# Patient Record
Sex: Female | Born: 1935 | Race: White | Hispanic: No | Smoking: Never smoker
Health system: Southern US, Community
[De-identification: ages and names within clinical notes are randomized; demographics above are authoritative.]

## PROBLEM LIST (undated history)

## (undated) DIAGNOSIS — N183 Chronic kidney disease, stage 3 unspecified: Secondary | ICD-10-CM

## (undated) DIAGNOSIS — R0602 Shortness of breath: Secondary | ICD-10-CM

## (undated) DIAGNOSIS — E782 Mixed hyperlipidemia: Secondary | ICD-10-CM

## (undated) DIAGNOSIS — M51369 Other intervertebral disc degeneration, lumbar region without mention of lumbar back pain or lower extremity pain: Secondary | ICD-10-CM

## (undated) DIAGNOSIS — M5136 Other intervertebral disc degeneration, lumbar region: Secondary | ICD-10-CM

## (undated) DIAGNOSIS — Z8679 Personal history of other diseases of the circulatory system: Secondary | ICD-10-CM

## (undated) DIAGNOSIS — D6851 Activated protein C resistance: Secondary | ICD-10-CM

## (undated) DIAGNOSIS — K589 Irritable bowel syndrome without diarrhea: Secondary | ICD-10-CM

## (undated) DIAGNOSIS — Z9889 Other specified postprocedural states: Secondary | ICD-10-CM

## (undated) DIAGNOSIS — B009 Herpesviral infection, unspecified: Secondary | ICD-10-CM

## (undated) DIAGNOSIS — D649 Anemia, unspecified: Secondary | ICD-10-CM

## (undated) DIAGNOSIS — M8580 Other specified disorders of bone density and structure, unspecified site: Secondary | ICD-10-CM

## (undated) DIAGNOSIS — K5790 Diverticulosis of intestine, part unspecified, without perforation or abscess without bleeding: Secondary | ICD-10-CM

## (undated) DIAGNOSIS — M48061 Spinal stenosis, lumbar region without neurogenic claudication: Secondary | ICD-10-CM

## (undated) DIAGNOSIS — I10 Essential (primary) hypertension: Secondary | ICD-10-CM

## (undated) DIAGNOSIS — K219 Gastro-esophageal reflux disease without esophagitis: Secondary | ICD-10-CM

## (undated) DIAGNOSIS — M1990 Unspecified osteoarthritis, unspecified site: Secondary | ICD-10-CM

## (undated) DIAGNOSIS — K449 Diaphragmatic hernia without obstruction or gangrene: Secondary | ICD-10-CM

## (undated) DIAGNOSIS — R7303 Prediabetes: Secondary | ICD-10-CM

## (undated) DIAGNOSIS — R112 Nausea with vomiting, unspecified: Secondary | ICD-10-CM

## (undated) DIAGNOSIS — R42 Dizziness and giddiness: Secondary | ICD-10-CM

## (undated) DIAGNOSIS — R0600 Dyspnea, unspecified: Secondary | ICD-10-CM

## (undated) DIAGNOSIS — Z862 Personal history of diseases of the blood and blood-forming organs and certain disorders involving the immune mechanism: Secondary | ICD-10-CM

## (undated) HISTORY — PX: EYE SURGERY: SHX253

## (undated) HISTORY — PX: COLONOSCOPY: SHX174

## (undated) HISTORY — DX: Unspecified osteoarthritis, unspecified site: M19.90

## (undated) HISTORY — DX: Personal history of other diseases of the circulatory system: Z86.79

## (undated) HISTORY — DX: Other intervertebral disc degeneration, lumbar region without mention of lumbar back pain or lower extremity pain: M51.369

## (undated) HISTORY — DX: Shortness of breath: R06.02

## (undated) HISTORY — DX: Personal history of diseases of the blood and blood-forming organs and certain disorders involving the immune mechanism: Z86.2

## (undated) HISTORY — DX: Essential (primary) hypertension: I10

## (undated) HISTORY — DX: Other specified disorders of bone density and structure, unspecified site: M85.80

## (undated) HISTORY — DX: Diverticulosis of intestine, part unspecified, without perforation or abscess without bleeding: K57.90

## (undated) HISTORY — PX: TOTAL KNEE ARTHROPLASTY: SHX125

## (undated) HISTORY — DX: Diaphragmatic hernia without obstruction or gangrene: K44.9

## (undated) HISTORY — DX: Dizziness and giddiness: R42

## (undated) HISTORY — DX: Chronic kidney disease, stage 3 unspecified: N18.30

## (undated) HISTORY — DX: Spinal stenosis, lumbar region without neurogenic claudication: M48.061

## (undated) HISTORY — DX: Herpesviral infection, unspecified: B00.9

## (undated) HISTORY — DX: Gastro-esophageal reflux disease without esophagitis: K21.9

## (undated) HISTORY — DX: Mixed hyperlipidemia: E78.2

## (undated) HISTORY — PX: DENTAL SURGERY: SHX609

## (undated) HISTORY — DX: Prediabetes: R73.03

## (undated) HISTORY — DX: Chronic kidney disease, stage 3 (moderate): N18.3

## (undated) HISTORY — DX: Other intervertebral disc degeneration, lumbar region: M51.36

---

## 1998-07-22 ENCOUNTER — Ambulatory Visit (HOSPITAL_COMMUNITY): Admission: RE | Admit: 1998-07-22 | Discharge: 1998-07-22 | Payer: Self-pay | Admitting: Family Medicine

## 1998-07-23 ENCOUNTER — Encounter: Payer: Self-pay | Admitting: Family Medicine

## 1998-08-22 ENCOUNTER — Other Ambulatory Visit: Admission: RE | Admit: 1998-08-22 | Discharge: 1998-08-22 | Payer: Self-pay | Admitting: Obstetrics and Gynecology

## 1998-09-18 ENCOUNTER — Encounter: Payer: Self-pay | Admitting: Obstetrics and Gynecology

## 1998-09-24 ENCOUNTER — Ambulatory Visit (HOSPITAL_COMMUNITY): Admission: RE | Admit: 1998-09-24 | Discharge: 1998-09-24 | Payer: Self-pay | Admitting: Obstetrics and Gynecology

## 1999-09-04 ENCOUNTER — Ambulatory Visit (HOSPITAL_COMMUNITY): Admission: RE | Admit: 1999-09-04 | Discharge: 1999-09-04 | Payer: Self-pay | Admitting: Family Medicine

## 1999-09-04 ENCOUNTER — Encounter: Payer: Self-pay | Admitting: Family Medicine

## 1999-10-02 ENCOUNTER — Other Ambulatory Visit: Admission: RE | Admit: 1999-10-02 | Discharge: 1999-10-02 | Payer: Self-pay | Admitting: Obstetrics and Gynecology

## 2000-09-06 ENCOUNTER — Encounter: Payer: Self-pay | Admitting: Family Medicine

## 2000-09-06 ENCOUNTER — Ambulatory Visit (HOSPITAL_COMMUNITY): Admission: RE | Admit: 2000-09-06 | Discharge: 2000-09-06 | Payer: Self-pay | Admitting: Family Medicine

## 2000-10-07 ENCOUNTER — Other Ambulatory Visit: Admission: RE | Admit: 2000-10-07 | Discharge: 2000-10-07 | Payer: Self-pay | Admitting: Obstetrics and Gynecology

## 2001-04-27 ENCOUNTER — Ambulatory Visit (HOSPITAL_COMMUNITY): Admission: RE | Admit: 2001-04-27 | Discharge: 2001-04-27 | Payer: Self-pay | Admitting: *Deleted

## 2001-09-27 ENCOUNTER — Encounter: Payer: Self-pay | Admitting: Family Medicine

## 2001-09-27 ENCOUNTER — Ambulatory Visit (HOSPITAL_COMMUNITY): Admission: RE | Admit: 2001-09-27 | Discharge: 2001-09-27 | Payer: Self-pay | Admitting: Family Medicine

## 2001-11-14 ENCOUNTER — Other Ambulatory Visit: Admission: RE | Admit: 2001-11-14 | Discharge: 2001-11-14 | Payer: Self-pay | Admitting: Obstetrics and Gynecology

## 2001-12-07 ENCOUNTER — Encounter: Payer: Self-pay | Admitting: Obstetrics and Gynecology

## 2001-12-07 ENCOUNTER — Encounter: Admission: RE | Admit: 2001-12-07 | Discharge: 2001-12-07 | Payer: Self-pay | Admitting: Obstetrics and Gynecology

## 2002-09-28 ENCOUNTER — Encounter: Payer: Self-pay | Admitting: Family Medicine

## 2002-09-28 ENCOUNTER — Ambulatory Visit (HOSPITAL_COMMUNITY): Admission: RE | Admit: 2002-09-28 | Discharge: 2002-09-28 | Payer: Self-pay | Admitting: Family Medicine

## 2003-10-17 ENCOUNTER — Ambulatory Visit (HOSPITAL_COMMUNITY): Admission: RE | Admit: 2003-10-17 | Discharge: 2003-10-17 | Payer: Self-pay | Admitting: Family Medicine

## 2004-04-03 ENCOUNTER — Other Ambulatory Visit: Admission: RE | Admit: 2004-04-03 | Discharge: 2004-04-03 | Payer: Self-pay | Admitting: Gynecology

## 2004-11-05 ENCOUNTER — Ambulatory Visit (HOSPITAL_COMMUNITY): Admission: RE | Admit: 2004-11-05 | Discharge: 2004-11-05 | Payer: Self-pay | Admitting: Family Medicine

## 2005-12-03 ENCOUNTER — Ambulatory Visit (HOSPITAL_COMMUNITY): Admission: RE | Admit: 2005-12-03 | Discharge: 2005-12-03 | Payer: Self-pay | Admitting: Family Medicine

## 2006-01-20 ENCOUNTER — Ambulatory Visit (HOSPITAL_COMMUNITY): Admission: RE | Admit: 2006-01-20 | Discharge: 2006-01-21 | Payer: Self-pay | Admitting: Orthopedic Surgery

## 2006-01-20 ENCOUNTER — Encounter (INDEPENDENT_AMBULATORY_CARE_PROVIDER_SITE_OTHER): Payer: Self-pay | Admitting: Specialist

## 2006-04-05 ENCOUNTER — Ambulatory Visit (HOSPITAL_COMMUNITY): Admission: RE | Admit: 2006-04-05 | Discharge: 2006-04-05 | Payer: Self-pay | Admitting: Orthopedic Surgery

## 2006-04-09 ENCOUNTER — Ambulatory Visit (HOSPITAL_COMMUNITY): Admission: RE | Admit: 2006-04-09 | Discharge: 2006-04-09 | Payer: Self-pay | Admitting: Family Medicine

## 2006-12-21 ENCOUNTER — Ambulatory Visit (HOSPITAL_COMMUNITY): Admission: RE | Admit: 2006-12-21 | Discharge: 2006-12-21 | Payer: Self-pay | Admitting: Family Medicine

## 2007-01-13 ENCOUNTER — Other Ambulatory Visit: Admission: RE | Admit: 2007-01-13 | Discharge: 2007-01-13 | Payer: Self-pay | Admitting: Gynecology

## 2007-12-23 ENCOUNTER — Ambulatory Visit (HOSPITAL_COMMUNITY): Admission: RE | Admit: 2007-12-23 | Discharge: 2007-12-23 | Payer: Self-pay | Admitting: Family Medicine

## 2008-05-15 ENCOUNTER — Inpatient Hospital Stay (HOSPITAL_COMMUNITY): Admission: RE | Admit: 2008-05-15 | Discharge: 2008-05-18 | Payer: Self-pay | Admitting: Orthopedic Surgery

## 2008-08-28 ENCOUNTER — Encounter: Admission: RE | Admit: 2008-08-28 | Discharge: 2008-08-28 | Payer: Self-pay | Admitting: Urology

## 2008-10-24 ENCOUNTER — Ambulatory Visit (HOSPITAL_COMMUNITY): Admission: RE | Admit: 2008-10-24 | Discharge: 2008-10-24 | Payer: Self-pay | Admitting: Urology

## 2009-01-03 ENCOUNTER — Encounter (INDEPENDENT_AMBULATORY_CARE_PROVIDER_SITE_OTHER): Payer: Self-pay | Admitting: Urology

## 2009-01-03 ENCOUNTER — Inpatient Hospital Stay (HOSPITAL_COMMUNITY): Admission: RE | Admit: 2009-01-03 | Discharge: 2009-01-05 | Payer: Self-pay | Admitting: Urology

## 2009-01-03 HISTORY — PX: KIDNEY SURGERY: SHX687

## 2009-04-12 ENCOUNTER — Ambulatory Visit (HOSPITAL_COMMUNITY): Admission: RE | Admit: 2009-04-12 | Discharge: 2009-04-12 | Payer: Self-pay | Admitting: Family Medicine

## 2010-12-21 ENCOUNTER — Encounter: Payer: Self-pay | Admitting: Gynecology

## 2010-12-21 ENCOUNTER — Encounter: Payer: Self-pay | Admitting: Family Medicine

## 2010-12-22 ENCOUNTER — Encounter: Payer: Self-pay | Admitting: Urology

## 2011-03-17 LAB — BASIC METABOLIC PANEL
CO2: 28 mEq/L (ref 19–32)
CO2: 29 mEq/L (ref 19–32)
Calcium: 8 mg/dL — ABNORMAL LOW (ref 8.4–10.5)
Calcium: 9.3 mg/dL (ref 8.4–10.5)
Chloride: 105 mEq/L (ref 96–112)
Creatinine, Ser: 0.95 mg/dL (ref 0.4–1.2)
Creatinine, Ser: 0.98 mg/dL (ref 0.4–1.2)
GFR calc Af Amer: >60 mL/min (ref >60)
GFR calc Af Amer: >60 mL/min (ref >60)
Glucose, Bld: 111 mg/dL — ABNORMAL HIGH (ref 70–99)
Potassium: 4 mEq/L (ref 3.5–5.1)
Sodium: 134 mEq/L — ABNORMAL LOW (ref 135–145)

## 2011-03-17 LAB — CBC
HCT: 38.4 % (ref 36.0–46.0)
MCV: 88.2 fL (ref 78.0–100.0)
Platelets: 216 10*3/uL (ref 150–400)
RBC: 4.35 MIL/uL (ref 3.87–5.11)

## 2011-03-17 LAB — HEMOGLOBIN AND HEMATOCRIT, BLOOD
HCT: 34.3 % — ABNORMAL LOW (ref 36.0–46.0)
Hemoglobin: 10.9 g/dL — ABNORMAL LOW (ref 12.0–15.0)
Hemoglobin: 11.8 g/dL — ABNORMAL LOW (ref 12.0–15.0)

## 2011-03-17 LAB — TYPE AND SCREEN
ABO/RH(D): O NEG
Antibody Screen: NEGATIVE

## 2011-03-17 LAB — CREATININE, FLUID (PLEURAL, PERITONEAL, JP DRAINAGE): Creat, Fluid: 1 mg/dL

## 2011-04-14 NOTE — Discharge Summary (Signed)
[name], [name]                 ACCOUNT NO.:  [id number]   MEDICAL RECORD NO.:  [id number]          PATIENT TYPE:  INP   LOCATION:  1611                         FACILITY:  [facility]   PHYSICIAN:  Marlowe Kays, M.D.  DATE OF BIRTH:  04/09/1931   DATE OF ADMISSION:  05/15/2008  DATE OF DISCHARGE:  05/18/2008                               DISCHARGE SUMMARY   ADMITTING DIAGNOSES:  1. Severe osteoarthritis of the right knee.  2. Hypertension.  3. Dyslipidemia.  4. Reflux.  5. Irritable bowel syndrome.   DISCHARGE DIAGNOSES:  1. Severe osteoarthritis of the right knee.  2. Hypertension.  3. Dyslipidemia.  4. Reflux.  5. Irritable bowel syndrome.  6. Mild postoperative anemia.  7. Episode of hypokalemia (potassium depletion at 2.6) resolved.   OPERATION:  On May 15, 2008 the patient underwent Osteonics, total  replacement arthroplasty of the right knee.   ASSISTANT:  Dooley L. Idolina Primer, P.A.-C   BRIEF HISTORY:  This 77 year old lady had continued progressive problems  concerning both of knees.  Steroid injections as well as anti-  inflammatories really did not help her.  X-rays showed bone-on-bone  deformity with severe tricompartmental arthritis.  After much  discussion, including the risks and benefits of surgery, it was decided  to go ahead with the above procedure.   COURSE IN THE HOSPITAL:  The patient tolerated the surgical procedure  quite well.  She worked with physical therapy for the total knee  protocol.  Hemovac was pulled without any difficulty, and dressing  change was performed showing the wound to be clean and dry and very  little swelling.  Neurovascularly he had remained intact in the right  lower extremity.  As the patient was unable to retake any of the  codones, for this level of pain, she was given Dilaudid and then  continued on that throughout the hospitalization.  She did quite well  with that.   On the second postop day, it was noted that her on  potassium was  depleted down to 2.6 and sodium was 133.  We stopped her  antihypertensive medication which contained a diuretic and gave  potassium 40 mg p.o. b.i.d.  This brought her potassium back up to 3.5  with the sodium of 135.  She is encouraged to see her family physician  for followup.  This was told both to her, and to her daughter.  Her  family physician is Dr. Aida Puffer in [place].   On the day of discharge the patient was ambulating in the hall. No  followup therapy was indicated from the therapist, as she was doing so  well.  She was able to achieve stairs and ADLs without problem.  Home  health has been arranged, and we will discharge her to her home  environment.   LABORATORY VALUES:  In the hospital hematologically showed a CBC,  preoperatively, which essentially was within normal limits.  Final  hemoglobin 8.4, hematocrit was 24.4.  Final chemistries were within  normal limits; again, with the potassium at 3.5 and sodium at 135.  Urinalysis was negative.  Chest x-ray:  Stable with no active lung  disease and mild peribronchial.  Electrocardiogram:  Sinus rhythm with  premature atrial complexes.  CT of the chest which was done for right  lower lobe question of findings:  No evidence for right lower lobe or  left perihilar nodules or infiltrates.  No acute findings.  The left  parahilar nodule, seen on prior study, consistent with vessels seen on  ends.   CONDITION ON DISCHARGE:  Improved, stable.   PLAN:  Again, the patient is urged to contact Dr. Fredirick Maudlin office to be  sure that he is aware that we have stopped her diuretic,  antihypertensive medication, to prevent any further hypokalemia.  He may  want to resume that with potassium, and will certainly leave it in his  hands.   She is to return to see [place] back about 2 weeks some after surgery.  Continue with her home diet as she had prior to surgery.  Use dry  dressings to the knee on an as-needed basis, and to  weight bear as  tolerated.      Dooley L. Cherlynn June.    ______________________________  Marlowe Kays, M.D.    DLU/MEDQ  D:  05/18/2008  T:  05/18/2008  Job:  [id number]   cc:   Aida Puffer  Fax: [phone]

## 2011-04-14 NOTE — Op Note (Signed)
[name], [name]                 ACCOUNT NO.:  [id number]   MEDICAL RECORD NO.:  [id number]          PATIENT TYPE:  INP   LOCATION:  1429                         FACILITY:  [facility]   PHYSICIAN:  Heloise Purpura, MD      DATE OF BIRTH:  01-08-1931   DATE OF PROCEDURE:  01/03/2009  DATE OF DISCHARGE:                               OPERATIVE REPORT   PREOPERATIVE DIAGNOSIS:  Left renal mass.   POSTOPERATIVE DIAGNOSIS:  Left renal mass.   PROCEDURE:  Left robotic assisted laparoscopic partial nephrectomy.   SURGEON:  Dr. Heloise Purpura.   ASSISTANT:  Delia Chimes, nurse practitioner.   ANESTHESIA:  General.   COMPLICATIONS:  Injury of left renal vein which was repaired  intraoperatively.   ESTIMATED BLOOD LOSS:  200 mL.   INTRAVENOUS FLUIDS:  1800 mL of lactated Ringer's.   SPECIMENS:  1. Left renal mass.  2. Left renal tumor margin.   INTRAOPERATIVE FINDINGS:  1. Warm ischemia time 24 minutes.  2. Intraoperative tumor margin was negative for malignancy.   DISPOSITION OF SPECIMENS:  To pathology.   DRAINS:  1. #15 Blake perinephric drain.  2. A 16-French Foley catheter.   INDICATION:  Ms. Ahlgren is a 77 year old female who was found to have an  incidentally detected enhancing left renal mass concerning for a  possible renal malignancy.  She initially underwent surveillance of this  mass and on follow-up imaging it did not appear significantly changed.  However, the patient did wish to eventually proceed with definitive  treatment.  She underwent metastatic evaluation which was negative.  The  potential risks, complications, and alternative treatment options  associated with the above procedure were discussed in detail with the  patient and informed consent was obtained.   DESCRIPTION OF PROCEDURE:  The patient was taken to the operating room  and a general anesthetic was administered.  She was given preoperative  antibiotics, placed in the left modified flank position,  and prepped and  draped in the usual sterile fashion.  Next, preoperative time-out was  performed.  A site was selected in the left upper quadrant just lateral  to the umbilicus for placement of the camera port.  This was placed  using a standard open Hasson technique, which allowed entry in the  peritoneal cavity under direct vision without difficulty.  A 12 mm port  was then placed and a pneumoperitoneum established.  A 0 degree lens was  used to inspect the abdomen there were noted be some adhesions between  the omentum and the abdominal wall in the left upper quadrant.  There  was no evidence for any intra-abdominal injuries or other abnormalities.  The remaining ports were then placed.  8 mm robotic ports were placed in  the left upper quadrant, left lower quadrant, and far left lower  quadrant.  A 12 mm port was placed in the upper midline for laparoscopic  assistance.  All ports were placed under direct vision without  difficulty.  The surgical cart was then docked.  With the aid of the  cautery scissors, the aforementioned adhesions  between the omentum and  abdominal wall were sharply taken down.  The white line of Toldt along  the length of the descending colon was then incised allowing the colon  to be mobilized medially and the space between the mesocolon and the  anterior layer of Gerota's fascia to be developed.  The ureter and  gonadal vein were identified and were lifted anteriorly off psoas  muscle.  There was noted to be a very large amount of perinephric fat in  the retroperitoneal space.  The gonadal vein was divided between  multiple 5-mm Hem-o-lok clips.  The gonadal vein was followed up to the  main renal hilum where the main renal vein was identified.  It was  carefully isolated with a combination of sharp and blunt dissection.  On  preoperative imaging, the patient's renal artery appeared to be just  posterior and superior to the renal vein, although it did extend  in a  somewhat diagonal direction toward the upper pole of the kidney.  An  initial attempt was made to identify the takeoff of the artery from the  aorta just on the inferior side of the renal vein.  During this  dissection, there was a small inadvertent opening made in the renal  vein.  This was quickly controlled and minimal blood loss occurred.  This was then repaired with a figure-of-eight 4-0 Prolene suture which  resulted in excellent hemostasis.  Attention then turned to the superior  aspect of the renal vein and the renal artery was identified more  distally from its takeoff at the aorta.  It was able to be isolated in  preparation for renal hilar clamping.  12.5 grams of intravenous  mannitol was then administered.  Attention then turned to the kidney.  The patient's tumor was noted on preoperative imaging to be on the  anterior aspect of the interpolar region of the left kidney.  The  perinephric fat from this region of the kidney was removed and the  patient's renal tumor was easily identified and was then isolated from  the surrounding perinephric fat with a clear plane of renal capsule  around the renal tumor.  Once the renal tumor was adequately isolated  and the mannitol administered for an appropriate amount of time,  attention returned to the renal hilum.  The renal artery was then  clamped with a laparoscopic bulldog clamp.  The tumor was then excised  with sharp cold scissor dissection along with underlying normal renal  parenchyma.  An additional margin was taken from the base of the  resection and sent for intraoperative frozen section analysis and was  negative for malignancy.  Attention then turned to reconstruction of the  renal defect.  A 2-0 Vicryl running suture was used to provide  hemostasis.  The defect was not particularly deep due to the exophytic  nature of the kidney and therefore there was minimal concern of entry  into the collecting system.  At this  point a Surgicel bolster was placed  in the defect and secured with 2-0 Vicryl capsular sutures.  Before  these were secured down and renal compression obtained, FloSeal was  placed into the defect.  The Monocryl sutures were then secured with a  combination of Hem-o-lok clips and Lapra-Ty's to secure them in place  after renal compression of the defect over the Surgicel bolster.  Attention then returned to the renal hilum and the laparoscopic bulldog  clamp was removed.  Total warm ischemia time was  24 minutes.  The renal  hilum was reexamined and there appeared to be excellent hemostasis.  The  repair of the renal vein was also examined and appeared to be secure.  Additional FloSeal was placed over the repair of the renal vein.  Attention then returned to the tumor resection site and additional  FloSeal was placed over the tumor site.  Hemostasis remained excellent.  The perinephric fat was then reapproximated over the defect in the  kidney.  A #15 Blake drain was brought through the far right lower  quadrant port and placed in the perinephric space and secured to the  skin with a nylon suture.  The tumor specimen had been placed into an  Endopouch retrieval bag and was removed via the Hasson camera port  incision.  All remaining ports were removed under direct vision and the  left upper quadrant was then reexamined with the pneumoperitoneum let  down.  Hemostasis remained excellent and preparations were made for  closure.  All ports were removed under direct vision after a 0 Vicryl  suture had been preplaced through the 12 mm upper midline port site for  subsequent closure of this fascial opening.  The camera port site was  closed with a running 0 Vicryl suture.  All port sites once the 12 mm  ports were closed, were then injected with 0.25% Marcaine and  reapproximated at the skin level with 4-0  Monocryl subcuticular sutures.  Dermabond was placed over the incision  sites.  The  patient appeared to tolerate the procedure well without  complications.  She was able to be awakened and transferred to the  recovery unit in satisfactory condition.      Heloise Purpura, MD  Electronically Signed     LB/MEDQ  D:  01/03/2009  T:  01/04/2009  Job:  [id number]   cc:   [facility], [place]

## 2011-04-14 NOTE — Op Note (Signed)
[name], [name]                 ACCOUNT NO.:  [id number]   MEDICAL RECORD NO.:  [id number]          PATIENT TYPE:  INP   LOCATION:  0004                         FACILITY:  [facility]   PHYSICIAN:  Marlowe Kays, M.D.  DATE OF BIRTH:  [birth year]   DATE OF PROCEDURE:  05/15/2008  DATE OF DISCHARGE:                               OPERATIVE REPORT   PREOPERATIVE DIAGNOSIS:  Osteoarthritis, right knee.   POSTOPERATIVE DIAGNOSIS:  Osteoarthritis, right knee.   OPERATION:  Osteonics total knee replacement, right.   SURGEON:  Marlowe Kays, M.D.   ASSISTANTDruscilla Brownie. Cherlynn June.   ANESTHESIA:  Spinal.   PATHOLOGY:  She has bone-on-bone abutment medially with some slight  subluxation of the femur medial on the tibia.  She also has a similar  deformity in her left knee but her right knee is the one that is more  symptomatic.  Because of the significant varus deformity, I elected to  place a tibial extension to minimize the risk of recurrent varus.   PROCEDURE:  Prophylactic antibiotics.  Satisfactory spinal anesthesia.  Foley catheter inserted.  Pneumatic tourniquet, Sure Foot and lateral  hip stabilizer.  Right leg was prepped with DuraPrep from tourniquet to  ankle and draped in sterile field.  Ioban employed.  Time-out performed.  Vertical midline incision down to the patellar mechanism with median  parapatellar incision to open the joint.  The pes anserinus and the  medial collateral ligament were undermined off the proximal medial  tibia.  The patellar mechanism was freed up and patella everted and the  knee flexed.  Osteophytes were removed from around the patella and the  proximal femur.  She had a good bit of inflammatory tissue, which I  removed from the suprapatellar area, sized the patella at a 26.  Placed  a 5/16-inch drill hole in the proximal femur.  Followed this by the  canal finder and the axis aligner set for a 5-degree valgus cut the  right knee.  She had no  flexion contracture and I made a 10-mm distal  femoral cut.  I then placed the jig for sizing her femur and a 9 was the  best fit.  Scribe holes were placed and the distal femoral cutting jig  was applied and appropriate cuts made.  I then made a leveling cut on  her tibia and we sized the tibia to size 7.  Using the baseplate, I made  my initial intramedullary drill hole followed by the step-cut drill and  the canal finder.  I then placed the intermedullary rod, setting it for  a 4-mm cut off the slightly-depressed medial tibial plateau.  My initial  proximal tibial cut was made along with it, removing the remnants of  both menisci.  I then placed a lamina spreader and I removed the soft  tissue and some residual bone off the posterior femoral condyles.  The  jig for creating both the patellar groove and the notchplasty was then  attached to the femur and the patellar groove was made.  I then followed  this by the  notchplasty.  I then went through a trial reduction and  found that the 10-mm spacer fit nicely with excellent extension and good  stability.  I used the external aligning rod to split the bimalleolar  distance and the patellar tray was stable at this location.  With the  cautery and then a marking pen, I used the scribe lines on the anterior  tibial tray, placing them on the tibia and then for use later.  While  the knee was in extension, I then used the patellar cutting guide with a  10-mm recess cut made, followed by the guide for drilling the three  fixation patellar holes.  A trial 26 patella was then placed and excess  bone around the patella button was removed with a small rongeur.  I then  returned to the tibia.  The baseplate was placed based on the previous  scribe lines, stabilizing the three pins.  I then used the tripod  apparatus to ream for the keel up to a 7 cemented.  I then used a boss  reamer to slightly enlarge the canal and I reamed up to a 12-mm, 80-mm   stem for the tibial extension piece.  I then went through a trial  reduction with tibial baseplate and stem and it fit nicely.  Accordingly, we then went ahead and water-picked the knee while the  tibial component was assembled and methyl methacrylate was then mixed.  I then began gluing in the components, starting first the tibia and  gluing in the baseplate but not the extension.  We impacted this and  trimmed up excess methyl methacrylate.  We then went to the femur and  glued in the femoral component, removing excess methyl methacrylate, and  held the knee in extension with a 10-mm spacer while we glued in the  patella holding it with the patellar clamp.  When the methacrylate had  hardened, we trimmed up excess methyl methacrylate from around the  components and went through once again a trial reduction and found that  the 10-mm spacer was ideal.  Consequently, after the checking tray once  again and irrigating the wound well, I placed the final 10-mm posterior-  stabilized size 7 spacer and I reduced the knee, which was nice and  stable with excellent motion.  No lateral patellar release was required.  A Hemovac was placed.  The wound was closed in layers with interrupted  #1 Vicryl in two layers in the quadriceps tendon and distally in the  synovium and capsule.  Subcutaneous tissue was closed with a combination  of #1 and 2-0 Vicryl, staples in the skin.  Betadine, Adaptic dry  sterile dressing were applied.  Tourniquet was released with 1 hour 41  minutes of tourniquet time at 325 mmHg.  She tolerated the procedure  well and was taken to the room in satisfactory condition with no known  complications and no blood loss.           ______________________________  Marlowe Kays, M.D.     JA/MEDQ  D:  05/15/2008  T:  05/15/2008  Job:  [id number]

## 2011-04-14 NOTE — H&P (Signed)
[name], [name]                 ACCOUNT NO.:  [id number]   MEDICAL RECORD NO.:  [id number]          PATIENT TYPE:  INP   LOCATION:  NA                           FACILITY[facility]  [facility]   PHYSICIAN:  Marlowe Kays, M.D.  DATE OF BIRTH:  05/17/1931   DATE OF ADMISSION:  05/15/2008  DATE OF DISCHARGE:                              HISTORY & PHYSICAL   CHIEF COMPLAINT:  Pain in my knees, more so on my right than the left.   PRESENT ILLNESS:  This 77 year old white female was seen by [place] for  continued progressive problems concerning her knees.  She has also seen  [place] for her back problem, but her recent visits have been primarily for  her knee.  We have tried nonsteroidal antiinflammatories in the past for  her knees, but it does not seem to help it.  We were considering a  steroid injection, but since she in all probability would need knee  surgery, it was contraindicated.  X-rays have shown rather severe  deterioration of the joint with near bone-on-bone arthritis.  Compared  with older films, it shows that she has no articular space remaining in  the right knee compared to films about 2 years ago.  After much  consideration, including the risks and benefits of surgery, decided to  ahead with total knee replacement arthroplasty.   This will be done on the right as it is the most symptomatic knee.  She  has had knee arthroscopies on the left, which has provided her with some  relief.   This is a very active lady.  She goes on cruises and is independent in  her own home with a large and caring family.   PAST MEDICAL HISTORY:  Dr. Aida Puffer at [facility] in  [place], [place] is her family physician.  He has treated her for  hypertension, irritable bowel syndrome and intermittent urinary tract  infections.  In the past, she has had a history of pneumonia, rheumatic  fever in 1949, resulting in a heart murmur in 1949 as well.  She has a  mild hiatal hernia.  She also has  hemorrhoids.  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  Her last MRI was November 2008.   ALLERGIES:  SHE IS ALLERGIC TO CODEINE, BUT VICODIN IS ALL RIGHT FOR  DISCOMFORT, BUT SHE CANNOT TAKE PERCOCET.  SHE IS ALSO ALLERGIC TO  SULFA, AND SHE HAS A HIGH SENSITIVITY TO ADHESIVE TAPE, BUT PAPER TAPE  CAN BE USED.  SHE HAS NO FOOD NOR LATEX NOR METAL ALLERGIES.   CURRENT MEDICATIONS:  1. Triam/HCTZ caps 37.5/25 mg daily.  2. Celebrex caps 200 mg daily (stopped this on May 05, 2008).  3. She also stopped the Vitamin E, Vitamin D3, folic acid and her      aspirin.  4. She takes Prilosec 20.6 mg daily.   PAST SURGERIES:  Bilateral cataract surgeries in August and September,  and she had a laser done for cloudiness in  the left in June 2009.  Left  knee arthroscopy in 2007, as well as in 1993.  She had teeth  extractions, as well as gum surgery in 1988.   FAMILY HISTORY:  Positive for a heart attack in the father at 44 years  of age.  The mother had Parkinson's, died at 63 years of age, as well as  pneumonia.  She has a sister with DVTs and PEs, which she describes it  as being a familial genetic situation even though the patient has never  been bothered with it.   SOCIAL HISTORY:  The patient is widowed.  She has never had any intake  of alcohol or tobacco products.  She has four children.  She lives alone  in her own home and does quite well.   REVIEW OF SYSTEMS:  CNS:  No seizure, shoulder paralysis, numbness or  double vision.  RESPIRATORY:  No productive cough, no hemoptysis, no shortness of  breath.  CARDIOVASCULAR:  No chest pain, no angina, no orthopnea.  GASTROINTESTINAL:  No nausea, vomiting, melena or bloody stools.  As  long as she takes her medications, she does quite well with her reflux,  as well as her irritable bowel syndrome, which is mainly controlled with  diet.  GENITOURINARY:  Currently no  discharge, dysuria or hematuria, but she  states she does have intermittent urinary tract infections.   PHYSICAL EXAMINATION:  GENERAL:  Alert and cooperative friendly 76-year-  old white female who is accompanied by her daughter.  VITAL SIGNS:  Blood pressure 160/82 seated, left arm, pulse is 80 and  regular, respirations are 12.  HEENT:  Normocephalic.  PERRLA.  Oropharynx is clear.  There is upper and lower dental plates.  NECK:  Supple.  No lymphadenopathy.  CHEST:  Clear to auscultation.  No rhonchi or rales.  No wheezes.  HEART:  Regular rate and rhythm.  There is a very faint grade 2/6 murmur  at the left sternal border.  ABDOMEN:  Obese, soft and nontender.  Liver  and spleen not felt.  GENITALIA/RECTAL:  Not done, not pertinent to present illness.  EXTREMITIES:  She has crepitus with range of motion of both knees, more  so on the right than the left.   ADMITTING DIAGNOSES:  1. End-stage osteoarthritis of the right knee.  2. Hypertension.  3. Dyslipidemia.  4. Reflux.  5. Irritable bowel syndrome.   PLAN:  The patient to undergo a right knee arthroplasty.  I have told  her and her daughter she will be on Coumadin for 4 weeks after the date  of surgery.  In all probability, she will go home with home physical  therapy.  Her daughter is planning to stay with her for 6 weeks in her  home.  After that, she may need outpatient physical therapy.  Will  certainly see how she gets along with her home health.  Today, all  questions were encouraged and answered.      Dooley L. Cherlynn June.    ______________________________  Marlowe Kays, M.D.    DLU/MEDQ  D:  05/09/2008  T:  05/09/2008  Job:  [id number]   cc:   Aida Puffer, MD  [facility]  [place], [place]

## 2011-04-17 NOTE — Discharge Summary (Signed)
[name], [name]                 ACCOUNT NO.:  [id number]   MEDICAL RECORD NO.:  [id number]          PATIENT TYPE:  INP   LOCATION:  1429                         FACILITY:  [facility]   PHYSICIAN:  Heloise Purpura, MD      DATE OF BIRTH:  07-05-1931   DATE OF ADMISSION:  01/03/2009  DATE OF DISCHARGE:  01/05/2009                               DISCHARGE SUMMARY   ADMISSION DIAGNOSIS:  Left renal mass.   DISCHARGE DIAGNOSIS:  Left renal mass.   PROCEDURES:  Left robotic assisted laparoscopic partial nephrectomy.   HISTORY AND PHYSICAL:  For full details please see admission history and  physical.  Briefly, [name] is a 77 year old female who was  incidentally found to have an enhancing left renal mass.  She underwent  metastatic evaluation which was negative and, after a thorough  discussion regarding management options for treatment, she elected to  proceed with a minimally invasive nephron-sparing surgical approach.   HOSPITAL COURSE:  On January 03, 2009, the patient was taken to the  operating room and underwent the above procedure.  She tolerated this  procedure well without complications.  Postoperatively, she was able to  be transferred to a regular hospital room following recovery from  anesthesia.  She was maintained on strict bedrest for the for 24 hours  after her operation.  She remained hemodynamically stable, and on the  morning of postoperative day #1, her hematocrit was found to be stable  at 32.1.  In addition, her renal function remained unchanged with a  creatinine of 0.98.  She began ambulating, which she did without much  difficulty.  Her diet was gradually advanced.  She did begin passing  flatus, although had some nausea on postoperative day #1.  Once her  bowel function did return and she began passing flatus, her diet was  advanced and she did tolerate this without difficulty.  She was  transitioned to oral pain medication and on the morning of postoperative  day #2, her perinephric drain fluid was sent for creatinine level and  found to be 1.0, consistent with serum.  Therefore, her drain was able  to be removed.  By the afternoon of postoperative day #2, she was  ambulating without difficulty, tolerating a regular diet, and her pain  was well-controlled with oral pain medication.  She was therefore felt  to be stable to be discharged home.   DISPOSITION:  Home.   DISCHARGE MEDICATIONS:  She was instructed to resume her regular home  medications.  She was given a prescription to take Darvocet as needed  for pain and a prescription for Colace to take as a stool softener.   DISCHARGE INSTRUCTIONS:  She was instructed to be ambulatory, but  specifically told to refrain from any heavy lifting, strenuous activity,  or driving.   FOLLOW UP:  She will plan to follow up as scheduled in 10 to 14 days for  further postoperative evaluation and to discuss her surgical pathology  report, which is pending at time of discharge.      Heloise Purpura, MD  Electronically  Signed     LB/MEDQ  D:  01/06/2009  T:  01/06/2009  Job:  [id number]

## 2011-04-17 NOTE — Op Note (Signed)
[name], [name]                 ACCOUNT NO.:  [id number]   MEDICAL RECORD NO.:  [id number]          PATIENT TYPE:  OIB   LOCATION:  1007                         FACILITY:  [facility]   PHYSICIAN:  Marlowe Kays, M.D.  DATE OF BIRTH:  May 07, 1931   DATE OF PROCEDURE:  01/20/2006  DATE OF DISCHARGE:  01/21/2006                                 OPERATIVE REPORT   PREOPERATIVE DIAGNOSES:  1.  Torn medial meniscus.  2.  Multiple loose bodies.  3.  Osteoarthritis, left knee.   POSTOPERATIVE DIAGNOSES:  1.  Torn medial meniscus.  2.  Multiple loose bodies.  3.  Osteoarthritis, left knee.   OPERATION:  Left knee arthroscopy with:  1.  Partial medial meniscectomy.  2.  Removal of 4 large loose bodies.  3.  Debridement of patella.  4.  Shaving of medial and lateral femoral condyles.   SURGEON:  Dr. Simonne Come   ASSISTANT:  Nurse.   ANESTHESIA:  General.   PATHOLOGY AND JUSTIFICATION FOR PROCEDURE:  I originally arthroscoped her  knee in 1995, and she has done well until this past fall with considerable  pain and swelling in the knee.  Plain x-rays demonstrated osteoarthritic  changes, but an MRI has demonstrated torn medial meniscus as well as  multiple synovial chondromatosis.  She is here today for the above-mentioned  surgery.   PROCEDURE:  Satisfactory general anesthesia with family history of DVT and  pulmonary emboli, I planned to place her on Lovenox 12 hours after surgery  but also initially did not inflate the tourniquet, thinking this might help  minimize DVT problems.  The left leg was prepped from thigh stabilizer to  ankle with DuraPrep, draped in a sterile field.  Ace wrap and knee support  for the right knee.  Superior and medial saline inflow, first through an  anterolateral portal, medial compartment of knee joint was evaluated.  Immediately apparent was fairly advanced chondromalacia of the medial  femoral condyle which I rated about 3/4 and some areas of  full-thickness  wear of the posterior medial tibial plateau and 2 large loose bodies in the  anterior and medial joint.  I had to enlarge the medial portal but was able  to get 1 loose body out intact.  The other came out in fragmented pieces.  She did have some intra-articular bleeding I corrected with the underwater  Bovie.  Posteriorly the medial meniscus tear was handled with a combination  of baskets and a 3.5 shaver until the remaining rim was smooth and stable.  I then looked up in the medial gutter and suprapatellar area.  She had  another fragment in the suprapatellar which I removed, and the patella was  quite worn, and I shaved it down until smooth partly from this portal but  partly under reverse portal technique.  On the reverse portal technique, I  found another loose body in the anterolateral joint, shaved also the lateral  femoral condyle with grade 2/4 chondromalacia.  The lateral meniscus I had  difficulty visualizing.  The MRI demonstrated no tear and near as I  can see,  with some difficulty, the posterior third, it appeared to be some roughened  but basically intact.  Then looked once again up in the lateral gutter and  suprapatellar area, found no additional loose fragments but then checked  medially through this portal as well, and everything looked clean.  The knee  joint was then irrigated until clear and all fluid possible removed.  The 2  anterior portals were closed with 4-0 nylon.  I then injected through the  inflow apparatus 20 mg of 0.5% Marcaine with adrenalin, 4 mg of morphine.  The inflow apparatus was removed and this portal closed with 4-0 nylon as  well.  Betadine, Adaptic dry sterile dressing were applied.  Tourniquet was  released.  She tolerated the procedure well and was taken to the recovery  room in satisfactory condition with no known complications.   ADDENDUM:  Early on in the case because of intraoperative bleeding so I  could not visualize the  knee, I did have to use an Esmarch and inflate the  tourniquet to 350 mmHg with 1 hour and 24 minutes of tourniquet time being  required for the case.           ______________________________  Marlowe Kays, M.D.     JA/MEDQ  D:  01/20/2006  T:  01/21/2006  Job:  [id number]

## 2011-05-28 ENCOUNTER — Other Ambulatory Visit: Payer: Self-pay | Admitting: Gynecology

## 2011-08-27 LAB — ABO/RH: ABO/RH(D): O NEG

## 2011-08-27 LAB — CBC
HCT: 36.7
HCT: 24.4 — ABNORMAL LOW
Hemoglobin: 12.6
Hemoglobin: 9.2 — ABNORMAL LOW
MCHC: 34.1
MCV: 86.3
Platelets: 313
Platelets: 196
Platelets: 200
RBC: 2.83 — ABNORMAL LOW
RBC: 3.11 — ABNORMAL LOW
RDW: 14.9
RDW: 14.3
WBC: 11.5 — ABNORMAL HIGH
WBC: 12.5 — ABNORMAL HIGH
WBC: 9.6

## 2011-08-27 LAB — BASIC METABOLIC PANEL
BUN: 10
BUN: 9
CO2: 28
CO2: 30
Calcium: 8.2 — ABNORMAL LOW
Calcium: 8.2 — ABNORMAL LOW
Chloride: 103
Creatinine, Ser: 1.12
Creatinine, Ser: 1.03
Creatinine, Ser: 1.09
GFR calc Af Amer: >60
GFR calc Af Amer: >60
GFR calc non Af Amer: 48 — ABNORMAL LOW
GFR calc non Af Amer: 49 — ABNORMAL LOW
GFR calc non Af Amer: 52 — ABNORMAL LOW
GFR calc non Af Amer: >60
Glucose, Bld: 102 — ABNORMAL HIGH
Glucose, Bld: 172 — ABNORMAL HIGH
Potassium: 3.9
Potassium: 3.3 — ABNORMAL LOW
Potassium: 3.5
Sodium: 133 — ABNORMAL LOW

## 2011-08-27 LAB — PROTIME-INR
INR: 1.1
INR: 1.5
Prothrombin Time: 14.7
Prothrombin Time: 18.2 — ABNORMAL HIGH
Prothrombin Time: 19.3 — ABNORMAL HIGH

## 2011-08-27 LAB — URINALYSIS, ROUTINE W REFLEX MICROSCOPIC
Bilirubin Urine: NEGATIVE
Glucose, UA: NEGATIVE
Ketones, ur: NEGATIVE
Leukocytes, UA: NEGATIVE
Protein, ur: NEGATIVE
pH: 6.5

## 2011-08-27 LAB — HEPATIC FUNCTION PANEL
Alkaline Phosphatase: 109
Bilirubin, Direct: 0.1
Indirect Bilirubin: 0.6
Total Bilirubin: 0.7

## 2011-08-27 LAB — URINE MICROSCOPIC-ADD ON

## 2011-08-27 LAB — DIFFERENTIAL
Basophils Absolute: 0
Eosinophils Absolute: 0
Eosinophils Relative: 1
Lymphocytes Relative: 28
Monocytes Absolute: 0.5

## 2011-08-27 LAB — TYPE AND SCREEN: Antibody Screen: NEGATIVE

## 2011-08-27 LAB — HEMOGLOBIN AND HEMATOCRIT, BLOOD: HCT: 36.7

## 2014-08-02 ENCOUNTER — Other Ambulatory Visit: Payer: Self-pay | Admitting: Family Medicine

## 2014-08-02 DIAGNOSIS — G4489 Other headache syndrome: Secondary | ICD-10-CM

## 2014-08-02 DIAGNOSIS — R42 Dizziness and giddiness: Secondary | ICD-10-CM

## 2014-08-03 ENCOUNTER — Ambulatory Visit
Admission: RE | Admit: 2014-08-03 | Discharge: 2014-08-03 | Disposition: A | Discharge status: Home / Self Care | Payer: Commercial Managed Care - HMO | Source: Ambulatory Visit | Attending: Family Medicine | Admitting: Family Medicine

## 2014-08-03 DIAGNOSIS — R42 Dizziness and giddiness: Secondary | ICD-10-CM

## 2014-08-03 DIAGNOSIS — G4489 Other headache syndrome: Secondary | ICD-10-CM

## 2014-12-31 DIAGNOSIS — J019 Acute sinusitis, unspecified: Secondary | ICD-10-CM | POA: Diagnosis not present

## 2014-12-31 DIAGNOSIS — Z6829 Body mass index (BMI) 29.0-29.9, adult: Secondary | ICD-10-CM | POA: Diagnosis not present

## 2014-12-31 DIAGNOSIS — B009 Herpesviral infection, unspecified: Secondary | ICD-10-CM | POA: Diagnosis not present

## 2014-12-31 DIAGNOSIS — R7309 Other abnormal glucose: Secondary | ICD-10-CM | POA: Diagnosis not present

## 2015-01-03 DIAGNOSIS — H01004 Unspecified blepharitis left upper eyelid: Secondary | ICD-10-CM | POA: Diagnosis not present

## 2015-01-03 DIAGNOSIS — H01002 Unspecified blepharitis right lower eyelid: Secondary | ICD-10-CM | POA: Diagnosis not present

## 2015-01-03 DIAGNOSIS — H01005 Unspecified blepharitis left lower eyelid: Secondary | ICD-10-CM | POA: Diagnosis not present

## 2015-01-03 DIAGNOSIS — H01001 Unspecified blepharitis right upper eyelid: Secondary | ICD-10-CM | POA: Diagnosis not present

## 2015-01-03 DIAGNOSIS — H04123 Dry eye syndrome of bilateral lacrimal glands: Secondary | ICD-10-CM | POA: Diagnosis not present

## 2015-01-17 DIAGNOSIS — H04123 Dry eye syndrome of bilateral lacrimal glands: Secondary | ICD-10-CM | POA: Diagnosis not present

## 2015-02-26 DIAGNOSIS — M899 Disorder of bone, unspecified: Secondary | ICD-10-CM | POA: Diagnosis not present

## 2015-04-30 ENCOUNTER — Other Ambulatory Visit: Payer: Self-pay | Admitting: Family Medicine

## 2015-04-30 ENCOUNTER — Ambulatory Visit
Admission: RE | Admit: 2015-04-30 | Discharge: 2015-04-30 | Disposition: A | Discharge status: Home / Self Care | Payer: Commercial Managed Care - HMO | Source: Ambulatory Visit | Attending: Family Medicine | Admitting: Family Medicine

## 2015-04-30 DIAGNOSIS — M545 Low back pain, unspecified: Secondary | ICD-10-CM

## 2015-04-30 DIAGNOSIS — M25559 Pain in unspecified hip: Secondary | ICD-10-CM

## 2015-04-30 DIAGNOSIS — Z9181 History of falling: Secondary | ICD-10-CM | POA: Diagnosis not present

## 2015-04-30 DIAGNOSIS — Z6830 Body mass index (BMI) 30.0-30.9, adult: Secondary | ICD-10-CM | POA: Diagnosis not present

## 2015-04-30 DIAGNOSIS — S79911A Unspecified injury of right hip, initial encounter: Secondary | ICD-10-CM | POA: Diagnosis not present

## 2015-04-30 DIAGNOSIS — S3992XA Unspecified injury of lower back, initial encounter: Secondary | ICD-10-CM | POA: Diagnosis not present

## 2015-04-30 DIAGNOSIS — M25551 Pain in right hip: Secondary | ICD-10-CM | POA: Diagnosis not present

## 2015-04-30 DIAGNOSIS — Z1389 Encounter for screening for other disorder: Secondary | ICD-10-CM | POA: Diagnosis not present

## 2015-05-16 ENCOUNTER — Other Ambulatory Visit: Payer: Self-pay | Admitting: Family Medicine

## 2015-05-16 DIAGNOSIS — M545 Low back pain, unspecified: Secondary | ICD-10-CM

## 2015-05-29 ENCOUNTER — Ambulatory Visit
Admission: RE | Admit: 2015-05-29 | Discharge: 2015-05-29 | Disposition: A | Discharge status: Home / Self Care | Payer: Commercial Managed Care - HMO | Source: Ambulatory Visit | Attending: Family Medicine | Admitting: Family Medicine

## 2015-05-29 DIAGNOSIS — M47817 Spondylosis without myelopathy or radiculopathy, lumbosacral region: Secondary | ICD-10-CM | POA: Diagnosis not present

## 2015-05-29 DIAGNOSIS — M4316 Spondylolisthesis, lumbar region: Secondary | ICD-10-CM | POA: Diagnosis not present

## 2015-05-29 DIAGNOSIS — M5136 Other intervertebral disc degeneration, lumbar region: Secondary | ICD-10-CM | POA: Diagnosis not present

## 2015-05-29 DIAGNOSIS — M545 Low back pain, unspecified: Secondary | ICD-10-CM

## 2015-05-29 DIAGNOSIS — M5126 Other intervertebral disc displacement, lumbar region: Secondary | ICD-10-CM | POA: Diagnosis not present

## 2015-06-04 DIAGNOSIS — Z6830 Body mass index (BMI) 30.0-30.9, adult: Secondary | ICD-10-CM | POA: Diagnosis not present

## 2015-06-04 DIAGNOSIS — M4806 Spinal stenosis, lumbar region: Secondary | ICD-10-CM | POA: Diagnosis not present

## 2015-06-04 DIAGNOSIS — M5136 Other intervertebral disc degeneration, lumbar region: Secondary | ICD-10-CM | POA: Diagnosis not present

## 2015-07-01 DIAGNOSIS — R7309 Other abnormal glucose: Secondary | ICD-10-CM | POA: Diagnosis not present

## 2015-07-01 DIAGNOSIS — E782 Mixed hyperlipidemia: Secondary | ICD-10-CM | POA: Diagnosis not present

## 2015-07-01 DIAGNOSIS — N183 Chronic kidney disease, stage 3 (moderate): Secondary | ICD-10-CM | POA: Diagnosis not present

## 2015-07-08 DIAGNOSIS — R7309 Other abnormal glucose: Secondary | ICD-10-CM | POA: Diagnosis not present

## 2015-07-08 DIAGNOSIS — Z23 Encounter for immunization: Secondary | ICD-10-CM | POA: Diagnosis not present

## 2015-07-08 DIAGNOSIS — Z1389 Encounter for screening for other disorder: Secondary | ICD-10-CM | POA: Diagnosis not present

## 2015-07-08 DIAGNOSIS — I10 Essential (primary) hypertension: Secondary | ICD-10-CM | POA: Diagnosis not present

## 2015-07-08 DIAGNOSIS — Z0000 Encounter for general adult medical examination without abnormal findings: Secondary | ICD-10-CM | POA: Diagnosis not present

## 2015-07-08 DIAGNOSIS — E782 Mixed hyperlipidemia: Secondary | ICD-10-CM | POA: Diagnosis not present

## 2015-07-08 DIAGNOSIS — N182 Chronic kidney disease, stage 2 (mild): Secondary | ICD-10-CM | POA: Diagnosis not present

## 2015-07-08 DIAGNOSIS — Z139 Encounter for screening, unspecified: Secondary | ICD-10-CM | POA: Diagnosis not present

## 2015-09-30 DIAGNOSIS — H00022 Hordeolum internum right lower eyelid: Secondary | ICD-10-CM | POA: Diagnosis not present

## 2015-10-01 DIAGNOSIS — H00022 Hordeolum internum right lower eyelid: Secondary | ICD-10-CM | POA: Diagnosis not present

## 2015-10-04 DIAGNOSIS — Z803 Family history of malignant neoplasm of breast: Secondary | ICD-10-CM | POA: Diagnosis not present

## 2015-10-04 DIAGNOSIS — Z1231 Encounter for screening mammogram for malignant neoplasm of breast: Secondary | ICD-10-CM | POA: Diagnosis not present

## 2015-12-25 DIAGNOSIS — M4806 Spinal stenosis, lumbar region: Secondary | ICD-10-CM | POA: Diagnosis not present

## 2015-12-25 DIAGNOSIS — Z23 Encounter for immunization: Secondary | ICD-10-CM | POA: Diagnosis not present

## 2015-12-25 DIAGNOSIS — Z6831 Body mass index (BMI) 31.0-31.9, adult: Secondary | ICD-10-CM | POA: Diagnosis not present

## 2015-12-25 DIAGNOSIS — M5136 Other intervertebral disc degeneration, lumbar region: Secondary | ICD-10-CM | POA: Diagnosis not present

## 2016-01-02 DIAGNOSIS — M549 Dorsalgia, unspecified: Secondary | ICD-10-CM | POA: Diagnosis not present

## 2016-01-02 DIAGNOSIS — M4316 Spondylolisthesis, lumbar region: Secondary | ICD-10-CM | POA: Diagnosis not present

## 2016-01-07 DIAGNOSIS — M4316 Spondylolisthesis, lumbar region: Secondary | ICD-10-CM | POA: Diagnosis not present

## 2016-01-07 DIAGNOSIS — M545 Low back pain: Secondary | ICD-10-CM | POA: Diagnosis not present

## 2016-01-07 DIAGNOSIS — M6281 Muscle weakness (generalized): Secondary | ICD-10-CM | POA: Diagnosis not present

## 2016-01-07 DIAGNOSIS — R293 Abnormal posture: Secondary | ICD-10-CM | POA: Diagnosis not present

## 2016-01-09 DIAGNOSIS — R293 Abnormal posture: Secondary | ICD-10-CM | POA: Diagnosis not present

## 2016-01-09 DIAGNOSIS — M6281 Muscle weakness (generalized): Secondary | ICD-10-CM | POA: Diagnosis not present

## 2016-01-09 DIAGNOSIS — M545 Low back pain: Secondary | ICD-10-CM | POA: Diagnosis not present

## 2016-01-09 DIAGNOSIS — M4316 Spondylolisthesis, lumbar region: Secondary | ICD-10-CM | POA: Diagnosis not present

## 2016-01-10 DIAGNOSIS — R293 Abnormal posture: Secondary | ICD-10-CM | POA: Diagnosis not present

## 2016-01-10 DIAGNOSIS — M545 Low back pain: Secondary | ICD-10-CM | POA: Diagnosis not present

## 2016-01-10 DIAGNOSIS — M6281 Muscle weakness (generalized): Secondary | ICD-10-CM | POA: Diagnosis not present

## 2016-01-10 DIAGNOSIS — M4316 Spondylolisthesis, lumbar region: Secondary | ICD-10-CM | POA: Diagnosis not present

## 2016-01-13 DIAGNOSIS — M6281 Muscle weakness (generalized): Secondary | ICD-10-CM | POA: Diagnosis not present

## 2016-01-13 DIAGNOSIS — M545 Low back pain: Secondary | ICD-10-CM | POA: Diagnosis not present

## 2016-01-13 DIAGNOSIS — M4316 Spondylolisthesis, lumbar region: Secondary | ICD-10-CM | POA: Diagnosis not present

## 2016-01-13 DIAGNOSIS — R293 Abnormal posture: Secondary | ICD-10-CM | POA: Diagnosis not present

## 2016-01-15 DIAGNOSIS — M4316 Spondylolisthesis, lumbar region: Secondary | ICD-10-CM | POA: Diagnosis not present

## 2016-01-15 DIAGNOSIS — M6281 Muscle weakness (generalized): Secondary | ICD-10-CM | POA: Diagnosis not present

## 2016-01-15 DIAGNOSIS — M545 Low back pain: Secondary | ICD-10-CM | POA: Diagnosis not present

## 2016-01-15 DIAGNOSIS — R293 Abnormal posture: Secondary | ICD-10-CM | POA: Diagnosis not present

## 2016-01-17 DIAGNOSIS — M4316 Spondylolisthesis, lumbar region: Secondary | ICD-10-CM | POA: Diagnosis not present

## 2016-01-17 DIAGNOSIS — R293 Abnormal posture: Secondary | ICD-10-CM | POA: Diagnosis not present

## 2016-01-17 DIAGNOSIS — M6281 Muscle weakness (generalized): Secondary | ICD-10-CM | POA: Diagnosis not present

## 2016-01-17 DIAGNOSIS — M545 Low back pain: Secondary | ICD-10-CM | POA: Diagnosis not present

## 2016-01-20 DIAGNOSIS — R293 Abnormal posture: Secondary | ICD-10-CM | POA: Diagnosis not present

## 2016-01-20 DIAGNOSIS — M4316 Spondylolisthesis, lumbar region: Secondary | ICD-10-CM | POA: Diagnosis not present

## 2016-01-20 DIAGNOSIS — M545 Low back pain: Secondary | ICD-10-CM | POA: Diagnosis not present

## 2016-01-20 DIAGNOSIS — M6281 Muscle weakness (generalized): Secondary | ICD-10-CM | POA: Diagnosis not present

## 2016-01-22 DIAGNOSIS — R293 Abnormal posture: Secondary | ICD-10-CM | POA: Diagnosis not present

## 2016-01-22 DIAGNOSIS — M6281 Muscle weakness (generalized): Secondary | ICD-10-CM | POA: Diagnosis not present

## 2016-01-22 DIAGNOSIS — M545 Low back pain: Secondary | ICD-10-CM | POA: Diagnosis not present

## 2016-01-22 DIAGNOSIS — M4316 Spondylolisthesis, lumbar region: Secondary | ICD-10-CM | POA: Diagnosis not present

## 2016-01-23 DIAGNOSIS — H353132 Nonexudative age-related macular degeneration, bilateral, intermediate dry stage: Secondary | ICD-10-CM | POA: Diagnosis not present

## 2016-01-23 DIAGNOSIS — D3131 Benign neoplasm of right choroid: Secondary | ICD-10-CM | POA: Diagnosis not present

## 2016-01-23 DIAGNOSIS — D3132 Benign neoplasm of left choroid: Secondary | ICD-10-CM | POA: Diagnosis not present

## 2016-01-23 DIAGNOSIS — Z961 Presence of intraocular lens: Secondary | ICD-10-CM | POA: Diagnosis not present

## 2016-01-24 DIAGNOSIS — M4316 Spondylolisthesis, lumbar region: Secondary | ICD-10-CM | POA: Diagnosis not present

## 2016-01-24 DIAGNOSIS — M545 Low back pain: Secondary | ICD-10-CM | POA: Diagnosis not present

## 2016-01-24 DIAGNOSIS — R293 Abnormal posture: Secondary | ICD-10-CM | POA: Diagnosis not present

## 2016-01-24 DIAGNOSIS — M6281 Muscle weakness (generalized): Secondary | ICD-10-CM | POA: Diagnosis not present

## 2016-01-27 DIAGNOSIS — M545 Low back pain: Secondary | ICD-10-CM | POA: Diagnosis not present

## 2016-01-27 DIAGNOSIS — M6281 Muscle weakness (generalized): Secondary | ICD-10-CM | POA: Diagnosis not present

## 2016-01-27 DIAGNOSIS — R293 Abnormal posture: Secondary | ICD-10-CM | POA: Diagnosis not present

## 2016-01-27 DIAGNOSIS — M4316 Spondylolisthesis, lumbar region: Secondary | ICD-10-CM | POA: Diagnosis not present

## 2016-01-29 DIAGNOSIS — M6281 Muscle weakness (generalized): Secondary | ICD-10-CM | POA: Diagnosis not present

## 2016-01-29 DIAGNOSIS — M4316 Spondylolisthesis, lumbar region: Secondary | ICD-10-CM | POA: Diagnosis not present

## 2016-01-29 DIAGNOSIS — R293 Abnormal posture: Secondary | ICD-10-CM | POA: Diagnosis not present

## 2016-01-29 DIAGNOSIS — M545 Low back pain: Secondary | ICD-10-CM | POA: Diagnosis not present

## 2016-01-30 DIAGNOSIS — I10 Essential (primary) hypertension: Secondary | ICD-10-CM | POA: Diagnosis not present

## 2016-01-30 DIAGNOSIS — Z6830 Body mass index (BMI) 30.0-30.9, adult: Secondary | ICD-10-CM | POA: Diagnosis not present

## 2016-01-30 DIAGNOSIS — E782 Mixed hyperlipidemia: Secondary | ICD-10-CM | POA: Diagnosis not present

## 2016-01-30 DIAGNOSIS — R7303 Prediabetes: Secondary | ICD-10-CM | POA: Diagnosis not present

## 2016-01-30 DIAGNOSIS — N182 Chronic kidney disease, stage 2 (mild): Secondary | ICD-10-CM | POA: Diagnosis not present

## 2016-01-30 DIAGNOSIS — E669 Obesity, unspecified: Secondary | ICD-10-CM | POA: Diagnosis not present

## 2016-01-31 DIAGNOSIS — M4316 Spondylolisthesis, lumbar region: Secondary | ICD-10-CM | POA: Diagnosis not present

## 2016-01-31 DIAGNOSIS — R293 Abnormal posture: Secondary | ICD-10-CM | POA: Diagnosis not present

## 2016-01-31 DIAGNOSIS — M6281 Muscle weakness (generalized): Secondary | ICD-10-CM | POA: Diagnosis not present

## 2016-01-31 DIAGNOSIS — M545 Low back pain: Secondary | ICD-10-CM | POA: Diagnosis not present

## 2016-02-03 DIAGNOSIS — M4316 Spondylolisthesis, lumbar region: Secondary | ICD-10-CM | POA: Diagnosis not present

## 2016-02-03 DIAGNOSIS — R293 Abnormal posture: Secondary | ICD-10-CM | POA: Diagnosis not present

## 2016-02-03 DIAGNOSIS — M545 Low back pain: Secondary | ICD-10-CM | POA: Diagnosis not present

## 2016-02-03 DIAGNOSIS — M6281 Muscle weakness (generalized): Secondary | ICD-10-CM | POA: Diagnosis not present

## 2016-02-05 DIAGNOSIS — M6281 Muscle weakness (generalized): Secondary | ICD-10-CM | POA: Diagnosis not present

## 2016-02-05 DIAGNOSIS — M4316 Spondylolisthesis, lumbar region: Secondary | ICD-10-CM | POA: Diagnosis not present

## 2016-02-05 DIAGNOSIS — M545 Low back pain: Secondary | ICD-10-CM | POA: Diagnosis not present

## 2016-02-05 DIAGNOSIS — R293 Abnormal posture: Secondary | ICD-10-CM | POA: Diagnosis not present

## 2016-02-07 DIAGNOSIS — M545 Low back pain: Secondary | ICD-10-CM | POA: Diagnosis not present

## 2016-02-07 DIAGNOSIS — M4316 Spondylolisthesis, lumbar region: Secondary | ICD-10-CM | POA: Diagnosis not present

## 2016-02-07 DIAGNOSIS — R293 Abnormal posture: Secondary | ICD-10-CM | POA: Diagnosis not present

## 2016-02-07 DIAGNOSIS — M6281 Muscle weakness (generalized): Secondary | ICD-10-CM | POA: Diagnosis not present

## 2016-02-10 DIAGNOSIS — M545 Low back pain: Secondary | ICD-10-CM | POA: Diagnosis not present

## 2016-02-10 DIAGNOSIS — M6281 Muscle weakness (generalized): Secondary | ICD-10-CM | POA: Diagnosis not present

## 2016-02-10 DIAGNOSIS — M4316 Spondylolisthesis, lumbar region: Secondary | ICD-10-CM | POA: Diagnosis not present

## 2016-02-10 DIAGNOSIS — R293 Abnormal posture: Secondary | ICD-10-CM | POA: Diagnosis not present

## 2016-02-12 DIAGNOSIS — M4316 Spondylolisthesis, lumbar region: Secondary | ICD-10-CM | POA: Diagnosis not present

## 2016-02-12 DIAGNOSIS — M545 Low back pain: Secondary | ICD-10-CM | POA: Diagnosis not present

## 2016-02-12 DIAGNOSIS — R293 Abnormal posture: Secondary | ICD-10-CM | POA: Diagnosis not present

## 2016-02-12 DIAGNOSIS — M6281 Muscle weakness (generalized): Secondary | ICD-10-CM | POA: Diagnosis not present

## 2016-02-14 DIAGNOSIS — R293 Abnormal posture: Secondary | ICD-10-CM | POA: Diagnosis not present

## 2016-02-14 DIAGNOSIS — M4316 Spondylolisthesis, lumbar region: Secondary | ICD-10-CM | POA: Diagnosis not present

## 2016-02-14 DIAGNOSIS — M6281 Muscle weakness (generalized): Secondary | ICD-10-CM | POA: Diagnosis not present

## 2016-02-14 DIAGNOSIS — M545 Low back pain: Secondary | ICD-10-CM | POA: Diagnosis not present

## 2016-02-17 DIAGNOSIS — M6281 Muscle weakness (generalized): Secondary | ICD-10-CM | POA: Diagnosis not present

## 2016-02-17 DIAGNOSIS — R293 Abnormal posture: Secondary | ICD-10-CM | POA: Diagnosis not present

## 2016-02-17 DIAGNOSIS — M545 Low back pain: Secondary | ICD-10-CM | POA: Diagnosis not present

## 2016-02-17 DIAGNOSIS — M4316 Spondylolisthesis, lumbar region: Secondary | ICD-10-CM | POA: Diagnosis not present

## 2016-02-19 DIAGNOSIS — M4316 Spondylolisthesis, lumbar region: Secondary | ICD-10-CM | POA: Diagnosis not present

## 2016-02-19 DIAGNOSIS — R293 Abnormal posture: Secondary | ICD-10-CM | POA: Diagnosis not present

## 2016-02-19 DIAGNOSIS — M545 Low back pain: Secondary | ICD-10-CM | POA: Diagnosis not present

## 2016-02-19 DIAGNOSIS — M6281 Muscle weakness (generalized): Secondary | ICD-10-CM | POA: Diagnosis not present

## 2016-02-21 DIAGNOSIS — M545 Low back pain: Secondary | ICD-10-CM | POA: Diagnosis not present

## 2016-02-21 DIAGNOSIS — M4316 Spondylolisthesis, lumbar region: Secondary | ICD-10-CM | POA: Diagnosis not present

## 2016-02-21 DIAGNOSIS — M6281 Muscle weakness (generalized): Secondary | ICD-10-CM | POA: Diagnosis not present

## 2016-02-21 DIAGNOSIS — R293 Abnormal posture: Secondary | ICD-10-CM | POA: Diagnosis not present

## 2016-02-24 DIAGNOSIS — R293 Abnormal posture: Secondary | ICD-10-CM | POA: Diagnosis not present

## 2016-02-24 DIAGNOSIS — M6281 Muscle weakness (generalized): Secondary | ICD-10-CM | POA: Diagnosis not present

## 2016-02-24 DIAGNOSIS — M545 Low back pain: Secondary | ICD-10-CM | POA: Diagnosis not present

## 2016-02-24 DIAGNOSIS — M4316 Spondylolisthesis, lumbar region: Secondary | ICD-10-CM | POA: Diagnosis not present

## 2016-02-26 DIAGNOSIS — M6281 Muscle weakness (generalized): Secondary | ICD-10-CM | POA: Diagnosis not present

## 2016-02-26 DIAGNOSIS — R293 Abnormal posture: Secondary | ICD-10-CM | POA: Diagnosis not present

## 2016-02-26 DIAGNOSIS — M4316 Spondylolisthesis, lumbar region: Secondary | ICD-10-CM | POA: Diagnosis not present

## 2016-02-26 DIAGNOSIS — M545 Low back pain: Secondary | ICD-10-CM | POA: Diagnosis not present

## 2016-02-28 DIAGNOSIS — M545 Low back pain: Secondary | ICD-10-CM | POA: Diagnosis not present

## 2016-02-28 DIAGNOSIS — M6281 Muscle weakness (generalized): Secondary | ICD-10-CM | POA: Diagnosis not present

## 2016-02-28 DIAGNOSIS — R293 Abnormal posture: Secondary | ICD-10-CM | POA: Diagnosis not present

## 2016-02-28 DIAGNOSIS — M4316 Spondylolisthesis, lumbar region: Secondary | ICD-10-CM | POA: Diagnosis not present

## 2016-03-02 DIAGNOSIS — R293 Abnormal posture: Secondary | ICD-10-CM | POA: Diagnosis not present

## 2016-03-02 DIAGNOSIS — M545 Low back pain: Secondary | ICD-10-CM | POA: Diagnosis not present

## 2016-03-02 DIAGNOSIS — M6281 Muscle weakness (generalized): Secondary | ICD-10-CM | POA: Diagnosis not present

## 2016-03-02 DIAGNOSIS — M4316 Spondylolisthesis, lumbar region: Secondary | ICD-10-CM | POA: Diagnosis not present

## 2016-03-04 ENCOUNTER — Other Ambulatory Visit: Payer: Self-pay | Admitting: Neurosurgery

## 2016-03-04 DIAGNOSIS — M4316 Spondylolisthesis, lumbar region: Secondary | ICD-10-CM | POA: Diagnosis not present

## 2016-03-04 DIAGNOSIS — R293 Abnormal posture: Secondary | ICD-10-CM | POA: Diagnosis not present

## 2016-03-04 DIAGNOSIS — M6281 Muscle weakness (generalized): Secondary | ICD-10-CM | POA: Diagnosis not present

## 2016-03-04 DIAGNOSIS — M545 Low back pain: Secondary | ICD-10-CM | POA: Diagnosis not present

## 2016-03-05 DIAGNOSIS — M4316 Spondylolisthesis, lumbar region: Secondary | ICD-10-CM | POA: Diagnosis not present

## 2016-03-05 DIAGNOSIS — I10 Essential (primary) hypertension: Secondary | ICD-10-CM | POA: Diagnosis not present

## 2016-03-05 DIAGNOSIS — Z6831 Body mass index (BMI) 31.0-31.9, adult: Secondary | ICD-10-CM | POA: Diagnosis not present

## 2016-03-06 DIAGNOSIS — M4316 Spondylolisthesis, lumbar region: Secondary | ICD-10-CM | POA: Diagnosis not present

## 2016-03-06 DIAGNOSIS — R293 Abnormal posture: Secondary | ICD-10-CM | POA: Diagnosis not present

## 2016-03-06 DIAGNOSIS — M6281 Muscle weakness (generalized): Secondary | ICD-10-CM | POA: Diagnosis not present

## 2016-03-06 DIAGNOSIS — M545 Low back pain: Secondary | ICD-10-CM | POA: Diagnosis not present

## 2016-03-09 DIAGNOSIS — M6281 Muscle weakness (generalized): Secondary | ICD-10-CM | POA: Diagnosis not present

## 2016-03-09 DIAGNOSIS — R293 Abnormal posture: Secondary | ICD-10-CM | POA: Diagnosis not present

## 2016-03-09 DIAGNOSIS — M545 Low back pain: Secondary | ICD-10-CM | POA: Diagnosis not present

## 2016-03-09 DIAGNOSIS — M4316 Spondylolisthesis, lumbar region: Secondary | ICD-10-CM | POA: Diagnosis not present

## 2016-03-11 ENCOUNTER — Other Ambulatory Visit: Payer: Commercial Managed Care - HMO

## 2016-03-11 DIAGNOSIS — M545 Low back pain: Secondary | ICD-10-CM | POA: Diagnosis not present

## 2016-03-11 DIAGNOSIS — R293 Abnormal posture: Secondary | ICD-10-CM | POA: Diagnosis not present

## 2016-03-11 DIAGNOSIS — M4316 Spondylolisthesis, lumbar region: Secondary | ICD-10-CM | POA: Diagnosis not present

## 2016-03-11 DIAGNOSIS — M6281 Muscle weakness (generalized): Secondary | ICD-10-CM | POA: Diagnosis not present

## 2016-03-13 DIAGNOSIS — M545 Low back pain: Secondary | ICD-10-CM | POA: Diagnosis not present

## 2016-03-13 DIAGNOSIS — R293 Abnormal posture: Secondary | ICD-10-CM | POA: Diagnosis not present

## 2016-03-13 DIAGNOSIS — M6281 Muscle weakness (generalized): Secondary | ICD-10-CM | POA: Diagnosis not present

## 2016-03-13 DIAGNOSIS — M4316 Spondylolisthesis, lumbar region: Secondary | ICD-10-CM | POA: Diagnosis not present

## 2016-03-16 DIAGNOSIS — M4316 Spondylolisthesis, lumbar region: Secondary | ICD-10-CM | POA: Diagnosis not present

## 2016-03-16 DIAGNOSIS — R293 Abnormal posture: Secondary | ICD-10-CM | POA: Diagnosis not present

## 2016-03-16 DIAGNOSIS — M6281 Muscle weakness (generalized): Secondary | ICD-10-CM | POA: Diagnosis not present

## 2016-03-16 DIAGNOSIS — M545 Low back pain: Secondary | ICD-10-CM | POA: Diagnosis not present

## 2016-03-18 DIAGNOSIS — R293 Abnormal posture: Secondary | ICD-10-CM | POA: Diagnosis not present

## 2016-03-18 DIAGNOSIS — M4316 Spondylolisthesis, lumbar region: Secondary | ICD-10-CM | POA: Diagnosis not present

## 2016-03-18 DIAGNOSIS — M545 Low back pain: Secondary | ICD-10-CM | POA: Diagnosis not present

## 2016-03-18 DIAGNOSIS — M6281 Muscle weakness (generalized): Secondary | ICD-10-CM | POA: Diagnosis not present

## 2016-03-20 DIAGNOSIS — M4316 Spondylolisthesis, lumbar region: Secondary | ICD-10-CM | POA: Diagnosis not present

## 2016-03-20 DIAGNOSIS — M545 Low back pain: Secondary | ICD-10-CM | POA: Diagnosis not present

## 2016-03-20 DIAGNOSIS — M6281 Muscle weakness (generalized): Secondary | ICD-10-CM | POA: Diagnosis not present

## 2016-03-20 DIAGNOSIS — R293 Abnormal posture: Secondary | ICD-10-CM | POA: Diagnosis not present

## 2016-03-23 DIAGNOSIS — R293 Abnormal posture: Secondary | ICD-10-CM | POA: Diagnosis not present

## 2016-03-23 DIAGNOSIS — M6281 Muscle weakness (generalized): Secondary | ICD-10-CM | POA: Diagnosis not present

## 2016-03-23 DIAGNOSIS — M545 Low back pain: Secondary | ICD-10-CM | POA: Diagnosis not present

## 2016-03-23 DIAGNOSIS — M4316 Spondylolisthesis, lumbar region: Secondary | ICD-10-CM | POA: Diagnosis not present

## 2016-03-25 DIAGNOSIS — M4316 Spondylolisthesis, lumbar region: Secondary | ICD-10-CM | POA: Diagnosis not present

## 2016-03-25 DIAGNOSIS — R293 Abnormal posture: Secondary | ICD-10-CM | POA: Diagnosis not present

## 2016-03-25 DIAGNOSIS — M6281 Muscle weakness (generalized): Secondary | ICD-10-CM | POA: Diagnosis not present

## 2016-03-25 DIAGNOSIS — M545 Low back pain: Secondary | ICD-10-CM | POA: Diagnosis not present

## 2016-03-27 DIAGNOSIS — M545 Low back pain: Secondary | ICD-10-CM | POA: Diagnosis not present

## 2016-03-27 DIAGNOSIS — M4316 Spondylolisthesis, lumbar region: Secondary | ICD-10-CM | POA: Diagnosis not present

## 2016-03-27 DIAGNOSIS — R293 Abnormal posture: Secondary | ICD-10-CM | POA: Diagnosis not present

## 2016-03-27 DIAGNOSIS — M6281 Muscle weakness (generalized): Secondary | ICD-10-CM | POA: Diagnosis not present

## 2016-03-30 DIAGNOSIS — M545 Low back pain: Secondary | ICD-10-CM | POA: Diagnosis not present

## 2016-03-30 DIAGNOSIS — R293 Abnormal posture: Secondary | ICD-10-CM | POA: Diagnosis not present

## 2016-03-30 DIAGNOSIS — M4316 Spondylolisthesis, lumbar region: Secondary | ICD-10-CM | POA: Diagnosis not present

## 2016-03-30 DIAGNOSIS — M6281 Muscle weakness (generalized): Secondary | ICD-10-CM | POA: Diagnosis not present

## 2016-04-03 DIAGNOSIS — M4316 Spondylolisthesis, lumbar region: Secondary | ICD-10-CM | POA: Diagnosis not present

## 2016-04-03 DIAGNOSIS — M545 Low back pain: Secondary | ICD-10-CM | POA: Diagnosis not present

## 2016-04-03 DIAGNOSIS — M6281 Muscle weakness (generalized): Secondary | ICD-10-CM | POA: Diagnosis not present

## 2016-04-03 DIAGNOSIS — R293 Abnormal posture: Secondary | ICD-10-CM | POA: Diagnosis not present

## 2016-04-06 DIAGNOSIS — R293 Abnormal posture: Secondary | ICD-10-CM | POA: Diagnosis not present

## 2016-04-06 DIAGNOSIS — M4316 Spondylolisthesis, lumbar region: Secondary | ICD-10-CM | POA: Diagnosis not present

## 2016-04-06 DIAGNOSIS — M6281 Muscle weakness (generalized): Secondary | ICD-10-CM | POA: Diagnosis not present

## 2016-04-06 DIAGNOSIS — M545 Low back pain: Secondary | ICD-10-CM | POA: Diagnosis not present

## 2016-04-08 DIAGNOSIS — M545 Low back pain: Secondary | ICD-10-CM | POA: Diagnosis not present

## 2016-04-08 DIAGNOSIS — M6281 Muscle weakness (generalized): Secondary | ICD-10-CM | POA: Diagnosis not present

## 2016-04-08 DIAGNOSIS — M4316 Spondylolisthesis, lumbar region: Secondary | ICD-10-CM | POA: Diagnosis not present

## 2016-04-08 DIAGNOSIS — R293 Abnormal posture: Secondary | ICD-10-CM | POA: Diagnosis not present

## 2016-04-15 DIAGNOSIS — R293 Abnormal posture: Secondary | ICD-10-CM | POA: Diagnosis not present

## 2016-04-15 DIAGNOSIS — M545 Low back pain: Secondary | ICD-10-CM | POA: Diagnosis not present

## 2016-04-15 DIAGNOSIS — M4316 Spondylolisthesis, lumbar region: Secondary | ICD-10-CM | POA: Diagnosis not present

## 2016-04-15 DIAGNOSIS — M6281 Muscle weakness (generalized): Secondary | ICD-10-CM | POA: Diagnosis not present

## 2016-04-17 DIAGNOSIS — R293 Abnormal posture: Secondary | ICD-10-CM | POA: Diagnosis not present

## 2016-04-17 DIAGNOSIS — M4316 Spondylolisthesis, lumbar region: Secondary | ICD-10-CM | POA: Diagnosis not present

## 2016-04-17 DIAGNOSIS — M6281 Muscle weakness (generalized): Secondary | ICD-10-CM | POA: Diagnosis not present

## 2016-04-17 DIAGNOSIS — M545 Low back pain: Secondary | ICD-10-CM | POA: Diagnosis not present

## 2016-04-20 DIAGNOSIS — R293 Abnormal posture: Secondary | ICD-10-CM | POA: Diagnosis not present

## 2016-04-20 DIAGNOSIS — M545 Low back pain: Secondary | ICD-10-CM | POA: Diagnosis not present

## 2016-04-20 DIAGNOSIS — M6281 Muscle weakness (generalized): Secondary | ICD-10-CM | POA: Diagnosis not present

## 2016-04-20 DIAGNOSIS — M4316 Spondylolisthesis, lumbar region: Secondary | ICD-10-CM | POA: Diagnosis not present

## 2016-04-22 DIAGNOSIS — M6281 Muscle weakness (generalized): Secondary | ICD-10-CM | POA: Diagnosis not present

## 2016-04-22 DIAGNOSIS — M545 Low back pain: Secondary | ICD-10-CM | POA: Diagnosis not present

## 2016-04-22 DIAGNOSIS — R293 Abnormal posture: Secondary | ICD-10-CM | POA: Diagnosis not present

## 2016-04-22 DIAGNOSIS — M4316 Spondylolisthesis, lumbar region: Secondary | ICD-10-CM | POA: Diagnosis not present

## 2016-04-28 DIAGNOSIS — M6281 Muscle weakness (generalized): Secondary | ICD-10-CM | POA: Diagnosis not present

## 2016-04-28 DIAGNOSIS — M4316 Spondylolisthesis, lumbar region: Secondary | ICD-10-CM | POA: Diagnosis not present

## 2016-04-28 DIAGNOSIS — M545 Low back pain: Secondary | ICD-10-CM | POA: Diagnosis not present

## 2016-04-28 DIAGNOSIS — R293 Abnormal posture: Secondary | ICD-10-CM | POA: Diagnosis not present

## 2016-05-01 DIAGNOSIS — M6281 Muscle weakness (generalized): Secondary | ICD-10-CM | POA: Diagnosis not present

## 2016-05-01 DIAGNOSIS — M545 Low back pain: Secondary | ICD-10-CM | POA: Diagnosis not present

## 2016-05-01 DIAGNOSIS — M4316 Spondylolisthesis, lumbar region: Secondary | ICD-10-CM | POA: Diagnosis not present

## 2016-05-01 DIAGNOSIS — R293 Abnormal posture: Secondary | ICD-10-CM | POA: Diagnosis not present

## 2016-08-05 DIAGNOSIS — Z1389 Encounter for screening for other disorder: Secondary | ICD-10-CM | POA: Diagnosis not present

## 2016-08-05 DIAGNOSIS — E782 Mixed hyperlipidemia: Secondary | ICD-10-CM | POA: Diagnosis not present

## 2016-08-05 DIAGNOSIS — N183 Chronic kidney disease, stage 3 (moderate): Secondary | ICD-10-CM | POA: Diagnosis not present

## 2016-08-05 DIAGNOSIS — Z6831 Body mass index (BMI) 31.0-31.9, adult: Secondary | ICD-10-CM | POA: Diagnosis not present

## 2016-08-05 DIAGNOSIS — Z9181 History of falling: Secondary | ICD-10-CM | POA: Diagnosis not present

## 2016-08-05 DIAGNOSIS — R7303 Prediabetes: Secondary | ICD-10-CM | POA: Diagnosis not present

## 2016-08-05 DIAGNOSIS — I10 Essential (primary) hypertension: Secondary | ICD-10-CM | POA: Diagnosis not present

## 2016-09-29 DIAGNOSIS — Z23 Encounter for immunization: Secondary | ICD-10-CM | POA: Diagnosis not present

## 2016-10-05 DIAGNOSIS — Z1231 Encounter for screening mammogram for malignant neoplasm of breast: Secondary | ICD-10-CM | POA: Diagnosis not present

## 2016-10-05 DIAGNOSIS — Z803 Family history of malignant neoplasm of breast: Secondary | ICD-10-CM | POA: Diagnosis not present

## 2017-01-20 DIAGNOSIS — I10 Essential (primary) hypertension: Secondary | ICD-10-CM | POA: Diagnosis not present

## 2017-01-20 DIAGNOSIS — Z6832 Body mass index (BMI) 32.0-32.9, adult: Secondary | ICD-10-CM | POA: Diagnosis not present

## 2017-01-20 DIAGNOSIS — R0602 Shortness of breath: Secondary | ICD-10-CM | POA: Diagnosis not present

## 2017-01-20 DIAGNOSIS — R42 Dizziness and giddiness: Secondary | ICD-10-CM | POA: Diagnosis not present

## 2017-01-22 ENCOUNTER — Telehealth: Payer: Self-pay | Admitting: *Deleted

## 2017-01-22 NOTE — Telephone Encounter (Signed)
NOTES SENT TO SCHEDULING.  °

## 2017-02-01 DIAGNOSIS — D3131 Benign neoplasm of right choroid: Secondary | ICD-10-CM | POA: Diagnosis not present

## 2017-02-01 DIAGNOSIS — R51 Headache: Secondary | ICD-10-CM | POA: Diagnosis not present

## 2017-02-01 DIAGNOSIS — D3132 Benign neoplasm of left choroid: Secondary | ICD-10-CM | POA: Diagnosis not present

## 2017-02-01 DIAGNOSIS — H52223 Regular astigmatism, bilateral: Secondary | ICD-10-CM | POA: Diagnosis not present

## 2017-02-01 DIAGNOSIS — H524 Presbyopia: Secondary | ICD-10-CM | POA: Diagnosis not present

## 2017-02-01 DIAGNOSIS — H353132 Nonexudative age-related macular degeneration, bilateral, intermediate dry stage: Secondary | ICD-10-CM | POA: Diagnosis not present

## 2017-02-01 DIAGNOSIS — H5213 Myopia, bilateral: Secondary | ICD-10-CM | POA: Diagnosis not present

## 2017-02-12 DIAGNOSIS — R519 Headache, unspecified: Secondary | ICD-10-CM | POA: Insufficient documentation

## 2017-02-12 DIAGNOSIS — R0789 Other chest pain: Secondary | ICD-10-CM | POA: Insufficient documentation

## 2017-02-12 DIAGNOSIS — R0600 Dyspnea, unspecified: Secondary | ICD-10-CM | POA: Insufficient documentation

## 2017-02-12 DIAGNOSIS — R51 Headache: Secondary | ICD-10-CM

## 2017-02-12 DIAGNOSIS — R0602 Shortness of breath: Secondary | ICD-10-CM | POA: Insufficient documentation

## 2017-02-12 DIAGNOSIS — R42 Dizziness and giddiness: Secondary | ICD-10-CM | POA: Insufficient documentation

## 2017-02-12 DIAGNOSIS — M25519 Pain in unspecified shoulder: Secondary | ICD-10-CM | POA: Insufficient documentation

## 2017-02-17 DIAGNOSIS — Z139 Encounter for screening, unspecified: Secondary | ICD-10-CM | POA: Diagnosis not present

## 2017-02-17 DIAGNOSIS — I10 Essential (primary) hypertension: Secondary | ICD-10-CM | POA: Diagnosis not present

## 2017-02-17 DIAGNOSIS — R0981 Nasal congestion: Secondary | ICD-10-CM | POA: Diagnosis not present

## 2017-02-17 DIAGNOSIS — Z79899 Other long term (current) drug therapy: Secondary | ICD-10-CM | POA: Diagnosis not present

## 2017-02-17 DIAGNOSIS — K219 Gastro-esophageal reflux disease without esophagitis: Secondary | ICD-10-CM | POA: Diagnosis not present

## 2017-02-17 DIAGNOSIS — Z6832 Body mass index (BMI) 32.0-32.9, adult: Secondary | ICD-10-CM | POA: Diagnosis not present

## 2017-02-17 DIAGNOSIS — N183 Chronic kidney disease, stage 3 (moderate): Secondary | ICD-10-CM | POA: Diagnosis not present

## 2017-02-17 DIAGNOSIS — R7303 Prediabetes: Secondary | ICD-10-CM | POA: Diagnosis not present

## 2017-02-17 DIAGNOSIS — E782 Mixed hyperlipidemia: Secondary | ICD-10-CM | POA: Diagnosis not present

## 2017-02-23 ENCOUNTER — Ambulatory Visit (INDEPENDENT_AMBULATORY_CARE_PROVIDER_SITE_OTHER): Payer: Medicare HMO

## 2017-02-23 ENCOUNTER — Ambulatory Visit (INDEPENDENT_AMBULATORY_CARE_PROVIDER_SITE_OTHER): Payer: Medicare HMO | Admitting: Cardiology

## 2017-02-23 ENCOUNTER — Encounter: Payer: Self-pay | Admitting: Podiatry

## 2017-02-23 ENCOUNTER — Encounter: Payer: Self-pay | Admitting: Cardiology

## 2017-02-23 ENCOUNTER — Ambulatory Visit (INDEPENDENT_AMBULATORY_CARE_PROVIDER_SITE_OTHER): Payer: Medicare HMO | Admitting: Podiatry

## 2017-02-23 VITALS — BP 136/80 | HR 72 | Ht 67.0 in | Wt 205.1 lb

## 2017-02-23 VITALS — BP 112/61 | HR 81 | Resp 16

## 2017-02-23 DIAGNOSIS — R0789 Other chest pain: Secondary | ICD-10-CM | POA: Insufficient documentation

## 2017-02-23 DIAGNOSIS — R42 Dizziness and giddiness: Secondary | ICD-10-CM

## 2017-02-23 DIAGNOSIS — R0600 Dyspnea, unspecified: Secondary | ICD-10-CM | POA: Diagnosis not present

## 2017-02-23 DIAGNOSIS — I10 Essential (primary) hypertension: Secondary | ICD-10-CM | POA: Diagnosis not present

## 2017-02-23 DIAGNOSIS — M898X9 Other specified disorders of bone, unspecified site: Secondary | ICD-10-CM

## 2017-02-23 NOTE — Progress Notes (Signed)
Cardiology Office Note    Date:  02/23/2017   ID:  KHARLIE BRING, DOB 22-Mar-1931, MRN [id number]  PCP:  [facility]  Cardiologist:   Candee Furbish, MD   Chief Complaint  Patient presents with  . New Patient (Initial Visit)    dizziness    History of Present Illness:  [name] is a 83 y.o. female here for the evaluation of shortness of breath. She is noticing this over the past few months but this seems to be getting worse with minimal exertion and may be associated with chest tightness like, left shoulder pain. She feels that the pain is accentuated especially she tries to carry anything. It has been hard for her. Her dizziness as well/disequilibrium has been a factor. When she walks in the backyard to her daughter's house on uneven ground this is challenging for her. She also notes that she has had some numbness in her feet bilaterally might, she is going to see a podiatrist. She sometimes has right thumb numbness as well when she is sitting.  In the distant past she had had an episode of atrial fibrillation in 03-10-09 after knee surgery but this has never returned. She had a nuclear stress test prior to the knee surgery which was normal per patient. Sometimes her blood pressure is elevated. She also has some unclear dizziness as well as.  An EKG shows sinus rhythm, leftward axis with nonspecific T-wave flattening. On 01/20/17, personally viewed. EKG today on 02/23/17 shows sinus rhythm with poor R-wave progression and downsloping ST segments in 3, F, V5, V6, possible inferolateral ischemia.  She is nondiabetic, nonsmoker.  I took care of her husband years ago who had passed away.  03-10-09    Past Medical History:  Diagnosis Date  . Chronic renal insufficiency, stage III (moderate)   . DDD (degenerative disc disease), lumbar   . Diverticulosis   . Dizziness   . GERD (gastroesophageal reflux disease)   . Herpes simplex infection   . History of anemia   . History of  atrial fibrillation   . HTN (hypertension)   . Mixed hyperlipidemia   . Osteoarthritis   . Osteopenia   . Prediabetes   . SOB (shortness of breath) on exertion   . Spinal stenosis of lumbar region at multiple levels     History reviewed. No pertinent surgical history.  Current Medications: Outpatient Medications Prior to Visit  Medication Sig Dispense Refill  . aspirin EC 81 MG tablet Take 81 mg by mouth daily.    . Cholecalciferol (VITAMIN D3) 1000 units CAPS Take 1,000 Units by mouth daily.     Marland Kitchen losartan-hydrochlorothiazide (HYZAAR) 100-25 MG tablet Take 1 tablet by mouth daily.    . Multiple Vitamins-Minerals (PRESERVISION AREDS PO) Take 1 tablet by mouth daily.     Marland Kitchen omeprazole (PRILOSEC) 20 MG capsule Take 20 mg by mouth daily.    . valACYclovir (VALTREX) 1000 MG tablet Take 1,000 mg by mouth 2 (two) times daily as needed (fever blisters). 2 MORE TABLETS IN 12 HR TOTAL OF 4 TABLETS PER EPISODE     . vitamin E 400 UNIT capsule Take 400 Units by mouth daily.     No facility-administered medications prior to visit.      Allergies:   Adhesive [tape]; Codeine; Fenofibrate; Morphine and related; and Sulfur   Social History   Social History  . Marital status: Widowed    Spouse name: N/A  . Number of children: N/A  .  Years of education: N/A   Social History Main Topics  . Smoking status: Never Smoker  . Smokeless tobacco: Never Used  . Alcohol use No  . Drug use: No  . Sexual activity: Not Asked   Other Topics Concern  . None   Social History Narrative  . None     Family History:  The patient's family history includes Diabetes in her mother; Heart attack in her father and mother; Other in her father.   ROS:   Please see the history of present illness.    ROS All other systems reviewed and are negative.   PHYSICAL EXAM:   VS:  BP 136/80   Pulse 72   Ht 5\' 7"  (1.702 m)   Wt 205 lb 1.9 oz (93 kg)   BMI 32.13 kg/m    GEN: Well nourished, well developed, in no  acute distress  HEENT: normal  Neck: no JVD, carotid bruits, or masses Cardiac: RRR; no murmurs, rubs, or gallops,no edema  Respiratory:  clear to auscultation bilaterally, normal work of breathing GI: soft, nontender, nondistended, + BS MS: no deformity or atrophy  Skin: warm and dry, no rash Neuro:  Alert and Oriented x 3, Strength and sensation are intact Psych: euthymic mood, full affect  Wt Readings from Last 3 Encounters:  02/23/17 205 lb 1.9 oz (93 kg)      Studies/Labs Reviewed:   EKG:  EKG is ordered today.  02/23/17-sinus rhythm, poor progression, T-wave inversion in 3, aVF, V6, V5, subtle in V4. Possible anterolateral ischemia.  Recent Labs: No results found for requested labs within last 8760 hours.   Lipid Panel No results found for: CHOL, TRIG, HDL, CHOLHDL, VLDL, LDLCALC, LDLDIRECT  Additional studies/ records that were reviewed today include:   Prior office notes, blood work, EKGs reviewed    ASSESSMENT:    1. Chest pain, atypical   2. Dyspnea, unspecified type   3. Essential hypertension   4. Disequilibrium      PLAN:  In order of problems listed above:  Atypical chest pain/dyspnea with abnormal EKG  - We will proceed with echocardiogram to document her ejection fraction, and look for any valvular abnormalities. I do not appreciate a murmur on exam.  - We will also check a pharmacologic stress test. With her disequilibrium I would not like her to be on a treadmill. Her symptoms could be ischemia. She does have T-wave changes/ST downsloping on EKG. Given her advanced age, I want to start with a noninvasive approach. Obviously if her symptoms progress or become more worrisome because it always consider cardiac catheterization.  - If cardiac workup is unremarkable, could consider further pulmonary evaluation. I do not appreciate any active wheezing on exam.  Disequilibrium/dizziness  - Likely inner ear related/equilibrium system related especially with  her complaint of what sounds like peripheral neuropathy in her feet bilaterally with numbness at night. I do not appreciate any carotid bruits. Her distal pulses, dorsalis pedis are palpable.  Essential hypertension  - Blood pressure is under excellent control. Medications reviewed.  Family history of factor V Leiden deficiency  - Many of her relatives have had blood clots in the past. She is concerned about this.  - She continues to take aspirin.  - Echocardiogram will be helpful in the sense that if her right ventricle is massively dilated, we may wish to proceed with coronary CT to exclude thromboembolic disease.  - On the other hand, she is 83 years old and has not had  a thrombotic event which has good prognostic signs.    Medication Adjustments/Labs and Tests Ordered: Current medicines are reviewed at length with the patient today.  Concerns regarding medicines are outlined above.  Medication changes, Labs and Tests ordered today are listed in the Patient Instructions below. Patient Instructions  Medication Instructions:  The current medical regimen is effective;  continue present plan and medications.  Testing/Procedures: Your physician has requested that you have an echocardiogram. Echocardiography is a painless test that uses sound waves to create images of your heart. It provides your doctor with information about the size and shape of your heart and how well your heart's chambers and valves are working. This procedure takes approximately one hour. There are no restrictions for this procedure.  Your physician has requested that you have a lexiscan myoview. For further information please visit [email or web address]. Please follow instruction sheet, as given.  Follow-Up: Follow up as needed after your testing.  Thank you for choosing [facility]!!         Signed, Candee Furbish, MD  02/23/2017 12:28 PM    [address],  [place], [place]  [address] Phone: [phone]; Fax: [phone]

## 2017-02-23 NOTE — Patient Instructions (Signed)

## 2017-02-23 NOTE — Progress Notes (Signed)
   Subjective:    Patient ID: [name], female    DOB: May 23, 1931, 83 y.o.   MRN: [id number]  HPI: She presents today as a new patient with a chief complaint of a painful area beneath the tip of the toenail left hallux. She states is been there for about a year and his hardware regular closed shoes as well as having any type of pressure on the toe including the bed linen. She also relates some numbness and tingling bilaterally. She states that this is not constant are consistent.    Review of Systems  HENT: Positive for sinus pressure and sneezing.   Eyes: Positive for itching and visual disturbance.  Respiratory: Positive for apnea, chest tightness and shortness of breath.   Endocrine: Positive for polydipsia.  Genitourinary: Positive for frequency.  Musculoskeletal: Positive for back pain, gait problem and myalgias.  Neurological: Positive for dizziness, light-headedness, numbness and headaches.  All other systems reviewed and are negative.      Objective:   Physical Exam: Vital signs are stable she is alert and oriented 3. Pulses are palpable. Neurologic sensorium is intact. Deep tendon reflexes are intact muscle strength was 5 over 5 dorsiflexion plantar flexors inversion everters all into the musculature is intact. Orthopedic evaluation was resolved joints distal to the ankle for range of motion or crepitation. Cutaneous evaluation of a straight supple well-hydrated cutis no erythema edema cellulitis drainage or odor. She has pain on direct palpation of the left hallux from dorsal to plantar. Radiographs lateral view does demonstrate a subungual exostosis quite small but correlates with a divot in the nail and with the pain.        Assessment & Plan:  Assessment: Subungual exostosis hallux left.  Plan: Consented her for subungual exostectomy hallux left today. She understands this is amenable to it we did discuss possible postoperative complications. She signed a revision of  the consent form and I will follow-up with her in the next few weeks for surgical intervention. We did dispense a Darco shoe today. She was provided with both oral and written home going instructions regarding anesthesia and surgery Center. I will follow-up with her sitting

## 2017-02-23 NOTE — Patient Instructions (Signed)
Medication Instructions:  The current medical regimen is effective;  continue present plan and medications.  Testing/Procedures: Your physician has requested that you have an echocardiogram. Echocardiography is a painless test that uses sound waves to create images of your heart. It provides your doctor with information about the size and shape of your heart and how well your heart's chambers and valves are working. This procedure takes approximately one hour. There are no restrictions for this procedure.  Your physician has requested that you have a lexiscan myoview. For further information please visit [email or web address]. Please follow instruction sheet, as given.  Follow-Up: Follow up as needed after your testing.  Thank you for choosing [facility]!!

## 2017-02-24 ENCOUNTER — Telehealth (HOSPITAL_COMMUNITY): Payer: Self-pay | Admitting: *Deleted

## 2017-02-24 NOTE — Telephone Encounter (Signed)
Patient given detailed instructions per Myocardial Perfusion Study Information Sheet for the test on 03/02/17. Patient notified to arrive 15 minutes early and that it is imperative to arrive on time for appointment to keep from having the test rescheduled.  If you need to cancel or reschedule your appointment, please call the office within 24 hours of your appointment. Failure to do so may result in a cancellation of your appointment, and a $50 no show fee. Patient verbalized understanding. Maryela Tapper Jacqueline     

## 2017-02-26 DIAGNOSIS — M8589 Other specified disorders of bone density and structure, multiple sites: Secondary | ICD-10-CM | POA: Diagnosis not present

## 2017-03-02 ENCOUNTER — Ambulatory Visit (HOSPITAL_COMMUNITY): Payer: Medicare HMO | Attending: Cardiology

## 2017-03-02 DIAGNOSIS — R42 Dizziness and giddiness: Secondary | ICD-10-CM | POA: Diagnosis not present

## 2017-03-02 DIAGNOSIS — R0600 Dyspnea, unspecified: Secondary | ICD-10-CM | POA: Diagnosis not present

## 2017-03-02 DIAGNOSIS — R9431 Abnormal electrocardiogram [ECG] [EKG]: Secondary | ICD-10-CM | POA: Diagnosis not present

## 2017-03-02 DIAGNOSIS — I10 Essential (primary) hypertension: Secondary | ICD-10-CM | POA: Diagnosis not present

## 2017-03-02 DIAGNOSIS — R0789 Other chest pain: Secondary | ICD-10-CM | POA: Diagnosis not present

## 2017-03-02 DIAGNOSIS — Z8249 Family history of ischemic heart disease and other diseases of the circulatory system: Secondary | ICD-10-CM | POA: Insufficient documentation

## 2017-03-02 LAB — MYOCARDIAL PERFUSION IMAGING
CHL CUP RESTING HR STRESS: 69 {beats}/min
LHR: 0.31
LVDIAVOL: 83 mL (ref 46–106)
LVSYSVOL: 31 mL
NUC STRESS TID: 0.98
Peak HR: 95 {beats}/min
SDS: 1
SRS: 3
SSS: 4

## 2017-03-02 MED ORDER — TECHNETIUM TC 99M TETROFOSMIN IV KIT
10.8000 | PACK | Freq: Once | INTRAVENOUS | Status: AC | PRN
  Administered 2017-03-02: 10.8 via INTRAVENOUS
  Filled 2017-03-02: qty 11

## 2017-03-02 MED ORDER — TECHNETIUM TC 99M TETROFOSMIN IV KIT
32.9000 | PACK | Freq: Once | INTRAVENOUS | Status: AC | PRN
  Administered 2017-03-02: 32.9 via INTRAVENOUS
  Filled 2017-03-02: qty 33

## 2017-03-02 MED ORDER — REGADENOSON 0.4 MG/5ML IV SOLN
0.4000 mg | Freq: Once | INTRAVENOUS | Status: AC
  Administered 2017-03-02: 0.4 mg via INTRAVENOUS

## 2017-03-09 ENCOUNTER — Ambulatory Visit (HOSPITAL_COMMUNITY): Payer: Medicare HMO | Attending: Internal Medicine

## 2017-03-09 ENCOUNTER — Other Ambulatory Visit: Payer: Self-pay

## 2017-03-09 DIAGNOSIS — E785 Hyperlipidemia, unspecified: Secondary | ICD-10-CM | POA: Insufficient documentation

## 2017-03-09 DIAGNOSIS — I10 Essential (primary) hypertension: Secondary | ICD-10-CM | POA: Insufficient documentation

## 2017-03-09 DIAGNOSIS — R0789 Other chest pain: Secondary | ICD-10-CM | POA: Diagnosis not present

## 2017-03-09 DIAGNOSIS — I071 Rheumatic tricuspid insufficiency: Secondary | ICD-10-CM | POA: Diagnosis not present

## 2017-03-09 DIAGNOSIS — R0600 Dyspnea, unspecified: Secondary | ICD-10-CM | POA: Insufficient documentation

## 2017-04-14 DIAGNOSIS — Z6832 Body mass index (BMI) 32.0-32.9, adult: Secondary | ICD-10-CM | POA: Diagnosis not present

## 2017-04-14 DIAGNOSIS — J019 Acute sinusitis, unspecified: Secondary | ICD-10-CM | POA: Diagnosis not present

## 2017-04-21 ENCOUNTER — Telehealth: Payer: Self-pay | Admitting: Podiatry

## 2017-04-21 ENCOUNTER — Telehealth: Payer: Self-pay | Admitting: *Deleted

## 2017-04-21 NOTE — Telephone Encounter (Signed)
Patient left message wanting to schedule surgery for her left big toe.

## 2017-04-21 NOTE — Telephone Encounter (Signed)
Pt would like to schedule surgery for big toe.

## 2017-04-28 NOTE — Telephone Encounter (Signed)
Patient was scheduled for 05/14/2017.

## 2017-04-29 ENCOUNTER — Ambulatory Visit
Admission: RE | Admit: 2017-04-29 | Discharge: 2017-04-29 | Disposition: A | Discharge status: Home / Self Care | Payer: Medicare HMO | Source: Ambulatory Visit | Attending: Physician Assistant | Admitting: Physician Assistant

## 2017-04-29 ENCOUNTER — Other Ambulatory Visit: Payer: Self-pay | Admitting: Physician Assistant

## 2017-04-29 ENCOUNTER — Other Ambulatory Visit (HOSPITAL_COMMUNITY): Payer: Self-pay | Admitting: Respiratory Therapy

## 2017-04-29 DIAGNOSIS — R0602 Shortness of breath: Secondary | ICD-10-CM | POA: Diagnosis not present

## 2017-04-29 DIAGNOSIS — I10 Essential (primary) hypertension: Secondary | ICD-10-CM | POA: Diagnosis not present

## 2017-04-29 DIAGNOSIS — E669 Obesity, unspecified: Secondary | ICD-10-CM | POA: Diagnosis not present

## 2017-04-29 DIAGNOSIS — Z6832 Body mass index (BMI) 32.0-32.9, adult: Secondary | ICD-10-CM | POA: Diagnosis not present

## 2017-04-29 DIAGNOSIS — D6851 Activated protein C resistance: Secondary | ICD-10-CM | POA: Diagnosis not present

## 2017-04-29 DIAGNOSIS — R05 Cough: Secondary | ICD-10-CM | POA: Diagnosis not present

## 2017-05-06 ENCOUNTER — Ambulatory Visit (HOSPITAL_COMMUNITY)
Admission: RE | Admit: 2017-05-06 | Discharge: 2017-05-06 | Disposition: A | Discharge status: Home / Self Care | Payer: Medicare HMO | Source: Ambulatory Visit | Attending: Physician Assistant | Admitting: Physician Assistant

## 2017-05-06 DIAGNOSIS — R0602 Shortness of breath: Secondary | ICD-10-CM

## 2017-05-06 LAB — SPIROMETRY WITH GRAPH
FEF 25-75 POST: 2.43 L/s
FEF 25-75 PRE: 2.64 L/s
FEF2575-%Change-Post: -7 %
FEF2575-%PRED-PRE: 190 %
FEF2575-%Pred-Post: 175 %
FEV1-%Change-Post: 0 %
FEV1-%PRED-POST: 104 %
FEV1-%PRED-PRE: 104 %
FEV1-POST: 2.19 L
FEV1-Pre: 2.18 L
FEV1FVC-%Change-Post: 0 %
FEV1FVC-%Pred-Pre: 113 %
FEV6-%CHANGE-POST: 0 %
FEV6-%PRED-POST: 97 %
FEV6-%Pred-Pre: 98 %
FEV6-Post: 2.6 L
FEV6-Pre: 2.62 L
FEV6FVC-%CHANGE-POST: 0 %
FEV6FVC-%Pred-Post: 105 %
FEV6FVC-%Pred-Pre: 105 %
FVC-%Change-Post: 0 %
FVC-%PRED-POST: 92 %
FVC-%Pred-Pre: 93 %
FVC-Post: 2.62 L
FVC-Pre: 2.63 L
PRE FEV1/FVC RATIO: 83 %
Post FEV1/FVC ratio: 84 %
Post FEV6/FVC ratio: 99 %
Pre FEV6/FVC Ratio: 100 %

## 2017-05-06 MED ORDER — ALBUTEROL SULFATE (2.5 MG/3ML) 0.083% IN NEBU
2.5000 mg | INHALATION_SOLUTION | Freq: Once | RESPIRATORY_TRACT | Status: AC
  Administered 2017-05-06: 2.5 mg via RESPIRATORY_TRACT

## 2017-05-11 ENCOUNTER — Telehealth: Payer: Self-pay | Admitting: *Deleted

## 2017-05-11 NOTE — Telephone Encounter (Signed)
"  I need to know the address of the place my mom is supposed to go for her surgery on Friday."  She is supposed to go to [address].  "Okay, thank you."

## 2017-05-12 ENCOUNTER — Other Ambulatory Visit: Payer: Self-pay | Admitting: Podiatry

## 2017-05-12 MED ORDER — HYDROMORPHONE HCL 4 MG PO TABS: 4.0000 mg | ORAL_TABLET | ORAL | 0 refills | Status: DC | PRN

## 2017-05-12 MED ORDER — CLINDAMYCIN HCL 150 MG PO CAPS: 150.0000 mg | ORAL_CAPSULE | Freq: Three times a day (TID) | ORAL | 0 refills | Status: DC

## 2017-05-12 MED ORDER — ONDANSETRON HCL 4 MG PO TABS: 4.0000 mg | ORAL_TABLET | Freq: Three times a day (TID) | ORAL | 0 refills | Status: DC | PRN

## 2017-05-14 ENCOUNTER — Encounter: Payer: Self-pay | Admitting: Podiatry

## 2017-05-14 DIAGNOSIS — I10 Essential (primary) hypertension: Secondary | ICD-10-CM | POA: Diagnosis not present

## 2017-05-14 DIAGNOSIS — D1632 Benign neoplasm of short bones of left lower limb: Secondary | ICD-10-CM | POA: Diagnosis not present

## 2017-05-14 DIAGNOSIS — M2040 Other hammer toe(s) (acquired), unspecified foot: Secondary | ICD-10-CM | POA: Diagnosis not present

## 2017-05-14 DIAGNOSIS — M25775 Osteophyte, left foot: Secondary | ICD-10-CM | POA: Diagnosis not present

## 2017-05-17 ENCOUNTER — Telehealth: Payer: Self-pay | Admitting: *Deleted

## 2017-05-17 ENCOUNTER — Telehealth: Payer: Self-pay | Admitting: Podiatry

## 2017-05-17 MED ORDER — DOXYCYCLINE HYCLATE 100 MG PO CAPS: 100.0000 mg | ORAL_CAPSULE | Freq: Two times a day (BID) | ORAL | 0 refills | Status: DC

## 2017-05-17 NOTE — Telephone Encounter (Signed)
Pt called and is having an allergic reaction to the antibiotic that she was given last Friday after surgery. Its making her sick. Can we call in another antibiotic for her to [facility].

## 2017-05-17 NOTE — Telephone Encounter (Addendum)
Pt states the clindamycin is causing her to be sick to her stomach. Pt states has taken pain medication before without problem, the the clindamycin is bother her even with the nausea medication. Left message informing pt of Dr. Stephenie Acres doxycycline orders and not to take with any other medications. Pt's dtr asked that the medication be called to the [facility].

## 2017-05-17 NOTE — Telephone Encounter (Signed)
doxy 

## 2017-05-19 MED ORDER — DOXYCYCLINE HYCLATE 100 MG PO CAPS: 100.0000 mg | ORAL_CAPSULE | Freq: Two times a day (BID) | ORAL | 0 refills | Status: DC

## 2017-05-19 NOTE — Telephone Encounter (Signed)
pts daughter called and said the [facility] did not have it. Can you please resend it.

## 2017-05-20 ENCOUNTER — Encounter: Payer: Self-pay | Admitting: Podiatry

## 2017-05-20 ENCOUNTER — Ambulatory Visit (INDEPENDENT_AMBULATORY_CARE_PROVIDER_SITE_OTHER): Payer: Medicare HMO

## 2017-05-20 ENCOUNTER — Ambulatory Visit (INDEPENDENT_AMBULATORY_CARE_PROVIDER_SITE_OTHER): Payer: Self-pay | Admitting: Podiatry

## 2017-05-20 VITALS — BP 141/79 | HR 78 | Temp 96.3°F

## 2017-05-20 DIAGNOSIS — M898X9 Other specified disorders of bone, unspecified site: Secondary | ICD-10-CM

## 2017-05-20 NOTE — Progress Notes (Signed)
She presents today for follow-up of her subungual exostectomy left hallux. She states that she is doing quite well is really not very tender but she does have some discoloration now.  Objective: Vascular dressing intact was removed does demonstrate subungual hematoma and sutures are intact. Radiograph demonstrates complete resection of dorsal exostosis.  Assessment: Exostectomy hallux left.  Plan: Sutures removed next week. May need to consider removing the nail at a later date. I don't want to remove the nail at this point because of the possible infection associated with an open nail bed.

## 2017-05-27 ENCOUNTER — Ambulatory Visit (INDEPENDENT_AMBULATORY_CARE_PROVIDER_SITE_OTHER): Payer: Medicare HMO | Admitting: Podiatry

## 2017-05-27 ENCOUNTER — Encounter: Payer: Self-pay | Admitting: Podiatry

## 2017-05-27 DIAGNOSIS — M898X9 Other specified disorders of bone, unspecified site: Secondary | ICD-10-CM

## 2017-05-27 DIAGNOSIS — L601 Onycholysis: Secondary | ICD-10-CM | POA: Diagnosis not present

## 2017-05-27 MED ORDER — DOXYCYCLINE HYCLATE 100 MG PO CAPS: 100.0000 mg | ORAL_CAPSULE | Freq: Two times a day (BID) | ORAL | 0 refills | Status: DC

## 2017-05-27 NOTE — Patient Instructions (Signed)

## 2017-05-28 NOTE — Progress Notes (Signed)
DOS 06.15.2018 Subungual exostectomy left hallux.

## 2017-05-28 NOTE — Progress Notes (Signed)
Subjective: [name] is a 83 y.o. is seen today in office s/p left hallux exostectomy with Dr. Milinda Pointer. She presents today for suture removal. She does have new concerns today however. She states that the toenail was lifting up causing a blister and she did have to drain [place] area herself and she states she had a drain the toenail yesterday. Since then she has noticed drainage from around the toenail has been somewhat red. Denies any pus and denies any red streaks. Denies any systemic complaints such as fevers, chills, nausea, vomiting. No calf pain, chest pain, shortness of breath.   Objective: General: No acute distress, AAOx3  DP/PT pulses palpable 2/4, CRT < 3 sec to all digits.  Protective sensation intact. Motor function intact.  Right foot: Incision is well coapted without any evidence of dehiscence and sutures are intact. There is no surrounding erythema, ascending cellulitis, fluctuance, crepitus, malodor, drainage/purulence from the incision. However around the toenail does appear to be localized edema and erythema tracking on the toenail on the nail is loose and the underlying nail bed proximally and there is a wound on the proximal nail bed where she did drain the toenail/also it appears to be a blister. No other areas of tenderness to bilateral lower extremities.  No other open lesions or pre-ulcerative lesions.  No pain with calf compression, swelling, warmth, erythema.   Assessment and Plan:  Status post left hallux exosectomy, with onycholysis, localized infection on the toenail.  -Treatment options discussed including all alternatives, risks, and complications -At this point I discussed the total nail avulsion. No did not want to do this with her today given the recent surgery and the incision however the toenail is loose and there is drainage underneath the toenail is redness on the toenail therefore think he has wanted to go ahead and remove the nail today. Understanding risks of  potentially doing this today she wishes to have them proceed. I think that we don't of the toenail off this infection will worsen. She wishes to proceed. Under sterile conditions mature lidocaine and Marcaine plain was infiltrated. Once anesthetized the skin was prepped in sterile fashion and a tourniquet was applied. Next the left hallux toenails and easily removed an eschar to be significant hematoma underneath the toenail. This is debrided there is no laceration of the nail there is no opening within the skin. There is no purulence identified in the procedure. The area was cleaned and hemostasis was achieved. Betadine ointment was applied followed by dressing. Tourniquet was released there was an immediate capillary refill time to the toe. Also after this I did bandage the incision separately and the incision remained well coapted. Post procedure tractions were discussed. Monitor for infection. -Continue antibiotics. Refilled doxycycline. -Ice/elevation -Pain medication as needed. -Monitor for any clinical signs or symptoms of infection and DVT/PE and directed to call the office immediately should any occur or go to the ER. -Follow-up in 1 week or sooner if any problems arise. In the meantime, encouraged to call the office with any questions, concerns, change in symptoms.   Celesta Gentile, DPM

## 2017-06-01 ENCOUNTER — Ambulatory Visit (INDEPENDENT_AMBULATORY_CARE_PROVIDER_SITE_OTHER): Payer: Medicare HMO | Admitting: Podiatry

## 2017-06-01 DIAGNOSIS — M898X9 Other specified disorders of bone, unspecified site: Secondary | ICD-10-CM

## 2017-06-01 NOTE — Progress Notes (Signed)
She presents today for follow-up of her avulsion of the nail hallux left. She states is a little bit tender but all in all it seems to be doing pretty well.  Objective: Vital signs are stable she's alert and oriented 3. Mildly edematous slightly erythematous toe no signs of infection this point. Granulation tissue has started to develop with epithelialization.  Assessment: Well-healing surgical toe status post nail avulsion and digital exostectomy..  Plan: Soaking once a day rather than twice a day. Cover during the day with a light bandage as well as nighttime. Follow-up with Dr. Jacqualyn Posey in 1 week just to assure this is getting better.

## 2017-06-07 ENCOUNTER — Ambulatory Visit (INDEPENDENT_AMBULATORY_CARE_PROVIDER_SITE_OTHER): Payer: Self-pay | Admitting: Podiatry

## 2017-06-07 DIAGNOSIS — L601 Onycholysis: Secondary | ICD-10-CM

## 2017-06-07 DIAGNOSIS — M898X9 Other specified disorders of bone, unspecified site: Secondary | ICD-10-CM

## 2017-06-07 NOTE — Progress Notes (Signed)
Subjective: 83 year old female presents the office they for follow-up evaluation status post left total hallux nail avulsion due to localized infection and she also had an exostectomy preformed. She states that she is doing better but the area still throbs at times. She has continue soaking in Epsom salts cover with antibiotic ointment and a bandage. She denies any drainage or pus. She denies any red streaking. Denies any systemic complaints such as fevers, chills, nausea, vomiting. No acute changes since last appointment, and no other complaints at this time.   Objective: AAO x3, NAD DP/PT pulses palpable bilaterally, CRT less than 3 seconds Incision site from the exostectomy appears to be well-healed. Along the nail procedure site the nail bed is granular and there is a small scab as well as epithelialization along the area. There is no drainage or pus there is no swelling erythema or ascending cellulitis. Is no conical signs of infection noted. Mild to palpation along the procedure site. No open lesions or pre-ulcerative lesions.  No pain with calf compression, swelling, warmth, erythema  Assessment: Status post total nail avulsion left hallux as well as exostectomy  Plan: -All treatment options discussed with the patient including all alternatives, risks, complications.  -Continue soaking in Epson salts once a day, antibiotic ointment and a bandage in the day believe the area uncovered at night. Monitor for signs or symptoms of infection. Remaining surgical shoe. -Follow-up 2 weeks. -Patient encouraged to call the office with any questions, concerns, change in symptoms.   Celesta Gentile, DPM

## 2017-06-07 NOTE — Patient Instructions (Signed)

## 2017-06-10 DIAGNOSIS — R0602 Shortness of breath: Secondary | ICD-10-CM | POA: Diagnosis not present

## 2017-06-21 ENCOUNTER — Ambulatory Visit: Payer: Medicare HMO

## 2017-06-24 ENCOUNTER — Ambulatory Visit (INDEPENDENT_AMBULATORY_CARE_PROVIDER_SITE_OTHER): Payer: Medicare HMO

## 2017-06-24 ENCOUNTER — Ambulatory Visit (INDEPENDENT_AMBULATORY_CARE_PROVIDER_SITE_OTHER): Payer: Self-pay | Admitting: Podiatry

## 2017-06-24 DIAGNOSIS — M898X9 Other specified disorders of bone, unspecified site: Secondary | ICD-10-CM

## 2017-06-25 NOTE — Progress Notes (Signed)
She presents today for follow-up of a nonhealing wound nail bed hallux left status post dorsal exostectomy. She states this seems to be doing better is less tender and does not appear to be as swollen.  Objective: Vital signs are stable she is alert and oriented 3. Pulses are palpable. Infection appears to be resolving and there is just a small area measuring less than 8 mm in diameter to the dorsal aspect of the nailbed. There is no purulence no malodor and erythema has diminished considerably.  Assessment: Slowly healing ulceration dorsal aspect hallux left.  Plan: Recommend that she only soak every other day Epsom salts water cover daily with a Band-Aid. Leave it open at bedtime. Follow up with her in 2-3 weeks.

## 2017-07-08 ENCOUNTER — Ambulatory Visit (INDEPENDENT_AMBULATORY_CARE_PROVIDER_SITE_OTHER): Payer: Medicare HMO | Admitting: Podiatry

## 2017-07-08 ENCOUNTER — Encounter: Payer: Self-pay | Admitting: Podiatry

## 2017-07-08 DIAGNOSIS — M898X9 Other specified disorders of bone, unspecified site: Secondary | ICD-10-CM

## 2017-07-10 NOTE — Progress Notes (Signed)
She presents today for follow-up of a superficial ulceration that occurred to the hallux left after exostectomy performed on 05/14/2017. She states it is healing but is slowly healing.  Objective: Vital signs are stable much improved from last visit. Pulses remain palpable there's no signs of infection swelling the toe has diminished considerably.  Assessment: Well-healing surgical toe hallux left.  Plan: Continue current therapies follow up with me in 4-6 weeks.

## 2017-08-05 ENCOUNTER — Encounter: Payer: Medicare HMO | Admitting: Podiatry

## 2017-08-18 DIAGNOSIS — I10 Essential (primary) hypertension: Secondary | ICD-10-CM | POA: Diagnosis not present

## 2017-08-18 DIAGNOSIS — E782 Mixed hyperlipidemia: Secondary | ICD-10-CM | POA: Diagnosis not present

## 2017-08-19 DIAGNOSIS — I10 Essential (primary) hypertension: Secondary | ICD-10-CM | POA: Diagnosis not present

## 2017-08-19 DIAGNOSIS — R0602 Shortness of breath: Secondary | ICD-10-CM | POA: Diagnosis not present

## 2017-08-19 DIAGNOSIS — E782 Mixed hyperlipidemia: Secondary | ICD-10-CM | POA: Diagnosis not present

## 2017-08-19 DIAGNOSIS — R7303 Prediabetes: Secondary | ICD-10-CM | POA: Diagnosis not present

## 2017-08-19 DIAGNOSIS — N183 Chronic kidney disease, stage 3 (moderate): Secondary | ICD-10-CM | POA: Diagnosis not present

## 2017-08-19 DIAGNOSIS — Z6832 Body mass index (BMI) 32.0-32.9, adult: Secondary | ICD-10-CM | POA: Diagnosis not present

## 2017-10-06 DIAGNOSIS — Z803 Family history of malignant neoplasm of breast: Secondary | ICD-10-CM | POA: Diagnosis not present

## 2017-10-06 DIAGNOSIS — Z1231 Encounter for screening mammogram for malignant neoplasm of breast: Secondary | ICD-10-CM | POA: Diagnosis not present

## 2017-10-08 DIAGNOSIS — Z23 Encounter for immunization: Secondary | ICD-10-CM | POA: Diagnosis not present

## 2017-12-28 DIAGNOSIS — M48061 Spinal stenosis, lumbar region without neurogenic claudication: Secondary | ICD-10-CM | POA: Diagnosis not present

## 2017-12-28 DIAGNOSIS — L820 Inflamed seborrheic keratosis: Secondary | ICD-10-CM | POA: Diagnosis not present

## 2017-12-28 DIAGNOSIS — Z139 Encounter for screening, unspecified: Secondary | ICD-10-CM | POA: Diagnosis not present

## 2017-12-28 DIAGNOSIS — R0602 Shortness of breath: Secondary | ICD-10-CM | POA: Diagnosis not present

## 2017-12-28 DIAGNOSIS — Z9181 History of falling: Secondary | ICD-10-CM | POA: Diagnosis not present

## 2017-12-28 DIAGNOSIS — Z6833 Body mass index (BMI) 33.0-33.9, adult: Secondary | ICD-10-CM | POA: Diagnosis not present

## 2017-12-28 DIAGNOSIS — Z1331 Encounter for screening for depression: Secondary | ICD-10-CM | POA: Diagnosis not present

## 2018-01-10 DIAGNOSIS — M545 Low back pain: Secondary | ICD-10-CM | POA: Diagnosis not present

## 2018-01-10 DIAGNOSIS — R262 Difficulty in walking, not elsewhere classified: Secondary | ICD-10-CM | POA: Diagnosis not present

## 2018-01-12 DIAGNOSIS — M545 Low back pain: Secondary | ICD-10-CM | POA: Diagnosis not present

## 2018-01-12 DIAGNOSIS — R262 Difficulty in walking, not elsewhere classified: Secondary | ICD-10-CM | POA: Diagnosis not present

## 2018-01-18 DIAGNOSIS — M545 Low back pain: Secondary | ICD-10-CM | POA: Diagnosis not present

## 2018-01-18 DIAGNOSIS — R262 Difficulty in walking, not elsewhere classified: Secondary | ICD-10-CM | POA: Diagnosis not present

## 2018-01-25 DIAGNOSIS — R262 Difficulty in walking, not elsewhere classified: Secondary | ICD-10-CM | POA: Diagnosis not present

## 2018-01-25 DIAGNOSIS — M545 Low back pain: Secondary | ICD-10-CM | POA: Diagnosis not present

## 2018-01-28 DIAGNOSIS — R262 Difficulty in walking, not elsewhere classified: Secondary | ICD-10-CM | POA: Diagnosis not present

## 2018-01-28 DIAGNOSIS — M545 Low back pain: Secondary | ICD-10-CM | POA: Diagnosis not present

## 2018-02-01 DIAGNOSIS — R262 Difficulty in walking, not elsewhere classified: Secondary | ICD-10-CM | POA: Diagnosis not present

## 2018-02-01 DIAGNOSIS — M545 Low back pain: Secondary | ICD-10-CM | POA: Diagnosis not present

## 2018-02-04 DIAGNOSIS — M545 Low back pain: Secondary | ICD-10-CM | POA: Diagnosis not present

## 2018-02-04 DIAGNOSIS — R262 Difficulty in walking, not elsewhere classified: Secondary | ICD-10-CM | POA: Diagnosis not present

## 2018-02-07 DIAGNOSIS — H52223 Regular astigmatism, bilateral: Secondary | ICD-10-CM | POA: Diagnosis not present

## 2018-02-07 DIAGNOSIS — Z961 Presence of intraocular lens: Secondary | ICD-10-CM | POA: Diagnosis not present

## 2018-02-07 DIAGNOSIS — D3132 Benign neoplasm of left choroid: Secondary | ICD-10-CM | POA: Diagnosis not present

## 2018-02-07 DIAGNOSIS — H5213 Myopia, bilateral: Secondary | ICD-10-CM | POA: Diagnosis not present

## 2018-02-07 DIAGNOSIS — H524 Presbyopia: Secondary | ICD-10-CM | POA: Diagnosis not present

## 2018-02-07 DIAGNOSIS — R262 Difficulty in walking, not elsewhere classified: Secondary | ICD-10-CM | POA: Diagnosis not present

## 2018-02-07 DIAGNOSIS — D3131 Benign neoplasm of right choroid: Secondary | ICD-10-CM | POA: Diagnosis not present

## 2018-02-07 DIAGNOSIS — M545 Low back pain: Secondary | ICD-10-CM | POA: Diagnosis not present

## 2018-02-07 DIAGNOSIS — H04123 Dry eye syndrome of bilateral lacrimal glands: Secondary | ICD-10-CM | POA: Diagnosis not present

## 2018-02-11 DIAGNOSIS — M545 Low back pain: Secondary | ICD-10-CM | POA: Diagnosis not present

## 2018-02-11 DIAGNOSIS — R262 Difficulty in walking, not elsewhere classified: Secondary | ICD-10-CM | POA: Diagnosis not present

## 2018-02-15 DIAGNOSIS — R262 Difficulty in walking, not elsewhere classified: Secondary | ICD-10-CM | POA: Diagnosis not present

## 2018-02-15 DIAGNOSIS — M545 Low back pain: Secondary | ICD-10-CM | POA: Diagnosis not present

## 2018-02-18 DIAGNOSIS — E782 Mixed hyperlipidemia: Secondary | ICD-10-CM | POA: Diagnosis not present

## 2018-02-18 DIAGNOSIS — N183 Chronic kidney disease, stage 3 (moderate): Secondary | ICD-10-CM | POA: Diagnosis not present

## 2018-02-18 DIAGNOSIS — M545 Low back pain: Secondary | ICD-10-CM | POA: Diagnosis not present

## 2018-02-18 DIAGNOSIS — R262 Difficulty in walking, not elsewhere classified: Secondary | ICD-10-CM | POA: Diagnosis not present

## 2018-02-18 DIAGNOSIS — Z79899 Other long term (current) drug therapy: Secondary | ICD-10-CM | POA: Diagnosis not present

## 2018-02-22 DIAGNOSIS — R0602 Shortness of breath: Secondary | ICD-10-CM | POA: Diagnosis not present

## 2018-02-22 DIAGNOSIS — M48061 Spinal stenosis, lumbar region without neurogenic claudication: Secondary | ICD-10-CM | POA: Diagnosis not present

## 2018-02-22 DIAGNOSIS — E782 Mixed hyperlipidemia: Secondary | ICD-10-CM | POA: Diagnosis not present

## 2018-02-22 DIAGNOSIS — R7303 Prediabetes: Secondary | ICD-10-CM | POA: Diagnosis not present

## 2018-02-22 DIAGNOSIS — Z6832 Body mass index (BMI) 32.0-32.9, adult: Secondary | ICD-10-CM | POA: Diagnosis not present

## 2018-02-22 DIAGNOSIS — R262 Difficulty in walking, not elsewhere classified: Secondary | ICD-10-CM | POA: Diagnosis not present

## 2018-02-22 DIAGNOSIS — M545 Low back pain: Secondary | ICD-10-CM | POA: Diagnosis not present

## 2018-02-22 DIAGNOSIS — I10 Essential (primary) hypertension: Secondary | ICD-10-CM | POA: Diagnosis not present

## 2018-02-25 DIAGNOSIS — M545 Low back pain: Secondary | ICD-10-CM | POA: Diagnosis not present

## 2018-02-25 DIAGNOSIS — R262 Difficulty in walking, not elsewhere classified: Secondary | ICD-10-CM | POA: Diagnosis not present

## 2018-03-01 DIAGNOSIS — M545 Low back pain: Secondary | ICD-10-CM | POA: Diagnosis not present

## 2018-03-01 DIAGNOSIS — R262 Difficulty in walking, not elsewhere classified: Secondary | ICD-10-CM | POA: Diagnosis not present

## 2018-03-03 DIAGNOSIS — R262 Difficulty in walking, not elsewhere classified: Secondary | ICD-10-CM | POA: Diagnosis not present

## 2018-03-03 DIAGNOSIS — M545 Low back pain: Secondary | ICD-10-CM | POA: Diagnosis not present

## 2018-04-28 DIAGNOSIS — R262 Difficulty in walking, not elsewhere classified: Secondary | ICD-10-CM | POA: Diagnosis not present

## 2018-04-28 DIAGNOSIS — M545 Low back pain: Secondary | ICD-10-CM | POA: Diagnosis not present

## 2018-05-03 DIAGNOSIS — M545 Low back pain: Secondary | ICD-10-CM | POA: Diagnosis not present

## 2018-05-03 DIAGNOSIS — R262 Difficulty in walking, not elsewhere classified: Secondary | ICD-10-CM | POA: Diagnosis not present

## 2018-05-06 DIAGNOSIS — M545 Low back pain: Secondary | ICD-10-CM | POA: Diagnosis not present

## 2018-05-06 DIAGNOSIS — R262 Difficulty in walking, not elsewhere classified: Secondary | ICD-10-CM | POA: Diagnosis not present

## 2018-05-17 DIAGNOSIS — R262 Difficulty in walking, not elsewhere classified: Secondary | ICD-10-CM | POA: Diagnosis not present

## 2018-05-17 DIAGNOSIS — M545 Low back pain: Secondary | ICD-10-CM | POA: Diagnosis not present

## 2018-05-20 DIAGNOSIS — M545 Low back pain: Secondary | ICD-10-CM | POA: Diagnosis not present

## 2018-05-20 DIAGNOSIS — R262 Difficulty in walking, not elsewhere classified: Secondary | ICD-10-CM | POA: Diagnosis not present

## 2018-05-24 DIAGNOSIS — M545 Low back pain: Secondary | ICD-10-CM | POA: Diagnosis not present

## 2018-05-24 DIAGNOSIS — R262 Difficulty in walking, not elsewhere classified: Secondary | ICD-10-CM | POA: Diagnosis not present

## 2018-06-07 DIAGNOSIS — M545 Low back pain: Secondary | ICD-10-CM | POA: Diagnosis not present

## 2018-06-07 DIAGNOSIS — R262 Difficulty in walking, not elsewhere classified: Secondary | ICD-10-CM | POA: Diagnosis not present

## 2018-06-09 DIAGNOSIS — M545 Low back pain: Secondary | ICD-10-CM | POA: Diagnosis not present

## 2018-06-09 DIAGNOSIS — R262 Difficulty in walking, not elsewhere classified: Secondary | ICD-10-CM | POA: Diagnosis not present

## 2018-06-15 DIAGNOSIS — M545 Low back pain: Secondary | ICD-10-CM | POA: Diagnosis not present

## 2018-06-15 DIAGNOSIS — R262 Difficulty in walking, not elsewhere classified: Secondary | ICD-10-CM | POA: Diagnosis not present

## 2018-06-21 DIAGNOSIS — R262 Difficulty in walking, not elsewhere classified: Secondary | ICD-10-CM | POA: Diagnosis not present

## 2018-06-21 DIAGNOSIS — M545 Low back pain: Secondary | ICD-10-CM | POA: Diagnosis not present

## 2018-06-23 DIAGNOSIS — R262 Difficulty in walking, not elsewhere classified: Secondary | ICD-10-CM | POA: Diagnosis not present

## 2018-06-23 DIAGNOSIS — M545 Low back pain: Secondary | ICD-10-CM | POA: Diagnosis not present

## 2018-06-28 DIAGNOSIS — M4807 Spinal stenosis, lumbosacral region: Secondary | ICD-10-CM | POA: Diagnosis not present

## 2018-06-28 DIAGNOSIS — R262 Difficulty in walking, not elsewhere classified: Secondary | ICD-10-CM | POA: Diagnosis not present

## 2018-06-28 DIAGNOSIS — M545 Low back pain: Secondary | ICD-10-CM | POA: Diagnosis not present

## 2018-06-30 DIAGNOSIS — M4807 Spinal stenosis, lumbosacral region: Secondary | ICD-10-CM | POA: Diagnosis not present

## 2018-06-30 DIAGNOSIS — M545 Low back pain: Secondary | ICD-10-CM | POA: Diagnosis not present

## 2018-06-30 DIAGNOSIS — R262 Difficulty in walking, not elsewhere classified: Secondary | ICD-10-CM | POA: Diagnosis not present

## 2018-08-24 DIAGNOSIS — E782 Mixed hyperlipidemia: Secondary | ICD-10-CM | POA: Diagnosis not present

## 2018-08-24 DIAGNOSIS — N183 Chronic kidney disease, stage 3 (moderate): Secondary | ICD-10-CM | POA: Diagnosis not present

## 2018-08-24 DIAGNOSIS — Z79899 Other long term (current) drug therapy: Secondary | ICD-10-CM | POA: Diagnosis not present

## 2018-08-30 DIAGNOSIS — R7303 Prediabetes: Secondary | ICD-10-CM | POA: Diagnosis not present

## 2018-08-30 DIAGNOSIS — R0602 Shortness of breath: Secondary | ICD-10-CM | POA: Diagnosis not present

## 2018-08-30 DIAGNOSIS — Z23 Encounter for immunization: Secondary | ICD-10-CM | POA: Diagnosis not present

## 2018-08-30 DIAGNOSIS — M48061 Spinal stenosis, lumbar region without neurogenic claudication: Secondary | ICD-10-CM | POA: Diagnosis not present

## 2018-08-30 DIAGNOSIS — I10 Essential (primary) hypertension: Secondary | ICD-10-CM | POA: Diagnosis not present

## 2018-08-30 DIAGNOSIS — E782 Mixed hyperlipidemia: Secondary | ICD-10-CM | POA: Diagnosis not present

## 2018-09-29 DIAGNOSIS — J209 Acute bronchitis, unspecified: Secondary | ICD-10-CM | POA: Diagnosis not present

## 2018-09-29 DIAGNOSIS — J019 Acute sinusitis, unspecified: Secondary | ICD-10-CM | POA: Diagnosis not present

## 2018-10-18 DIAGNOSIS — Z1231 Encounter for screening mammogram for malignant neoplasm of breast: Secondary | ICD-10-CM | POA: Diagnosis not present

## 2018-10-18 DIAGNOSIS — Z803 Family history of malignant neoplasm of breast: Secondary | ICD-10-CM | POA: Diagnosis not present

## 2018-11-16 DIAGNOSIS — M48061 Spinal stenosis, lumbar region without neurogenic claudication: Secondary | ICD-10-CM | POA: Diagnosis not present

## 2018-11-16 DIAGNOSIS — G629 Polyneuropathy, unspecified: Secondary | ICD-10-CM | POA: Diagnosis not present

## 2018-11-16 DIAGNOSIS — M79671 Pain in right foot: Secondary | ICD-10-CM | POA: Diagnosis not present

## 2019-01-04 DIAGNOSIS — G629 Polyneuropathy, unspecified: Secondary | ICD-10-CM | POA: Diagnosis not present

## 2019-01-04 DIAGNOSIS — M79671 Pain in right foot: Secondary | ICD-10-CM | POA: Diagnosis not present

## 2019-01-04 DIAGNOSIS — M48061 Spinal stenosis, lumbar region without neurogenic claudication: Secondary | ICD-10-CM | POA: Diagnosis not present

## 2019-01-10 DIAGNOSIS — G629 Polyneuropathy, unspecified: Secondary | ICD-10-CM | POA: Diagnosis not present

## 2019-01-10 DIAGNOSIS — M48061 Spinal stenosis, lumbar region without neurogenic claudication: Secondary | ICD-10-CM | POA: Diagnosis not present

## 2019-01-10 DIAGNOSIS — M79671 Pain in right foot: Secondary | ICD-10-CM | POA: Diagnosis not present

## 2019-01-12 DIAGNOSIS — G629 Polyneuropathy, unspecified: Secondary | ICD-10-CM | POA: Diagnosis not present

## 2019-01-12 DIAGNOSIS — M79671 Pain in right foot: Secondary | ICD-10-CM | POA: Diagnosis not present

## 2019-01-12 DIAGNOSIS — M48061 Spinal stenosis, lumbar region without neurogenic claudication: Secondary | ICD-10-CM | POA: Diagnosis not present

## 2019-01-17 DIAGNOSIS — M48061 Spinal stenosis, lumbar region without neurogenic claudication: Secondary | ICD-10-CM | POA: Diagnosis not present

## 2019-01-17 DIAGNOSIS — M79671 Pain in right foot: Secondary | ICD-10-CM | POA: Diagnosis not present

## 2019-01-17 DIAGNOSIS — G629 Polyneuropathy, unspecified: Secondary | ICD-10-CM | POA: Diagnosis not present

## 2019-01-19 DIAGNOSIS — M48061 Spinal stenosis, lumbar region without neurogenic claudication: Secondary | ICD-10-CM | POA: Diagnosis not present

## 2019-01-19 DIAGNOSIS — M79671 Pain in right foot: Secondary | ICD-10-CM | POA: Diagnosis not present

## 2019-01-19 DIAGNOSIS — G629 Polyneuropathy, unspecified: Secondary | ICD-10-CM | POA: Diagnosis not present

## 2019-01-24 DIAGNOSIS — M48061 Spinal stenosis, lumbar region without neurogenic claudication: Secondary | ICD-10-CM | POA: Diagnosis not present

## 2019-01-24 DIAGNOSIS — M79671 Pain in right foot: Secondary | ICD-10-CM | POA: Diagnosis not present

## 2019-01-24 DIAGNOSIS — G629 Polyneuropathy, unspecified: Secondary | ICD-10-CM | POA: Diagnosis not present

## 2019-01-26 DIAGNOSIS — M48061 Spinal stenosis, lumbar region without neurogenic claudication: Secondary | ICD-10-CM | POA: Diagnosis not present

## 2019-01-26 DIAGNOSIS — G629 Polyneuropathy, unspecified: Secondary | ICD-10-CM | POA: Diagnosis not present

## 2019-01-26 DIAGNOSIS — M79671 Pain in right foot: Secondary | ICD-10-CM | POA: Diagnosis not present

## 2019-01-31 DIAGNOSIS — M79671 Pain in right foot: Secondary | ICD-10-CM | POA: Diagnosis not present

## 2019-01-31 DIAGNOSIS — M48061 Spinal stenosis, lumbar region without neurogenic claudication: Secondary | ICD-10-CM | POA: Diagnosis not present

## 2019-01-31 DIAGNOSIS — G629 Polyneuropathy, unspecified: Secondary | ICD-10-CM | POA: Diagnosis not present

## 2019-02-02 DIAGNOSIS — M79671 Pain in right foot: Secondary | ICD-10-CM | POA: Diagnosis not present

## 2019-02-02 DIAGNOSIS — G629 Polyneuropathy, unspecified: Secondary | ICD-10-CM | POA: Diagnosis not present

## 2019-02-02 DIAGNOSIS — M48061 Spinal stenosis, lumbar region without neurogenic claudication: Secondary | ICD-10-CM | POA: Diagnosis not present

## 2019-02-06 DIAGNOSIS — G629 Polyneuropathy, unspecified: Secondary | ICD-10-CM | POA: Diagnosis not present

## 2019-02-06 DIAGNOSIS — M48061 Spinal stenosis, lumbar region without neurogenic claudication: Secondary | ICD-10-CM | POA: Diagnosis not present

## 2019-02-06 DIAGNOSIS — M79671 Pain in right foot: Secondary | ICD-10-CM | POA: Diagnosis not present

## 2019-02-09 DIAGNOSIS — M48061 Spinal stenosis, lumbar region without neurogenic claudication: Secondary | ICD-10-CM | POA: Diagnosis not present

## 2019-02-09 DIAGNOSIS — M79671 Pain in right foot: Secondary | ICD-10-CM | POA: Diagnosis not present

## 2019-02-09 DIAGNOSIS — G629 Polyneuropathy, unspecified: Secondary | ICD-10-CM | POA: Diagnosis not present

## 2019-02-14 DIAGNOSIS — M79671 Pain in right foot: Secondary | ICD-10-CM | POA: Diagnosis not present

## 2019-02-14 DIAGNOSIS — M48061 Spinal stenosis, lumbar region without neurogenic claudication: Secondary | ICD-10-CM | POA: Diagnosis not present

## 2019-02-14 DIAGNOSIS — G629 Polyneuropathy, unspecified: Secondary | ICD-10-CM | POA: Diagnosis not present

## 2019-02-16 DIAGNOSIS — M79671 Pain in right foot: Secondary | ICD-10-CM | POA: Diagnosis not present

## 2019-02-16 DIAGNOSIS — G629 Polyneuropathy, unspecified: Secondary | ICD-10-CM | POA: Diagnosis not present

## 2019-02-16 DIAGNOSIS — M48061 Spinal stenosis, lumbar region without neurogenic claudication: Secondary | ICD-10-CM | POA: Diagnosis not present

## 2019-02-21 DIAGNOSIS — G629 Polyneuropathy, unspecified: Secondary | ICD-10-CM | POA: Diagnosis not present

## 2019-02-21 DIAGNOSIS — M79671 Pain in right foot: Secondary | ICD-10-CM | POA: Diagnosis not present

## 2019-02-21 DIAGNOSIS — M48061 Spinal stenosis, lumbar region without neurogenic claudication: Secondary | ICD-10-CM | POA: Diagnosis not present

## 2019-02-23 DIAGNOSIS — G629 Polyneuropathy, unspecified: Secondary | ICD-10-CM | POA: Diagnosis not present

## 2019-02-23 DIAGNOSIS — M79671 Pain in right foot: Secondary | ICD-10-CM | POA: Diagnosis not present

## 2019-02-23 DIAGNOSIS — M48061 Spinal stenosis, lumbar region without neurogenic claudication: Secondary | ICD-10-CM | POA: Diagnosis not present

## 2019-02-28 DIAGNOSIS — M48061 Spinal stenosis, lumbar region without neurogenic claudication: Secondary | ICD-10-CM | POA: Diagnosis not present

## 2019-02-28 DIAGNOSIS — G629 Polyneuropathy, unspecified: Secondary | ICD-10-CM | POA: Diagnosis not present

## 2019-02-28 DIAGNOSIS — M79671 Pain in right foot: Secondary | ICD-10-CM | POA: Diagnosis not present

## 2019-03-03 DIAGNOSIS — R0602 Shortness of breath: Secondary | ICD-10-CM | POA: Diagnosis not present

## 2019-03-03 DIAGNOSIS — Z6833 Body mass index (BMI) 33.0-33.9, adult: Secondary | ICD-10-CM | POA: Diagnosis not present

## 2019-03-03 DIAGNOSIS — R7303 Prediabetes: Secondary | ICD-10-CM | POA: Diagnosis not present

## 2019-03-03 DIAGNOSIS — M48061 Spinal stenosis, lumbar region without neurogenic claudication: Secondary | ICD-10-CM | POA: Diagnosis not present

## 2019-03-03 DIAGNOSIS — I10 Essential (primary) hypertension: Secondary | ICD-10-CM | POA: Diagnosis not present

## 2019-03-03 DIAGNOSIS — E782 Mixed hyperlipidemia: Secondary | ICD-10-CM | POA: Diagnosis not present

## 2019-03-07 DIAGNOSIS — M48061 Spinal stenosis, lumbar region without neurogenic claudication: Secondary | ICD-10-CM | POA: Diagnosis not present

## 2019-03-07 DIAGNOSIS — G629 Polyneuropathy, unspecified: Secondary | ICD-10-CM | POA: Diagnosis not present

## 2019-03-07 DIAGNOSIS — M79671 Pain in right foot: Secondary | ICD-10-CM | POA: Diagnosis not present

## 2019-03-09 DIAGNOSIS — M48061 Spinal stenosis, lumbar region without neurogenic claudication: Secondary | ICD-10-CM | POA: Diagnosis not present

## 2019-03-09 DIAGNOSIS — M79671 Pain in right foot: Secondary | ICD-10-CM | POA: Diagnosis not present

## 2019-03-09 DIAGNOSIS — G629 Polyneuropathy, unspecified: Secondary | ICD-10-CM | POA: Diagnosis not present

## 2019-03-14 DIAGNOSIS — G629 Polyneuropathy, unspecified: Secondary | ICD-10-CM | POA: Diagnosis not present

## 2019-03-14 DIAGNOSIS — M48061 Spinal stenosis, lumbar region without neurogenic claudication: Secondary | ICD-10-CM | POA: Diagnosis not present

## 2019-03-14 DIAGNOSIS — M79671 Pain in right foot: Secondary | ICD-10-CM | POA: Diagnosis not present

## 2019-03-16 DIAGNOSIS — M48061 Spinal stenosis, lumbar region without neurogenic claudication: Secondary | ICD-10-CM | POA: Diagnosis not present

## 2019-03-16 DIAGNOSIS — G629 Polyneuropathy, unspecified: Secondary | ICD-10-CM | POA: Diagnosis not present

## 2019-03-16 DIAGNOSIS — M79671 Pain in right foot: Secondary | ICD-10-CM | POA: Diagnosis not present

## 2019-03-20 DIAGNOSIS — G629 Polyneuropathy, unspecified: Secondary | ICD-10-CM | POA: Diagnosis not present

## 2019-03-20 DIAGNOSIS — M48061 Spinal stenosis, lumbar region without neurogenic claudication: Secondary | ICD-10-CM | POA: Diagnosis not present

## 2019-03-20 DIAGNOSIS — M79671 Pain in right foot: Secondary | ICD-10-CM | POA: Diagnosis not present

## 2019-03-22 DIAGNOSIS — M79671 Pain in right foot: Secondary | ICD-10-CM | POA: Diagnosis not present

## 2019-03-22 DIAGNOSIS — G629 Polyneuropathy, unspecified: Secondary | ICD-10-CM | POA: Diagnosis not present

## 2019-03-22 DIAGNOSIS — M48061 Spinal stenosis, lumbar region without neurogenic claudication: Secondary | ICD-10-CM | POA: Diagnosis not present

## 2019-03-28 DIAGNOSIS — M48061 Spinal stenosis, lumbar region without neurogenic claudication: Secondary | ICD-10-CM | POA: Diagnosis not present

## 2019-03-28 DIAGNOSIS — M79671 Pain in right foot: Secondary | ICD-10-CM | POA: Diagnosis not present

## 2019-03-28 DIAGNOSIS — G629 Polyneuropathy, unspecified: Secondary | ICD-10-CM | POA: Diagnosis not present

## 2019-03-30 DIAGNOSIS — M79671 Pain in right foot: Secondary | ICD-10-CM | POA: Diagnosis not present

## 2019-03-30 DIAGNOSIS — G629 Polyneuropathy, unspecified: Secondary | ICD-10-CM | POA: Diagnosis not present

## 2019-03-30 DIAGNOSIS — M48061 Spinal stenosis, lumbar region without neurogenic claudication: Secondary | ICD-10-CM | POA: Diagnosis not present

## 2019-04-04 DIAGNOSIS — M48061 Spinal stenosis, lumbar region without neurogenic claudication: Secondary | ICD-10-CM | POA: Diagnosis not present

## 2019-04-04 DIAGNOSIS — G629 Polyneuropathy, unspecified: Secondary | ICD-10-CM | POA: Diagnosis not present

## 2019-04-04 DIAGNOSIS — M79671 Pain in right foot: Secondary | ICD-10-CM | POA: Diagnosis not present

## 2019-04-06 DIAGNOSIS — M79671 Pain in right foot: Secondary | ICD-10-CM | POA: Diagnosis not present

## 2019-04-06 DIAGNOSIS — M48061 Spinal stenosis, lumbar region without neurogenic claudication: Secondary | ICD-10-CM | POA: Diagnosis not present

## 2019-04-06 DIAGNOSIS — G629 Polyneuropathy, unspecified: Secondary | ICD-10-CM | POA: Diagnosis not present

## 2019-04-11 DIAGNOSIS — M79671 Pain in right foot: Secondary | ICD-10-CM | POA: Diagnosis not present

## 2019-04-11 DIAGNOSIS — M48061 Spinal stenosis, lumbar region without neurogenic claudication: Secondary | ICD-10-CM | POA: Diagnosis not present

## 2019-04-11 DIAGNOSIS — G629 Polyneuropathy, unspecified: Secondary | ICD-10-CM | POA: Diagnosis not present

## 2019-04-13 DIAGNOSIS — G629 Polyneuropathy, unspecified: Secondary | ICD-10-CM | POA: Diagnosis not present

## 2019-04-13 DIAGNOSIS — M79671 Pain in right foot: Secondary | ICD-10-CM | POA: Diagnosis not present

## 2019-04-13 DIAGNOSIS — M48061 Spinal stenosis, lumbar region without neurogenic claudication: Secondary | ICD-10-CM | POA: Diagnosis not present

## 2019-04-13 DIAGNOSIS — Z6833 Body mass index (BMI) 33.0-33.9, adult: Secondary | ICD-10-CM | POA: Diagnosis not present

## 2019-04-13 DIAGNOSIS — M25552 Pain in left hip: Secondary | ICD-10-CM | POA: Diagnosis not present

## 2019-04-14 ENCOUNTER — Other Ambulatory Visit: Payer: Self-pay

## 2019-04-14 ENCOUNTER — Other Ambulatory Visit: Payer: Self-pay | Admitting: Physician Assistant

## 2019-04-14 ENCOUNTER — Ambulatory Visit
Admission: RE | Admit: 2019-04-14 | Discharge: 2019-04-14 | Disposition: A | Discharge status: Home / Self Care | Payer: Medicare HMO | Source: Ambulatory Visit | Attending: Physician Assistant | Admitting: Physician Assistant

## 2019-04-14 DIAGNOSIS — M25552 Pain in left hip: Secondary | ICD-10-CM

## 2019-04-18 DIAGNOSIS — G629 Polyneuropathy, unspecified: Secondary | ICD-10-CM | POA: Diagnosis not present

## 2019-04-18 DIAGNOSIS — M48061 Spinal stenosis, lumbar region without neurogenic claudication: Secondary | ICD-10-CM | POA: Diagnosis not present

## 2019-04-18 DIAGNOSIS — M1612 Unilateral primary osteoarthritis, left hip: Secondary | ICD-10-CM | POA: Diagnosis not present

## 2019-04-18 DIAGNOSIS — M79671 Pain in right foot: Secondary | ICD-10-CM | POA: Diagnosis not present

## 2019-04-20 DIAGNOSIS — M48061 Spinal stenosis, lumbar region without neurogenic claudication: Secondary | ICD-10-CM | POA: Diagnosis not present

## 2019-04-20 DIAGNOSIS — M1612 Unilateral primary osteoarthritis, left hip: Secondary | ICD-10-CM | POA: Diagnosis not present

## 2019-04-20 DIAGNOSIS — G629 Polyneuropathy, unspecified: Secondary | ICD-10-CM | POA: Diagnosis not present

## 2019-04-20 DIAGNOSIS — M79671 Pain in right foot: Secondary | ICD-10-CM | POA: Diagnosis not present

## 2019-04-21 DIAGNOSIS — H353131 Nonexudative age-related macular degeneration, bilateral, early dry stage: Secondary | ICD-10-CM | POA: Diagnosis not present

## 2019-04-21 DIAGNOSIS — D3131 Benign neoplasm of right choroid: Secondary | ICD-10-CM | POA: Diagnosis not present

## 2019-04-21 DIAGNOSIS — D3132 Benign neoplasm of left choroid: Secondary | ICD-10-CM | POA: Diagnosis not present

## 2019-04-21 DIAGNOSIS — Z961 Presence of intraocular lens: Secondary | ICD-10-CM | POA: Diagnosis not present

## 2019-04-25 DIAGNOSIS — M1612 Unilateral primary osteoarthritis, left hip: Secondary | ICD-10-CM | POA: Diagnosis not present

## 2019-04-25 DIAGNOSIS — M79671 Pain in right foot: Secondary | ICD-10-CM | POA: Diagnosis not present

## 2019-04-25 DIAGNOSIS — G629 Polyneuropathy, unspecified: Secondary | ICD-10-CM | POA: Diagnosis not present

## 2019-04-25 DIAGNOSIS — M48061 Spinal stenosis, lumbar region without neurogenic claudication: Secondary | ICD-10-CM | POA: Diagnosis not present

## 2019-04-27 DIAGNOSIS — G629 Polyneuropathy, unspecified: Secondary | ICD-10-CM | POA: Diagnosis not present

## 2019-04-27 DIAGNOSIS — M79671 Pain in right foot: Secondary | ICD-10-CM | POA: Diagnosis not present

## 2019-04-27 DIAGNOSIS — M1612 Unilateral primary osteoarthritis, left hip: Secondary | ICD-10-CM | POA: Diagnosis not present

## 2019-04-27 DIAGNOSIS — M48061 Spinal stenosis, lumbar region without neurogenic claudication: Secondary | ICD-10-CM | POA: Diagnosis not present

## 2019-05-02 DIAGNOSIS — M79671 Pain in right foot: Secondary | ICD-10-CM | POA: Diagnosis not present

## 2019-05-02 DIAGNOSIS — M1612 Unilateral primary osteoarthritis, left hip: Secondary | ICD-10-CM | POA: Diagnosis not present

## 2019-05-02 DIAGNOSIS — M48061 Spinal stenosis, lumbar region without neurogenic claudication: Secondary | ICD-10-CM | POA: Diagnosis not present

## 2019-05-02 DIAGNOSIS — G629 Polyneuropathy, unspecified: Secondary | ICD-10-CM | POA: Diagnosis not present

## 2019-05-04 DIAGNOSIS — M79671 Pain in right foot: Secondary | ICD-10-CM | POA: Diagnosis not present

## 2019-05-04 DIAGNOSIS — M1612 Unilateral primary osteoarthritis, left hip: Secondary | ICD-10-CM | POA: Diagnosis not present

## 2019-05-04 DIAGNOSIS — M48061 Spinal stenosis, lumbar region without neurogenic claudication: Secondary | ICD-10-CM | POA: Diagnosis not present

## 2019-05-04 DIAGNOSIS — G629 Polyneuropathy, unspecified: Secondary | ICD-10-CM | POA: Diagnosis not present

## 2019-05-09 DIAGNOSIS — M48061 Spinal stenosis, lumbar region without neurogenic claudication: Secondary | ICD-10-CM | POA: Diagnosis not present

## 2019-05-09 DIAGNOSIS — M1612 Unilateral primary osteoarthritis, left hip: Secondary | ICD-10-CM | POA: Diagnosis not present

## 2019-05-09 DIAGNOSIS — G629 Polyneuropathy, unspecified: Secondary | ICD-10-CM | POA: Diagnosis not present

## 2019-05-09 DIAGNOSIS — M79671 Pain in right foot: Secondary | ICD-10-CM | POA: Diagnosis not present

## 2019-05-11 DIAGNOSIS — Z96653 Presence of artificial knee joint, bilateral: Secondary | ICD-10-CM | POA: Diagnosis not present

## 2019-05-11 DIAGNOSIS — M79671 Pain in right foot: Secondary | ICD-10-CM | POA: Diagnosis not present

## 2019-05-11 DIAGNOSIS — Z8262 Family history of osteoporosis: Secondary | ICD-10-CM | POA: Diagnosis not present

## 2019-05-11 DIAGNOSIS — M48061 Spinal stenosis, lumbar region without neurogenic claudication: Secondary | ICD-10-CM | POA: Diagnosis not present

## 2019-05-11 DIAGNOSIS — G629 Polyneuropathy, unspecified: Secondary | ICD-10-CM | POA: Diagnosis not present

## 2019-05-11 DIAGNOSIS — M8589 Other specified disorders of bone density and structure, multiple sites: Secondary | ICD-10-CM | POA: Diagnosis not present

## 2019-05-11 DIAGNOSIS — M1612 Unilateral primary osteoarthritis, left hip: Secondary | ICD-10-CM | POA: Diagnosis not present

## 2019-05-16 DIAGNOSIS — M48061 Spinal stenosis, lumbar region without neurogenic claudication: Secondary | ICD-10-CM | POA: Diagnosis not present

## 2019-05-16 DIAGNOSIS — M79671 Pain in right foot: Secondary | ICD-10-CM | POA: Diagnosis not present

## 2019-05-16 DIAGNOSIS — M1612 Unilateral primary osteoarthritis, left hip: Secondary | ICD-10-CM | POA: Diagnosis not present

## 2019-05-16 DIAGNOSIS — G629 Polyneuropathy, unspecified: Secondary | ICD-10-CM | POA: Diagnosis not present

## 2019-05-18 DIAGNOSIS — G629 Polyneuropathy, unspecified: Secondary | ICD-10-CM | POA: Diagnosis not present

## 2019-05-18 DIAGNOSIS — M48061 Spinal stenosis, lumbar region without neurogenic claudication: Secondary | ICD-10-CM | POA: Diagnosis not present

## 2019-05-18 DIAGNOSIS — M79671 Pain in right foot: Secondary | ICD-10-CM | POA: Diagnosis not present

## 2019-05-18 DIAGNOSIS — M1612 Unilateral primary osteoarthritis, left hip: Secondary | ICD-10-CM | POA: Diagnosis not present

## 2019-05-30 DIAGNOSIS — M48061 Spinal stenosis, lumbar region without neurogenic claudication: Secondary | ICD-10-CM | POA: Diagnosis not present

## 2019-05-30 DIAGNOSIS — M1612 Unilateral primary osteoarthritis, left hip: Secondary | ICD-10-CM | POA: Diagnosis not present

## 2019-05-30 DIAGNOSIS — G629 Polyneuropathy, unspecified: Secondary | ICD-10-CM | POA: Diagnosis not present

## 2019-05-30 DIAGNOSIS — M79671 Pain in right foot: Secondary | ICD-10-CM | POA: Diagnosis not present

## 2019-06-01 DIAGNOSIS — M1612 Unilateral primary osteoarthritis, left hip: Secondary | ICD-10-CM | POA: Diagnosis not present

## 2019-06-01 DIAGNOSIS — M48061 Spinal stenosis, lumbar region without neurogenic claudication: Secondary | ICD-10-CM | POA: Diagnosis not present

## 2019-06-01 DIAGNOSIS — M79671 Pain in right foot: Secondary | ICD-10-CM | POA: Diagnosis not present

## 2019-06-01 DIAGNOSIS — G629 Polyneuropathy, unspecified: Secondary | ICD-10-CM | POA: Diagnosis not present

## 2019-06-06 DIAGNOSIS — M1612 Unilateral primary osteoarthritis, left hip: Secondary | ICD-10-CM | POA: Diagnosis not present

## 2019-06-06 DIAGNOSIS — M48061 Spinal stenosis, lumbar region without neurogenic claudication: Secondary | ICD-10-CM | POA: Diagnosis not present

## 2019-06-06 DIAGNOSIS — G629 Polyneuropathy, unspecified: Secondary | ICD-10-CM | POA: Diagnosis not present

## 2019-06-06 DIAGNOSIS — M79671 Pain in right foot: Secondary | ICD-10-CM | POA: Diagnosis not present

## 2019-06-07 DIAGNOSIS — Z79899 Other long term (current) drug therapy: Secondary | ICD-10-CM | POA: Diagnosis not present

## 2019-06-07 DIAGNOSIS — E782 Mixed hyperlipidemia: Secondary | ICD-10-CM | POA: Diagnosis not present

## 2019-06-07 DIAGNOSIS — N183 Chronic kidney disease, stage 3 (moderate): Secondary | ICD-10-CM | POA: Diagnosis not present

## 2019-06-08 DIAGNOSIS — M79671 Pain in right foot: Secondary | ICD-10-CM | POA: Diagnosis not present

## 2019-06-08 DIAGNOSIS — M1612 Unilateral primary osteoarthritis, left hip: Secondary | ICD-10-CM | POA: Diagnosis not present

## 2019-06-08 DIAGNOSIS — M48061 Spinal stenosis, lumbar region without neurogenic claudication: Secondary | ICD-10-CM | POA: Diagnosis not present

## 2019-06-08 DIAGNOSIS — G629 Polyneuropathy, unspecified: Secondary | ICD-10-CM | POA: Diagnosis not present

## 2019-06-14 DIAGNOSIS — I10 Essential (primary) hypertension: Secondary | ICD-10-CM | POA: Diagnosis not present

## 2019-06-14 DIAGNOSIS — M48061 Spinal stenosis, lumbar region without neurogenic claudication: Secondary | ICD-10-CM | POA: Diagnosis not present

## 2019-06-14 DIAGNOSIS — Z139 Encounter for screening, unspecified: Secondary | ICD-10-CM | POA: Diagnosis not present

## 2019-06-14 DIAGNOSIS — Z9181 History of falling: Secondary | ICD-10-CM | POA: Diagnosis not present

## 2019-06-14 DIAGNOSIS — R7303 Prediabetes: Secondary | ICD-10-CM | POA: Diagnosis not present

## 2019-06-14 DIAGNOSIS — E782 Mixed hyperlipidemia: Secondary | ICD-10-CM | POA: Diagnosis not present

## 2019-06-14 DIAGNOSIS — R0602 Shortness of breath: Secondary | ICD-10-CM | POA: Diagnosis not present

## 2019-06-14 DIAGNOSIS — Z1331 Encounter for screening for depression: Secondary | ICD-10-CM | POA: Diagnosis not present

## 2019-06-14 DIAGNOSIS — N183 Chronic kidney disease, stage 3 (moderate): Secondary | ICD-10-CM | POA: Diagnosis not present

## 2019-06-15 DIAGNOSIS — M79671 Pain in right foot: Secondary | ICD-10-CM | POA: Diagnosis not present

## 2019-06-15 DIAGNOSIS — G629 Polyneuropathy, unspecified: Secondary | ICD-10-CM | POA: Diagnosis not present

## 2019-06-15 DIAGNOSIS — M1612 Unilateral primary osteoarthritis, left hip: Secondary | ICD-10-CM | POA: Diagnosis not present

## 2019-06-15 DIAGNOSIS — M48061 Spinal stenosis, lumbar region without neurogenic claudication: Secondary | ICD-10-CM | POA: Diagnosis not present

## 2019-06-20 DIAGNOSIS — M1612 Unilateral primary osteoarthritis, left hip: Secondary | ICD-10-CM | POA: Diagnosis not present

## 2019-06-20 DIAGNOSIS — M48061 Spinal stenosis, lumbar region without neurogenic claudication: Secondary | ICD-10-CM | POA: Diagnosis not present

## 2019-06-20 DIAGNOSIS — G629 Polyneuropathy, unspecified: Secondary | ICD-10-CM | POA: Diagnosis not present

## 2019-06-20 DIAGNOSIS — M79671 Pain in right foot: Secondary | ICD-10-CM | POA: Diagnosis not present

## 2019-06-22 DIAGNOSIS — M79671 Pain in right foot: Secondary | ICD-10-CM | POA: Diagnosis not present

## 2019-06-22 DIAGNOSIS — G629 Polyneuropathy, unspecified: Secondary | ICD-10-CM | POA: Diagnosis not present

## 2019-06-22 DIAGNOSIS — M1612 Unilateral primary osteoarthritis, left hip: Secondary | ICD-10-CM | POA: Diagnosis not present

## 2019-06-22 DIAGNOSIS — M48061 Spinal stenosis, lumbar region without neurogenic claudication: Secondary | ICD-10-CM | POA: Diagnosis not present

## 2019-06-26 DIAGNOSIS — M1612 Unilateral primary osteoarthritis, left hip: Secondary | ICD-10-CM | POA: Diagnosis not present

## 2019-06-26 DIAGNOSIS — M48061 Spinal stenosis, lumbar region without neurogenic claudication: Secondary | ICD-10-CM | POA: Diagnosis not present

## 2019-06-26 DIAGNOSIS — M79671 Pain in right foot: Secondary | ICD-10-CM | POA: Diagnosis not present

## 2019-06-26 DIAGNOSIS — G629 Polyneuropathy, unspecified: Secondary | ICD-10-CM | POA: Diagnosis not present

## 2019-06-30 DIAGNOSIS — M79671 Pain in right foot: Secondary | ICD-10-CM | POA: Diagnosis not present

## 2019-06-30 DIAGNOSIS — M1612 Unilateral primary osteoarthritis, left hip: Secondary | ICD-10-CM | POA: Diagnosis not present

## 2019-06-30 DIAGNOSIS — G629 Polyneuropathy, unspecified: Secondary | ICD-10-CM | POA: Diagnosis not present

## 2019-06-30 DIAGNOSIS — M48061 Spinal stenosis, lumbar region without neurogenic claudication: Secondary | ICD-10-CM | POA: Diagnosis not present

## 2019-07-04 DIAGNOSIS — G629 Polyneuropathy, unspecified: Secondary | ICD-10-CM | POA: Diagnosis not present

## 2019-07-04 DIAGNOSIS — M1612 Unilateral primary osteoarthritis, left hip: Secondary | ICD-10-CM | POA: Diagnosis not present

## 2019-07-04 DIAGNOSIS — M48061 Spinal stenosis, lumbar region without neurogenic claudication: Secondary | ICD-10-CM | POA: Diagnosis not present

## 2019-07-04 DIAGNOSIS — M79671 Pain in right foot: Secondary | ICD-10-CM | POA: Diagnosis not present

## 2019-07-06 DIAGNOSIS — M79671 Pain in right foot: Secondary | ICD-10-CM | POA: Diagnosis not present

## 2019-07-06 DIAGNOSIS — M1612 Unilateral primary osteoarthritis, left hip: Secondary | ICD-10-CM | POA: Diagnosis not present

## 2019-07-06 DIAGNOSIS — M48061 Spinal stenosis, lumbar region without neurogenic claudication: Secondary | ICD-10-CM | POA: Diagnosis not present

## 2019-07-06 DIAGNOSIS — G629 Polyneuropathy, unspecified: Secondary | ICD-10-CM | POA: Diagnosis not present

## 2019-07-11 DIAGNOSIS — M48061 Spinal stenosis, lumbar region without neurogenic claudication: Secondary | ICD-10-CM | POA: Diagnosis not present

## 2019-07-11 DIAGNOSIS — G629 Polyneuropathy, unspecified: Secondary | ICD-10-CM | POA: Diagnosis not present

## 2019-07-11 DIAGNOSIS — M79671 Pain in right foot: Secondary | ICD-10-CM | POA: Diagnosis not present

## 2019-07-11 DIAGNOSIS — M1612 Unilateral primary osteoarthritis, left hip: Secondary | ICD-10-CM | POA: Diagnosis not present

## 2019-07-13 DIAGNOSIS — G629 Polyneuropathy, unspecified: Secondary | ICD-10-CM | POA: Diagnosis not present

## 2019-07-13 DIAGNOSIS — M1612 Unilateral primary osteoarthritis, left hip: Secondary | ICD-10-CM | POA: Diagnosis not present

## 2019-07-13 DIAGNOSIS — M79671 Pain in right foot: Secondary | ICD-10-CM | POA: Diagnosis not present

## 2019-07-13 DIAGNOSIS — M48061 Spinal stenosis, lumbar region without neurogenic claudication: Secondary | ICD-10-CM | POA: Diagnosis not present

## 2019-07-18 DIAGNOSIS — M48061 Spinal stenosis, lumbar region without neurogenic claudication: Secondary | ICD-10-CM | POA: Diagnosis not present

## 2019-07-18 DIAGNOSIS — M1612 Unilateral primary osteoarthritis, left hip: Secondary | ICD-10-CM | POA: Diagnosis not present

## 2019-07-18 DIAGNOSIS — M79671 Pain in right foot: Secondary | ICD-10-CM | POA: Diagnosis not present

## 2019-07-18 DIAGNOSIS — G629 Polyneuropathy, unspecified: Secondary | ICD-10-CM | POA: Diagnosis not present

## 2019-07-20 DIAGNOSIS — M1612 Unilateral primary osteoarthritis, left hip: Secondary | ICD-10-CM | POA: Diagnosis not present

## 2019-07-20 DIAGNOSIS — M79671 Pain in right foot: Secondary | ICD-10-CM | POA: Diagnosis not present

## 2019-07-20 DIAGNOSIS — G629 Polyneuropathy, unspecified: Secondary | ICD-10-CM | POA: Diagnosis not present

## 2019-07-20 DIAGNOSIS — M48061 Spinal stenosis, lumbar region without neurogenic claudication: Secondary | ICD-10-CM | POA: Diagnosis not present

## 2019-07-25 DIAGNOSIS — M1612 Unilateral primary osteoarthritis, left hip: Secondary | ICD-10-CM | POA: Diagnosis not present

## 2019-07-25 DIAGNOSIS — G629 Polyneuropathy, unspecified: Secondary | ICD-10-CM | POA: Diagnosis not present

## 2019-07-25 DIAGNOSIS — M48061 Spinal stenosis, lumbar region without neurogenic claudication: Secondary | ICD-10-CM | POA: Diagnosis not present

## 2019-07-25 DIAGNOSIS — M79671 Pain in right foot: Secondary | ICD-10-CM | POA: Diagnosis not present

## 2019-07-27 DIAGNOSIS — M79671 Pain in right foot: Secondary | ICD-10-CM | POA: Diagnosis not present

## 2019-07-27 DIAGNOSIS — M1612 Unilateral primary osteoarthritis, left hip: Secondary | ICD-10-CM | POA: Diagnosis not present

## 2019-07-27 DIAGNOSIS — G629 Polyneuropathy, unspecified: Secondary | ICD-10-CM | POA: Diagnosis not present

## 2019-07-27 DIAGNOSIS — M48061 Spinal stenosis, lumbar region without neurogenic claudication: Secondary | ICD-10-CM | POA: Diagnosis not present

## 2019-08-01 DIAGNOSIS — M79671 Pain in right foot: Secondary | ICD-10-CM | POA: Diagnosis not present

## 2019-08-01 DIAGNOSIS — M1612 Unilateral primary osteoarthritis, left hip: Secondary | ICD-10-CM | POA: Diagnosis not present

## 2019-08-01 DIAGNOSIS — G629 Polyneuropathy, unspecified: Secondary | ICD-10-CM | POA: Diagnosis not present

## 2019-08-01 DIAGNOSIS — M48061 Spinal stenosis, lumbar region without neurogenic claudication: Secondary | ICD-10-CM | POA: Diagnosis not present

## 2019-08-11 DIAGNOSIS — M48061 Spinal stenosis, lumbar region without neurogenic claudication: Secondary | ICD-10-CM | POA: Diagnosis not present

## 2019-08-11 DIAGNOSIS — G629 Polyneuropathy, unspecified: Secondary | ICD-10-CM | POA: Diagnosis not present

## 2019-08-11 DIAGNOSIS — M1612 Unilateral primary osteoarthritis, left hip: Secondary | ICD-10-CM | POA: Diagnosis not present

## 2019-08-11 DIAGNOSIS — M79671 Pain in right foot: Secondary | ICD-10-CM | POA: Diagnosis not present

## 2019-08-15 DIAGNOSIS — M1612 Unilateral primary osteoarthritis, left hip: Secondary | ICD-10-CM | POA: Diagnosis not present

## 2019-08-15 DIAGNOSIS — G629 Polyneuropathy, unspecified: Secondary | ICD-10-CM | POA: Diagnosis not present

## 2019-08-15 DIAGNOSIS — M48061 Spinal stenosis, lumbar region without neurogenic claudication: Secondary | ICD-10-CM | POA: Diagnosis not present

## 2019-08-15 DIAGNOSIS — M79671 Pain in right foot: Secondary | ICD-10-CM | POA: Diagnosis not present

## 2019-08-24 DIAGNOSIS — M48061 Spinal stenosis, lumbar region without neurogenic claudication: Secondary | ICD-10-CM | POA: Diagnosis not present

## 2019-08-24 DIAGNOSIS — M1612 Unilateral primary osteoarthritis, left hip: Secondary | ICD-10-CM | POA: Diagnosis not present

## 2019-08-24 DIAGNOSIS — G629 Polyneuropathy, unspecified: Secondary | ICD-10-CM | POA: Diagnosis not present

## 2019-08-24 DIAGNOSIS — M79671 Pain in right foot: Secondary | ICD-10-CM | POA: Diagnosis not present

## 2019-08-29 DIAGNOSIS — M48061 Spinal stenosis, lumbar region without neurogenic claudication: Secondary | ICD-10-CM | POA: Diagnosis not present

## 2019-08-29 DIAGNOSIS — G629 Polyneuropathy, unspecified: Secondary | ICD-10-CM | POA: Diagnosis not present

## 2019-08-29 DIAGNOSIS — M79671 Pain in right foot: Secondary | ICD-10-CM | POA: Diagnosis not present

## 2019-08-29 DIAGNOSIS — H04123 Dry eye syndrome of bilateral lacrimal glands: Secondary | ICD-10-CM | POA: Diagnosis not present

## 2019-08-29 DIAGNOSIS — D3132 Benign neoplasm of left choroid: Secondary | ICD-10-CM | POA: Diagnosis not present

## 2019-08-29 DIAGNOSIS — Z961 Presence of intraocular lens: Secondary | ICD-10-CM | POA: Diagnosis not present

## 2019-08-29 DIAGNOSIS — D3131 Benign neoplasm of right choroid: Secondary | ICD-10-CM | POA: Diagnosis not present

## 2019-08-29 DIAGNOSIS — M1612 Unilateral primary osteoarthritis, left hip: Secondary | ICD-10-CM | POA: Diagnosis not present

## 2019-08-29 DIAGNOSIS — H353131 Nonexudative age-related macular degeneration, bilateral, early dry stage: Secondary | ICD-10-CM | POA: Diagnosis not present

## 2019-08-31 DIAGNOSIS — M79671 Pain in right foot: Secondary | ICD-10-CM | POA: Diagnosis not present

## 2019-08-31 DIAGNOSIS — M48061 Spinal stenosis, lumbar region without neurogenic claudication: Secondary | ICD-10-CM | POA: Diagnosis not present

## 2019-08-31 DIAGNOSIS — M1612 Unilateral primary osteoarthritis, left hip: Secondary | ICD-10-CM | POA: Diagnosis not present

## 2019-08-31 DIAGNOSIS — G629 Polyneuropathy, unspecified: Secondary | ICD-10-CM | POA: Diagnosis not present

## 2019-09-05 DIAGNOSIS — M48061 Spinal stenosis, lumbar region without neurogenic claudication: Secondary | ICD-10-CM | POA: Diagnosis not present

## 2019-09-05 DIAGNOSIS — Z23 Encounter for immunization: Secondary | ICD-10-CM | POA: Diagnosis not present

## 2019-09-05 DIAGNOSIS — I10 Essential (primary) hypertension: Secondary | ICD-10-CM | POA: Diagnosis not present

## 2019-09-05 DIAGNOSIS — H53129 Transient visual loss, unspecified eye: Secondary | ICD-10-CM | POA: Diagnosis not present

## 2019-09-05 DIAGNOSIS — R0602 Shortness of breath: Secondary | ICD-10-CM | POA: Diagnosis not present

## 2019-09-05 DIAGNOSIS — Z6833 Body mass index (BMI) 33.0-33.9, adult: Secondary | ICD-10-CM | POA: Diagnosis not present

## 2019-09-05 DIAGNOSIS — M79671 Pain in right foot: Secondary | ICD-10-CM | POA: Diagnosis not present

## 2019-09-05 DIAGNOSIS — M1612 Unilateral primary osteoarthritis, left hip: Secondary | ICD-10-CM | POA: Diagnosis not present

## 2019-09-05 DIAGNOSIS — G629 Polyneuropathy, unspecified: Secondary | ICD-10-CM | POA: Diagnosis not present

## 2019-09-07 ENCOUNTER — Other Ambulatory Visit: Payer: Self-pay

## 2019-09-07 DIAGNOSIS — M48061 Spinal stenosis, lumbar region without neurogenic claudication: Secondary | ICD-10-CM | POA: Diagnosis not present

## 2019-09-07 DIAGNOSIS — G629 Polyneuropathy, unspecified: Secondary | ICD-10-CM | POA: Diagnosis not present

## 2019-09-07 DIAGNOSIS — M79671 Pain in right foot: Secondary | ICD-10-CM | POA: Diagnosis not present

## 2019-09-07 DIAGNOSIS — M1612 Unilateral primary osteoarthritis, left hip: Secondary | ICD-10-CM | POA: Diagnosis not present

## 2019-09-11 ENCOUNTER — Other Ambulatory Visit (HOSPITAL_COMMUNITY): Payer: Self-pay | Admitting: Physician Assistant

## 2019-09-11 ENCOUNTER — Telehealth (HOSPITAL_COMMUNITY): Payer: Self-pay

## 2019-09-11 DIAGNOSIS — H53129 Transient visual loss, unspecified eye: Secondary | ICD-10-CM

## 2019-09-11 NOTE — Telephone Encounter (Signed)

## 2019-09-12 ENCOUNTER — Ambulatory Visit (HOSPITAL_COMMUNITY)
Admission: RE | Admit: 2019-09-12 | Discharge: 2019-09-12 | Disposition: A | Discharge status: Home / Self Care | Payer: Medicare HMO | Source: Ambulatory Visit | Attending: Family | Admitting: Family

## 2019-09-12 ENCOUNTER — Other Ambulatory Visit: Payer: Self-pay

## 2019-09-12 DIAGNOSIS — M1612 Unilateral primary osteoarthritis, left hip: Secondary | ICD-10-CM | POA: Diagnosis not present

## 2019-09-12 DIAGNOSIS — H53129 Transient visual loss, unspecified eye: Secondary | ICD-10-CM | POA: Diagnosis not present

## 2019-09-12 DIAGNOSIS — M48061 Spinal stenosis, lumbar region without neurogenic claudication: Secondary | ICD-10-CM | POA: Diagnosis not present

## 2019-09-12 DIAGNOSIS — G629 Polyneuropathy, unspecified: Secondary | ICD-10-CM | POA: Diagnosis not present

## 2019-09-12 DIAGNOSIS — M79671 Pain in right foot: Secondary | ICD-10-CM | POA: Diagnosis not present

## 2019-09-14 DIAGNOSIS — M48061 Spinal stenosis, lumbar region without neurogenic claudication: Secondary | ICD-10-CM | POA: Diagnosis not present

## 2019-09-14 DIAGNOSIS — M1612 Unilateral primary osteoarthritis, left hip: Secondary | ICD-10-CM | POA: Diagnosis not present

## 2019-09-14 DIAGNOSIS — G629 Polyneuropathy, unspecified: Secondary | ICD-10-CM | POA: Diagnosis not present

## 2019-09-14 DIAGNOSIS — M79671 Pain in right foot: Secondary | ICD-10-CM | POA: Diagnosis not present

## 2019-09-19 DIAGNOSIS — D3131 Benign neoplasm of right choroid: Secondary | ICD-10-CM | POA: Diagnosis not present

## 2019-09-19 DIAGNOSIS — H3402 Transient retinal artery occlusion, left eye: Secondary | ICD-10-CM | POA: Diagnosis not present

## 2019-09-19 DIAGNOSIS — D3132 Benign neoplasm of left choroid: Secondary | ICD-10-CM | POA: Diagnosis not present

## 2019-09-19 DIAGNOSIS — Z961 Presence of intraocular lens: Secondary | ICD-10-CM | POA: Diagnosis not present

## 2019-09-21 DIAGNOSIS — M1612 Unilateral primary osteoarthritis, left hip: Secondary | ICD-10-CM | POA: Diagnosis not present

## 2019-09-21 DIAGNOSIS — G629 Polyneuropathy, unspecified: Secondary | ICD-10-CM | POA: Diagnosis not present

## 2019-09-21 DIAGNOSIS — M79671 Pain in right foot: Secondary | ICD-10-CM | POA: Diagnosis not present

## 2019-09-21 DIAGNOSIS — M48061 Spinal stenosis, lumbar region without neurogenic claudication: Secondary | ICD-10-CM | POA: Diagnosis not present

## 2019-09-25 DIAGNOSIS — M48061 Spinal stenosis, lumbar region without neurogenic claudication: Secondary | ICD-10-CM | POA: Diagnosis not present

## 2019-09-25 DIAGNOSIS — I10 Essential (primary) hypertension: Secondary | ICD-10-CM | POA: Diagnosis not present

## 2019-09-25 DIAGNOSIS — R519 Headache, unspecified: Secondary | ICD-10-CM | POA: Diagnosis not present

## 2019-09-25 DIAGNOSIS — R42 Dizziness and giddiness: Secondary | ICD-10-CM | POA: Diagnosis not present

## 2019-09-25 DIAGNOSIS — Z6833 Body mass index (BMI) 33.0-33.9, adult: Secondary | ICD-10-CM | POA: Diagnosis not present

## 2019-09-26 DIAGNOSIS — G629 Polyneuropathy, unspecified: Secondary | ICD-10-CM | POA: Diagnosis not present

## 2019-09-26 DIAGNOSIS — M48061 Spinal stenosis, lumbar region without neurogenic claudication: Secondary | ICD-10-CM | POA: Diagnosis not present

## 2019-09-26 DIAGNOSIS — M1612 Unilateral primary osteoarthritis, left hip: Secondary | ICD-10-CM | POA: Diagnosis not present

## 2019-09-26 DIAGNOSIS — M79671 Pain in right foot: Secondary | ICD-10-CM | POA: Diagnosis not present

## 2019-09-28 DIAGNOSIS — M79671 Pain in right foot: Secondary | ICD-10-CM | POA: Diagnosis not present

## 2019-09-28 DIAGNOSIS — G629 Polyneuropathy, unspecified: Secondary | ICD-10-CM | POA: Diagnosis not present

## 2019-09-28 DIAGNOSIS — M1612 Unilateral primary osteoarthritis, left hip: Secondary | ICD-10-CM | POA: Diagnosis not present

## 2019-09-28 DIAGNOSIS — M48061 Spinal stenosis, lumbar region without neurogenic claudication: Secondary | ICD-10-CM | POA: Diagnosis not present

## 2019-10-03 DIAGNOSIS — M1612 Unilateral primary osteoarthritis, left hip: Secondary | ICD-10-CM | POA: Diagnosis not present

## 2019-10-03 DIAGNOSIS — M48061 Spinal stenosis, lumbar region without neurogenic claudication: Secondary | ICD-10-CM | POA: Diagnosis not present

## 2019-10-03 DIAGNOSIS — G629 Polyneuropathy, unspecified: Secondary | ICD-10-CM | POA: Diagnosis not present

## 2019-10-03 DIAGNOSIS — M79671 Pain in right foot: Secondary | ICD-10-CM | POA: Diagnosis not present

## 2019-10-05 DIAGNOSIS — M48061 Spinal stenosis, lumbar region without neurogenic claudication: Secondary | ICD-10-CM | POA: Diagnosis not present

## 2019-10-05 DIAGNOSIS — M79671 Pain in right foot: Secondary | ICD-10-CM | POA: Diagnosis not present

## 2019-10-05 DIAGNOSIS — G629 Polyneuropathy, unspecified: Secondary | ICD-10-CM | POA: Diagnosis not present

## 2019-10-05 DIAGNOSIS — M1612 Unilateral primary osteoarthritis, left hip: Secondary | ICD-10-CM | POA: Diagnosis not present

## 2019-10-10 DIAGNOSIS — M48061 Spinal stenosis, lumbar region without neurogenic claudication: Secondary | ICD-10-CM | POA: Diagnosis not present

## 2019-10-10 DIAGNOSIS — M79671 Pain in right foot: Secondary | ICD-10-CM | POA: Diagnosis not present

## 2019-10-10 DIAGNOSIS — G629 Polyneuropathy, unspecified: Secondary | ICD-10-CM | POA: Diagnosis not present

## 2019-10-10 DIAGNOSIS — M1612 Unilateral primary osteoarthritis, left hip: Secondary | ICD-10-CM | POA: Diagnosis not present

## 2019-10-13 DIAGNOSIS — M1612 Unilateral primary osteoarthritis, left hip: Secondary | ICD-10-CM | POA: Diagnosis not present

## 2019-10-13 DIAGNOSIS — M79671 Pain in right foot: Secondary | ICD-10-CM | POA: Diagnosis not present

## 2019-10-13 DIAGNOSIS — G629 Polyneuropathy, unspecified: Secondary | ICD-10-CM | POA: Diagnosis not present

## 2019-10-13 DIAGNOSIS — M48061 Spinal stenosis, lumbar region without neurogenic claudication: Secondary | ICD-10-CM | POA: Diagnosis not present

## 2019-10-17 DIAGNOSIS — M79671 Pain in right foot: Secondary | ICD-10-CM | POA: Diagnosis not present

## 2019-10-17 DIAGNOSIS — M48061 Spinal stenosis, lumbar region without neurogenic claudication: Secondary | ICD-10-CM | POA: Diagnosis not present

## 2019-10-17 DIAGNOSIS — G629 Polyneuropathy, unspecified: Secondary | ICD-10-CM | POA: Diagnosis not present

## 2019-10-17 DIAGNOSIS — M1612 Unilateral primary osteoarthritis, left hip: Secondary | ICD-10-CM | POA: Diagnosis not present

## 2019-10-19 DIAGNOSIS — M1612 Unilateral primary osteoarthritis, left hip: Secondary | ICD-10-CM | POA: Diagnosis not present

## 2019-10-19 DIAGNOSIS — M79671 Pain in right foot: Secondary | ICD-10-CM | POA: Diagnosis not present

## 2019-10-19 DIAGNOSIS — M48061 Spinal stenosis, lumbar region without neurogenic claudication: Secondary | ICD-10-CM | POA: Diagnosis not present

## 2019-10-19 DIAGNOSIS — G629 Polyneuropathy, unspecified: Secondary | ICD-10-CM | POA: Diagnosis not present

## 2019-10-20 DIAGNOSIS — Z1231 Encounter for screening mammogram for malignant neoplasm of breast: Secondary | ICD-10-CM | POA: Diagnosis not present

## 2019-10-20 DIAGNOSIS — Z803 Family history of malignant neoplasm of breast: Secondary | ICD-10-CM | POA: Diagnosis not present

## 2019-10-24 DIAGNOSIS — M48061 Spinal stenosis, lumbar region without neurogenic claudication: Secondary | ICD-10-CM | POA: Diagnosis not present

## 2019-10-24 DIAGNOSIS — M1612 Unilateral primary osteoarthritis, left hip: Secondary | ICD-10-CM | POA: Diagnosis not present

## 2019-10-24 DIAGNOSIS — M79671 Pain in right foot: Secondary | ICD-10-CM | POA: Diagnosis not present

## 2019-10-24 DIAGNOSIS — G629 Polyneuropathy, unspecified: Secondary | ICD-10-CM | POA: Diagnosis not present

## 2019-10-31 DIAGNOSIS — G629 Polyneuropathy, unspecified: Secondary | ICD-10-CM | POA: Diagnosis not present

## 2019-10-31 DIAGNOSIS — M48061 Spinal stenosis, lumbar region without neurogenic claudication: Secondary | ICD-10-CM | POA: Diagnosis not present

## 2019-10-31 DIAGNOSIS — M79671 Pain in right foot: Secondary | ICD-10-CM | POA: Diagnosis not present

## 2019-10-31 DIAGNOSIS — M1612 Unilateral primary osteoarthritis, left hip: Secondary | ICD-10-CM | POA: Diagnosis not present

## 2019-11-01 DIAGNOSIS — N6311 Unspecified lump in the right breast, upper outer quadrant: Secondary | ICD-10-CM | POA: Diagnosis not present

## 2019-11-02 DIAGNOSIS — G629 Polyneuropathy, unspecified: Secondary | ICD-10-CM | POA: Diagnosis not present

## 2019-11-02 DIAGNOSIS — M1612 Unilateral primary osteoarthritis, left hip: Secondary | ICD-10-CM | POA: Diagnosis not present

## 2019-11-02 DIAGNOSIS — M79671 Pain in right foot: Secondary | ICD-10-CM | POA: Diagnosis not present

## 2019-11-02 DIAGNOSIS — M48061 Spinal stenosis, lumbar region without neurogenic claudication: Secondary | ICD-10-CM | POA: Diagnosis not present

## 2019-11-06 DIAGNOSIS — M48061 Spinal stenosis, lumbar region without neurogenic claudication: Secondary | ICD-10-CM | POA: Diagnosis not present

## 2019-11-06 DIAGNOSIS — G629 Polyneuropathy, unspecified: Secondary | ICD-10-CM | POA: Diagnosis not present

## 2019-11-06 DIAGNOSIS — M1612 Unilateral primary osteoarthritis, left hip: Secondary | ICD-10-CM | POA: Diagnosis not present

## 2019-11-06 DIAGNOSIS — M79671 Pain in right foot: Secondary | ICD-10-CM | POA: Diagnosis not present

## 2019-11-08 DIAGNOSIS — G629 Polyneuropathy, unspecified: Secondary | ICD-10-CM | POA: Diagnosis not present

## 2019-11-08 DIAGNOSIS — M79671 Pain in right foot: Secondary | ICD-10-CM | POA: Diagnosis not present

## 2019-11-08 DIAGNOSIS — M48061 Spinal stenosis, lumbar region without neurogenic claudication: Secondary | ICD-10-CM | POA: Diagnosis not present

## 2019-11-08 DIAGNOSIS — M1612 Unilateral primary osteoarthritis, left hip: Secondary | ICD-10-CM | POA: Diagnosis not present

## 2019-11-13 DIAGNOSIS — G629 Polyneuropathy, unspecified: Secondary | ICD-10-CM | POA: Diagnosis not present

## 2019-11-13 DIAGNOSIS — M79671 Pain in right foot: Secondary | ICD-10-CM | POA: Diagnosis not present

## 2019-11-13 DIAGNOSIS — M1612 Unilateral primary osteoarthritis, left hip: Secondary | ICD-10-CM | POA: Diagnosis not present

## 2019-11-13 DIAGNOSIS — M48061 Spinal stenosis, lumbar region without neurogenic claudication: Secondary | ICD-10-CM | POA: Diagnosis not present

## 2019-11-15 DIAGNOSIS — G629 Polyneuropathy, unspecified: Secondary | ICD-10-CM | POA: Diagnosis not present

## 2019-11-15 DIAGNOSIS — M79671 Pain in right foot: Secondary | ICD-10-CM | POA: Diagnosis not present

## 2019-11-15 DIAGNOSIS — M1612 Unilateral primary osteoarthritis, left hip: Secondary | ICD-10-CM | POA: Diagnosis not present

## 2019-11-15 DIAGNOSIS — M48061 Spinal stenosis, lumbar region without neurogenic claudication: Secondary | ICD-10-CM | POA: Diagnosis not present

## 2019-11-20 DIAGNOSIS — M48061 Spinal stenosis, lumbar region without neurogenic claudication: Secondary | ICD-10-CM | POA: Diagnosis not present

## 2019-11-20 DIAGNOSIS — M1612 Unilateral primary osteoarthritis, left hip: Secondary | ICD-10-CM | POA: Diagnosis not present

## 2019-11-20 DIAGNOSIS — G629 Polyneuropathy, unspecified: Secondary | ICD-10-CM | POA: Diagnosis not present

## 2019-11-20 DIAGNOSIS — M79671 Pain in right foot: Secondary | ICD-10-CM | POA: Diagnosis not present

## 2019-11-21 DIAGNOSIS — D3132 Benign neoplasm of left choroid: Secondary | ICD-10-CM | POA: Diagnosis not present

## 2019-11-21 DIAGNOSIS — D3131 Benign neoplasm of right choroid: Secondary | ICD-10-CM | POA: Diagnosis not present

## 2019-11-21 DIAGNOSIS — H3402 Transient retinal artery occlusion, left eye: Secondary | ICD-10-CM | POA: Diagnosis not present

## 2019-11-21 DIAGNOSIS — H40013 Open angle with borderline findings, low risk, bilateral: Secondary | ICD-10-CM | POA: Diagnosis not present

## 2019-11-21 DIAGNOSIS — Z961 Presence of intraocular lens: Secondary | ICD-10-CM | POA: Diagnosis not present

## 2019-11-22 DIAGNOSIS — M48061 Spinal stenosis, lumbar region without neurogenic claudication: Secondary | ICD-10-CM | POA: Diagnosis not present

## 2019-11-22 DIAGNOSIS — M1612 Unilateral primary osteoarthritis, left hip: Secondary | ICD-10-CM | POA: Diagnosis not present

## 2019-11-22 DIAGNOSIS — G629 Polyneuropathy, unspecified: Secondary | ICD-10-CM | POA: Diagnosis not present

## 2019-11-22 DIAGNOSIS — M79671 Pain in right foot: Secondary | ICD-10-CM | POA: Diagnosis not present

## 2019-11-27 DIAGNOSIS — Z20828 Contact with and (suspected) exposure to other viral communicable diseases: Secondary | ICD-10-CM | POA: Diagnosis not present

## 2019-11-27 DIAGNOSIS — Z6832 Body mass index (BMI) 32.0-32.9, adult: Secondary | ICD-10-CM | POA: Diagnosis not present

## 2019-12-05 DIAGNOSIS — M48061 Spinal stenosis, lumbar region without neurogenic claudication: Secondary | ICD-10-CM | POA: Diagnosis not present

## 2019-12-05 DIAGNOSIS — M1612 Unilateral primary osteoarthritis, left hip: Secondary | ICD-10-CM | POA: Diagnosis not present

## 2019-12-05 DIAGNOSIS — M79671 Pain in right foot: Secondary | ICD-10-CM | POA: Diagnosis not present

## 2019-12-05 DIAGNOSIS — G629 Polyneuropathy, unspecified: Secondary | ICD-10-CM | POA: Diagnosis not present

## 2019-12-07 DIAGNOSIS — M48061 Spinal stenosis, lumbar region without neurogenic claudication: Secondary | ICD-10-CM | POA: Diagnosis not present

## 2019-12-07 DIAGNOSIS — M79671 Pain in right foot: Secondary | ICD-10-CM | POA: Diagnosis not present

## 2019-12-07 DIAGNOSIS — M1612 Unilateral primary osteoarthritis, left hip: Secondary | ICD-10-CM | POA: Diagnosis not present

## 2019-12-07 DIAGNOSIS — G629 Polyneuropathy, unspecified: Secondary | ICD-10-CM | POA: Diagnosis not present

## 2019-12-11 DIAGNOSIS — M1612 Unilateral primary osteoarthritis, left hip: Secondary | ICD-10-CM | POA: Diagnosis not present

## 2019-12-11 DIAGNOSIS — M48061 Spinal stenosis, lumbar region without neurogenic claudication: Secondary | ICD-10-CM | POA: Diagnosis not present

## 2019-12-11 DIAGNOSIS — M79671 Pain in right foot: Secondary | ICD-10-CM | POA: Diagnosis not present

## 2019-12-11 DIAGNOSIS — G629 Polyneuropathy, unspecified: Secondary | ICD-10-CM | POA: Diagnosis not present

## 2019-12-12 DIAGNOSIS — Z20828 Contact with and (suspected) exposure to other viral communicable diseases: Secondary | ICD-10-CM | POA: Diagnosis not present

## 2019-12-13 DIAGNOSIS — M48061 Spinal stenosis, lumbar region without neurogenic claudication: Secondary | ICD-10-CM | POA: Diagnosis not present

## 2019-12-13 DIAGNOSIS — G629 Polyneuropathy, unspecified: Secondary | ICD-10-CM | POA: Diagnosis not present

## 2019-12-13 DIAGNOSIS — M1612 Unilateral primary osteoarthritis, left hip: Secondary | ICD-10-CM | POA: Diagnosis not present

## 2019-12-13 DIAGNOSIS — M79671 Pain in right foot: Secondary | ICD-10-CM | POA: Diagnosis not present

## 2019-12-18 DIAGNOSIS — Z79899 Other long term (current) drug therapy: Secondary | ICD-10-CM | POA: Diagnosis not present

## 2019-12-18 DIAGNOSIS — N183 Chronic kidney disease, stage 3 unspecified: Secondary | ICD-10-CM | POA: Diagnosis not present

## 2019-12-18 DIAGNOSIS — M79671 Pain in right foot: Secondary | ICD-10-CM | POA: Diagnosis not present

## 2019-12-18 DIAGNOSIS — M48061 Spinal stenosis, lumbar region without neurogenic claudication: Secondary | ICD-10-CM | POA: Diagnosis not present

## 2019-12-18 DIAGNOSIS — M1612 Unilateral primary osteoarthritis, left hip: Secondary | ICD-10-CM | POA: Diagnosis not present

## 2019-12-18 DIAGNOSIS — E782 Mixed hyperlipidemia: Secondary | ICD-10-CM | POA: Diagnosis not present

## 2019-12-18 DIAGNOSIS — G629 Polyneuropathy, unspecified: Secondary | ICD-10-CM | POA: Diagnosis not present

## 2019-12-20 DIAGNOSIS — Z6832 Body mass index (BMI) 32.0-32.9, adult: Secondary | ICD-10-CM | POA: Diagnosis not present

## 2019-12-20 DIAGNOSIS — M79671 Pain in right foot: Secondary | ICD-10-CM | POA: Diagnosis not present

## 2019-12-20 DIAGNOSIS — M48061 Spinal stenosis, lumbar region without neurogenic claudication: Secondary | ICD-10-CM | POA: Diagnosis not present

## 2019-12-20 DIAGNOSIS — I10 Essential (primary) hypertension: Secondary | ICD-10-CM | POA: Diagnosis not present

## 2019-12-20 DIAGNOSIS — R0602 Shortness of breath: Secondary | ICD-10-CM | POA: Diagnosis not present

## 2019-12-20 DIAGNOSIS — R7303 Prediabetes: Secondary | ICD-10-CM | POA: Diagnosis not present

## 2019-12-20 DIAGNOSIS — G629 Polyneuropathy, unspecified: Secondary | ICD-10-CM | POA: Diagnosis not present

## 2019-12-20 DIAGNOSIS — M1612 Unilateral primary osteoarthritis, left hip: Secondary | ICD-10-CM | POA: Diagnosis not present

## 2019-12-20 DIAGNOSIS — E782 Mixed hyperlipidemia: Secondary | ICD-10-CM | POA: Diagnosis not present

## 2019-12-20 DIAGNOSIS — N183 Chronic kidney disease, stage 3 unspecified: Secondary | ICD-10-CM | POA: Diagnosis not present

## 2019-12-25 DIAGNOSIS — M1612 Unilateral primary osteoarthritis, left hip: Secondary | ICD-10-CM | POA: Diagnosis not present

## 2019-12-25 DIAGNOSIS — G629 Polyneuropathy, unspecified: Secondary | ICD-10-CM | POA: Diagnosis not present

## 2019-12-25 DIAGNOSIS — M79671 Pain in right foot: Secondary | ICD-10-CM | POA: Diagnosis not present

## 2019-12-25 DIAGNOSIS — M48061 Spinal stenosis, lumbar region without neurogenic claudication: Secondary | ICD-10-CM | POA: Diagnosis not present

## 2019-12-27 DIAGNOSIS — M79671 Pain in right foot: Secondary | ICD-10-CM | POA: Diagnosis not present

## 2019-12-27 DIAGNOSIS — G629 Polyneuropathy, unspecified: Secondary | ICD-10-CM | POA: Diagnosis not present

## 2019-12-27 DIAGNOSIS — M48061 Spinal stenosis, lumbar region without neurogenic claudication: Secondary | ICD-10-CM | POA: Diagnosis not present

## 2019-12-27 DIAGNOSIS — M1612 Unilateral primary osteoarthritis, left hip: Secondary | ICD-10-CM | POA: Diagnosis not present

## 2020-01-01 DIAGNOSIS — M79671 Pain in right foot: Secondary | ICD-10-CM | POA: Diagnosis not present

## 2020-01-01 DIAGNOSIS — M1612 Unilateral primary osteoarthritis, left hip: Secondary | ICD-10-CM | POA: Diagnosis not present

## 2020-01-01 DIAGNOSIS — G629 Polyneuropathy, unspecified: Secondary | ICD-10-CM | POA: Diagnosis not present

## 2020-01-01 DIAGNOSIS — M48061 Spinal stenosis, lumbar region without neurogenic claudication: Secondary | ICD-10-CM | POA: Diagnosis not present

## 2020-01-03 DIAGNOSIS — M1612 Unilateral primary osteoarthritis, left hip: Secondary | ICD-10-CM | POA: Diagnosis not present

## 2020-01-03 DIAGNOSIS — M79671 Pain in right foot: Secondary | ICD-10-CM | POA: Diagnosis not present

## 2020-01-03 DIAGNOSIS — G629 Polyneuropathy, unspecified: Secondary | ICD-10-CM | POA: Diagnosis not present

## 2020-01-03 DIAGNOSIS — M48061 Spinal stenosis, lumbar region without neurogenic claudication: Secondary | ICD-10-CM | POA: Diagnosis not present

## 2020-01-08 DIAGNOSIS — M48061 Spinal stenosis, lumbar region without neurogenic claudication: Secondary | ICD-10-CM | POA: Diagnosis not present

## 2020-01-08 DIAGNOSIS — G629 Polyneuropathy, unspecified: Secondary | ICD-10-CM | POA: Diagnosis not present

## 2020-01-08 DIAGNOSIS — Z9181 History of falling: Secondary | ICD-10-CM | POA: Diagnosis not present

## 2020-01-08 DIAGNOSIS — E785 Hyperlipidemia, unspecified: Secondary | ICD-10-CM | POA: Diagnosis not present

## 2020-01-08 DIAGNOSIS — M1612 Unilateral primary osteoarthritis, left hip: Secondary | ICD-10-CM | POA: Diagnosis not present

## 2020-01-08 DIAGNOSIS — Z6832 Body mass index (BMI) 32.0-32.9, adult: Secondary | ICD-10-CM | POA: Diagnosis not present

## 2020-01-08 DIAGNOSIS — Z1331 Encounter for screening for depression: Secondary | ICD-10-CM | POA: Diagnosis not present

## 2020-01-08 DIAGNOSIS — Z0000 Encounter for general adult medical examination without abnormal findings: Secondary | ICD-10-CM | POA: Diagnosis not present

## 2020-01-08 DIAGNOSIS — M79671 Pain in right foot: Secondary | ICD-10-CM | POA: Diagnosis not present

## 2020-01-10 DIAGNOSIS — G629 Polyneuropathy, unspecified: Secondary | ICD-10-CM | POA: Diagnosis not present

## 2020-01-10 DIAGNOSIS — M1612 Unilateral primary osteoarthritis, left hip: Secondary | ICD-10-CM | POA: Diagnosis not present

## 2020-01-10 DIAGNOSIS — M79671 Pain in right foot: Secondary | ICD-10-CM | POA: Diagnosis not present

## 2020-01-10 DIAGNOSIS — M48061 Spinal stenosis, lumbar region without neurogenic claudication: Secondary | ICD-10-CM | POA: Diagnosis not present

## 2020-01-15 DIAGNOSIS — M48061 Spinal stenosis, lumbar region without neurogenic claudication: Secondary | ICD-10-CM | POA: Diagnosis not present

## 2020-01-15 DIAGNOSIS — G629 Polyneuropathy, unspecified: Secondary | ICD-10-CM | POA: Diagnosis not present

## 2020-01-15 DIAGNOSIS — M1612 Unilateral primary osteoarthritis, left hip: Secondary | ICD-10-CM | POA: Diagnosis not present

## 2020-01-15 DIAGNOSIS — M79671 Pain in right foot: Secondary | ICD-10-CM | POA: Diagnosis not present

## 2020-01-17 DIAGNOSIS — M48061 Spinal stenosis, lumbar region without neurogenic claudication: Secondary | ICD-10-CM | POA: Diagnosis not present

## 2020-01-17 DIAGNOSIS — M79671 Pain in right foot: Secondary | ICD-10-CM | POA: Diagnosis not present

## 2020-01-17 DIAGNOSIS — M1612 Unilateral primary osteoarthritis, left hip: Secondary | ICD-10-CM | POA: Diagnosis not present

## 2020-01-17 DIAGNOSIS — G629 Polyneuropathy, unspecified: Secondary | ICD-10-CM | POA: Diagnosis not present

## 2020-01-22 DIAGNOSIS — G629 Polyneuropathy, unspecified: Secondary | ICD-10-CM | POA: Diagnosis not present

## 2020-01-22 DIAGNOSIS — M79671 Pain in right foot: Secondary | ICD-10-CM | POA: Diagnosis not present

## 2020-01-22 DIAGNOSIS — M1612 Unilateral primary osteoarthritis, left hip: Secondary | ICD-10-CM | POA: Diagnosis not present

## 2020-01-22 DIAGNOSIS — M48061 Spinal stenosis, lumbar region without neurogenic claudication: Secondary | ICD-10-CM | POA: Diagnosis not present

## 2020-01-24 DIAGNOSIS — M48061 Spinal stenosis, lumbar region without neurogenic claudication: Secondary | ICD-10-CM | POA: Diagnosis not present

## 2020-01-24 DIAGNOSIS — M79671 Pain in right foot: Secondary | ICD-10-CM | POA: Diagnosis not present

## 2020-01-24 DIAGNOSIS — M1612 Unilateral primary osteoarthritis, left hip: Secondary | ICD-10-CM | POA: Diagnosis not present

## 2020-01-24 DIAGNOSIS — G629 Polyneuropathy, unspecified: Secondary | ICD-10-CM | POA: Diagnosis not present

## 2020-01-29 DIAGNOSIS — M1612 Unilateral primary osteoarthritis, left hip: Secondary | ICD-10-CM | POA: Diagnosis not present

## 2020-01-29 DIAGNOSIS — M48061 Spinal stenosis, lumbar region without neurogenic claudication: Secondary | ICD-10-CM | POA: Diagnosis not present

## 2020-01-29 DIAGNOSIS — M79671 Pain in right foot: Secondary | ICD-10-CM | POA: Diagnosis not present

## 2020-01-29 DIAGNOSIS — G629 Polyneuropathy, unspecified: Secondary | ICD-10-CM | POA: Diagnosis not present

## 2020-01-31 DIAGNOSIS — M79671 Pain in right foot: Secondary | ICD-10-CM | POA: Diagnosis not present

## 2020-01-31 DIAGNOSIS — M48061 Spinal stenosis, lumbar region without neurogenic claudication: Secondary | ICD-10-CM | POA: Diagnosis not present

## 2020-01-31 DIAGNOSIS — M1612 Unilateral primary osteoarthritis, left hip: Secondary | ICD-10-CM | POA: Diagnosis not present

## 2020-01-31 DIAGNOSIS — G629 Polyneuropathy, unspecified: Secondary | ICD-10-CM | POA: Diagnosis not present

## 2020-02-06 DIAGNOSIS — M79671 Pain in right foot: Secondary | ICD-10-CM | POA: Diagnosis not present

## 2020-02-06 DIAGNOSIS — M48061 Spinal stenosis, lumbar region without neurogenic claudication: Secondary | ICD-10-CM | POA: Diagnosis not present

## 2020-02-06 DIAGNOSIS — G629 Polyneuropathy, unspecified: Secondary | ICD-10-CM | POA: Diagnosis not present

## 2020-02-06 DIAGNOSIS — M1612 Unilateral primary osteoarthritis, left hip: Secondary | ICD-10-CM | POA: Diagnosis not present

## 2020-02-08 DIAGNOSIS — G629 Polyneuropathy, unspecified: Secondary | ICD-10-CM | POA: Diagnosis not present

## 2020-02-08 DIAGNOSIS — M79671 Pain in right foot: Secondary | ICD-10-CM | POA: Diagnosis not present

## 2020-02-08 DIAGNOSIS — M48061 Spinal stenosis, lumbar region without neurogenic claudication: Secondary | ICD-10-CM | POA: Diagnosis not present

## 2020-02-08 DIAGNOSIS — M1612 Unilateral primary osteoarthritis, left hip: Secondary | ICD-10-CM | POA: Diagnosis not present

## 2020-02-13 DIAGNOSIS — M1612 Unilateral primary osteoarthritis, left hip: Secondary | ICD-10-CM | POA: Diagnosis not present

## 2020-02-13 DIAGNOSIS — G629 Polyneuropathy, unspecified: Secondary | ICD-10-CM | POA: Diagnosis not present

## 2020-02-13 DIAGNOSIS — M79671 Pain in right foot: Secondary | ICD-10-CM | POA: Diagnosis not present

## 2020-02-13 DIAGNOSIS — M48061 Spinal stenosis, lumbar region without neurogenic claudication: Secondary | ICD-10-CM | POA: Diagnosis not present

## 2020-02-15 DIAGNOSIS — M48061 Spinal stenosis, lumbar region without neurogenic claudication: Secondary | ICD-10-CM | POA: Diagnosis not present

## 2020-02-15 DIAGNOSIS — M79671 Pain in right foot: Secondary | ICD-10-CM | POA: Diagnosis not present

## 2020-02-15 DIAGNOSIS — G629 Polyneuropathy, unspecified: Secondary | ICD-10-CM | POA: Diagnosis not present

## 2020-02-15 DIAGNOSIS — M1612 Unilateral primary osteoarthritis, left hip: Secondary | ICD-10-CM | POA: Diagnosis not present

## 2020-02-20 DIAGNOSIS — M1612 Unilateral primary osteoarthritis, left hip: Secondary | ICD-10-CM | POA: Diagnosis not present

## 2020-02-20 DIAGNOSIS — M48061 Spinal stenosis, lumbar region without neurogenic claudication: Secondary | ICD-10-CM | POA: Diagnosis not present

## 2020-02-20 DIAGNOSIS — M79671 Pain in right foot: Secondary | ICD-10-CM | POA: Diagnosis not present

## 2020-02-20 DIAGNOSIS — G629 Polyneuropathy, unspecified: Secondary | ICD-10-CM | POA: Diagnosis not present

## 2020-02-22 DIAGNOSIS — M79671 Pain in right foot: Secondary | ICD-10-CM | POA: Diagnosis not present

## 2020-02-22 DIAGNOSIS — M48061 Spinal stenosis, lumbar region without neurogenic claudication: Secondary | ICD-10-CM | POA: Diagnosis not present

## 2020-02-22 DIAGNOSIS — M1612 Unilateral primary osteoarthritis, left hip: Secondary | ICD-10-CM | POA: Diagnosis not present

## 2020-02-22 DIAGNOSIS — G629 Polyneuropathy, unspecified: Secondary | ICD-10-CM | POA: Diagnosis not present

## 2020-02-27 DIAGNOSIS — G629 Polyneuropathy, unspecified: Secondary | ICD-10-CM | POA: Diagnosis not present

## 2020-02-27 DIAGNOSIS — M1612 Unilateral primary osteoarthritis, left hip: Secondary | ICD-10-CM | POA: Diagnosis not present

## 2020-02-27 DIAGNOSIS — M79671 Pain in right foot: Secondary | ICD-10-CM | POA: Diagnosis not present

## 2020-02-27 DIAGNOSIS — M48061 Spinal stenosis, lumbar region without neurogenic claudication: Secondary | ICD-10-CM | POA: Diagnosis not present

## 2020-02-29 DIAGNOSIS — G629 Polyneuropathy, unspecified: Secondary | ICD-10-CM | POA: Diagnosis not present

## 2020-02-29 DIAGNOSIS — M48061 Spinal stenosis, lumbar region without neurogenic claudication: Secondary | ICD-10-CM | POA: Diagnosis not present

## 2020-02-29 DIAGNOSIS — M79671 Pain in right foot: Secondary | ICD-10-CM | POA: Diagnosis not present

## 2020-02-29 DIAGNOSIS — M1612 Unilateral primary osteoarthritis, left hip: Secondary | ICD-10-CM | POA: Diagnosis not present

## 2020-04-23 DIAGNOSIS — D3131 Benign neoplasm of right choroid: Secondary | ICD-10-CM | POA: Diagnosis not present

## 2020-04-23 DIAGNOSIS — H40013 Open angle with borderline findings, low risk, bilateral: Secondary | ICD-10-CM | POA: Diagnosis not present

## 2020-04-23 DIAGNOSIS — H353131 Nonexudative age-related macular degeneration, bilateral, early dry stage: Secondary | ICD-10-CM | POA: Diagnosis not present

## 2020-04-23 DIAGNOSIS — R519 Headache, unspecified: Secondary | ICD-10-CM | POA: Diagnosis not present

## 2020-05-23 ENCOUNTER — Other Ambulatory Visit: Payer: Self-pay

## 2020-05-23 ENCOUNTER — Emergency Department (HOSPITAL_COMMUNITY): Payer: Medicare HMO

## 2020-05-23 ENCOUNTER — Observation Stay (HOSPITAL_COMMUNITY)
Admission: EM | Admit: 2020-05-23 | Discharge: 2020-05-25 | Disposition: A | Discharge status: Home / Self Care | Payer: Medicare HMO | Attending: Internal Medicine | Admitting: Internal Medicine

## 2020-05-23 DIAGNOSIS — N183 Chronic kidney disease, stage 3 unspecified: Secondary | ICD-10-CM | POA: Diagnosis not present

## 2020-05-23 DIAGNOSIS — E785 Hyperlipidemia, unspecified: Secondary | ICD-10-CM | POA: Diagnosis not present

## 2020-05-23 DIAGNOSIS — I10 Essential (primary) hypertension: Secondary | ICD-10-CM

## 2020-05-23 DIAGNOSIS — R079 Chest pain, unspecified: Secondary | ICD-10-CM

## 2020-05-23 DIAGNOSIS — I4891 Unspecified atrial fibrillation: Secondary | ICD-10-CM | POA: Diagnosis not present

## 2020-05-23 DIAGNOSIS — R0602 Shortness of breath: Secondary | ICD-10-CM | POA: Diagnosis not present

## 2020-05-23 DIAGNOSIS — Z79899 Other long term (current) drug therapy: Secondary | ICD-10-CM | POA: Diagnosis not present

## 2020-05-23 DIAGNOSIS — R7303 Prediabetes: Secondary | ICD-10-CM | POA: Insufficient documentation

## 2020-05-23 DIAGNOSIS — R0902 Hypoxemia: Secondary | ICD-10-CM | POA: Diagnosis not present

## 2020-05-23 DIAGNOSIS — Z7982 Long term (current) use of aspirin: Secondary | ICD-10-CM | POA: Diagnosis not present

## 2020-05-23 DIAGNOSIS — Z20822 Contact with and (suspected) exposure to covid-19: Secondary | ICD-10-CM | POA: Insufficient documentation

## 2020-05-23 DIAGNOSIS — R911 Solitary pulmonary nodule: Secondary | ICD-10-CM | POA: Diagnosis not present

## 2020-05-23 DIAGNOSIS — I129 Hypertensive chronic kidney disease with stage 1 through stage 4 chronic kidney disease, or unspecified chronic kidney disease: Secondary | ICD-10-CM | POA: Diagnosis not present

## 2020-05-23 DIAGNOSIS — R0789 Other chest pain: Principal | ICD-10-CM | POA: Insufficient documentation

## 2020-05-23 LAB — BASIC METABOLIC PANEL
Anion gap: 6 (ref 5–15)
BUN: 19 mg/dL (ref 8–23)
CO2: 30 mmol/L (ref 22–32)
Calcium: 9.2 mg/dL (ref 8.9–10.3)
Chloride: 100 mmol/L (ref 98–111)
Creatinine, Ser: 0.84 mg/dL (ref 0.44–1.00)
GFR calc Af Amer: >60 mL/min (ref >60)
GFR calc non Af Amer: >60 mL/min (ref >60)
Glucose, Bld: 126 mg/dL — ABNORMAL HIGH (ref 70–99)
Potassium: 4.3 mmol/L (ref 3.5–5.1)
Sodium: 136 mmol/L (ref 135–145)

## 2020-05-23 LAB — PROTIME-INR
INR: 1 (ref 0.8–1.2)
Prothrombin Time: 12.9 seconds (ref 11.4–15.2)

## 2020-05-23 LAB — CBC
HCT: 37.2 % (ref 36.0–46.0)
Hemoglobin: 12.4 g/dL (ref 12.0–15.0)
MCH: 32 pg (ref 26.0–34.0)
MCHC: 33.3 g/dL (ref 30.0–36.0)
MCV: 95.9 fL (ref 80.0–100.0)
Platelets: 258 10*3/uL (ref 150–400)
RBC: 3.88 MIL/uL (ref 3.87–5.11)
RDW: 14.1 % (ref 11.5–15.5)
WBC: 6.6 10*3/uL (ref 4.0–10.5)
nRBC: 0 % (ref 0.0–0.2)

## 2020-05-23 LAB — TROPONIN I (HIGH SENSITIVITY)
Troponin I (High Sensitivity): 5 ng/L (ref <18)
Troponin I (High Sensitivity): 6 ng/L (ref <18)

## 2020-05-23 LAB — D-DIMER, QUANTITATIVE: D-Dimer, Quant: 0.98 ug/mL-FEU — ABNORMAL HIGH (ref 0.00–0.50)

## 2020-05-23 MED ORDER — FENTANYL CITRATE (PF) 100 MCG/2ML IJ SOLN: 50.0000 ug | Freq: Once | INTRAMUSCULAR | Status: DC

## 2020-05-23 MED ORDER — IOHEXOL 350 MG/ML SOLN
100.0000 mL | Freq: Once | INTRAVENOUS | Status: AC | PRN
  Administered 2020-05-23: 100 mL via INTRAVENOUS

## 2020-05-23 MED ORDER — LIDOCAINE VISCOUS HCL 2 % MT SOLN
15.0000 mL | Freq: Once | OROMUCOSAL | Status: AC
  Administered 2020-05-23: 15 mL via ORAL
  Filled 2020-05-23: qty 15

## 2020-05-23 MED ORDER — ALUM & MAG HYDROXIDE-SIMETH 200-200-20 MG/5ML PO SUSP
30.0000 mL | Freq: Once | ORAL | Status: AC
  Administered 2020-05-23: 30 mL via ORAL
  Filled 2020-05-23: qty 30

## 2020-05-23 NOTE — ED Triage Notes (Signed)
Pt BIB  EMS from home, c/o cp starting 11 am, no hx STEMI, hx Afib and HTN. Pt received 324 asa and 2 sl nitro per EMS, some relief. VS stable, NAD A&O.

## 2020-05-23 NOTE — H&P (Addendum)
[name] PTW:[id number] DOB: July 12, 1931 DOA: 05/23/2020     PCP: Cyndi Bender, PA-C   Outpatient Specialists:  NONE    Patient arrived to ER on 05/23/20 at 1646 Referred by Attending Hayden Rasmussen, MD   Patient coming from: home Lives alone,  With family close by    Chief Complaint:  Chief Complaint  Patient presents with  . Chest Pain    HPI: [name] is a 86 y.o. female with medical history significant of atrial fibrillation and hypertension, CKD stage III, GERD, HLD, prediabetes,    Presented with   chest pain started today at 11 AM reported pain as sharp 7 out of 10 pain started during regular activity but somewhat worse with activity no alleviation.  Appearing under left breast.  No associated diaphoresis nausea vomiting or lightheadedness.  Patient denies history of CAD EMS was called patient was given 324 mg of aspirin and 2 sublingual nitro and seem to have helped vital signs were stable.  Patient was transferred to [facility], ER  reports pain with inspiration and no BM for the past 2 days  Infectious risk factors:  Reports chest pain,    Has been vaccinated against COVID FEbruary   Initial COVID TEST  NEGATIVE   Lab Results  Component Value Date   [place] NEGATIVE 05/23/2020     Regarding pertinent Chronic problems:     Hyperlipidemia -  Not on statins    HTN on losartan hydrochlorothiazide    DM 2 -  diet controlled      A. Fib -  In 2011-  CHA2DS2/VAS Stroke Risk Points      N/A >= 2       I was isolated she was never put on blood thinners since       Now in sinus   CKD stage III -currently creatinine 0.84   Lab Results  Component Value Date   CREATININE 0.84 05/23/2020   CREATININE 0.98 01/04/2009   CREATININE 1.00 01/03/2009      While in ER: Troponin stable D-dimer mildly elevated 0.98 but CT chest showed no PE but multiple nodules throughout the lungs    Hospitalist was called for admission for chest  pain  The following Work up has been ordered so far:  Orders Placed This Encounter  Procedures  . SARS Coronavirus 2 by RT PCR (hospital order, performed in [facility] hospital lab) Nasopharyngeal Nasopharyngeal Swab  . DG Chest 1 View  . CT Angio Chest PE W/Cm &/Or Wo Cm  . Basic metabolic panel  . CBC  . Protime-INR  . D-dimer, quantitative  . Cardiac monitoring  . Consult for [place] Admission  ALL PATIENTS BEING ADMITTED/HAVING PROCEDURES NEED COVID-19 SCREENING  . Pulse oximetry, continuous  . ED EKG  . EKG 12-Lead  . Insert peripheral IV   Following Medications were ordered in ER: Medications  fentaNYL (SUBLIMAZE) injection 50 mcg (0 mcg Intravenous Hold 05/23/20 1839)  iohexol (OMNIPAQUE) 350 MG/ML injection 100 mL (100 mLs Intravenous Contrast Given 05/23/20 2037)  alum & mag hydroxide-simeth (MAALOX/MYLANTA) 200-200-20 MG/5ML suspension 30 mL (30 mLs Oral Given 05/23/20 2156)    And  lidocaine (XYLOCAINE) 2 % viscous mouth solution 15 mL (15 mLs Oral Given 05/23/20 2155)   Significant initial  Findings: Abnormal Labs Reviewed  BASIC METABOLIC PANEL - Abnormal; Notable for the following components:      Result Value   Glucose, Bld 126 (*)    All  other components within normal limits  D-DIMER, QUANTITATIVE (NOT AT [facility]) - Abnormal; Notable for the following components:   D-Dimer, Quant 0.98 (*)    All other components within normal limits    Otherwise labs showing:    Recent Labs  Lab 05/23/20 1745  NA 136  K 4.3  CO2 30  GLUCOSE 126*  BUN 19  CREATININE 0.84  CALCIUM 9.2    Cr  stable,   Lab Results  Component Value Date   CREATININE 0.84 05/23/2020   CREATININE 0.98 01/04/2009   CREATININE 1.00 01/03/2009    No results for input(s): AST, ALT, ALKPHOS, BILITOT, PROT, ALBUMIN in the last 168 hours. Lab Results  Component Value Date   CALCIUM 9.2 05/23/2020    WBC     Component Value Date/Time   WBC 6.6 05/23/2020 1745   ANC     Component Value Date/Time   NEUTROABS 4.2 05/09/2008 1120   ALC No components found for: LYMPHAB   Plt: Lab Results  Component Value Date   PLT 258 05/23/2020   COVID-19 Labs  Recent Labs    05/23/20 1922  DDIMER 0.98*    No results found for: SARSCOV2NAA  HG/HCT  stable,    Component Value Date/Time   HGB 12.4 05/23/2020 1745   HCT 37.2 05/23/2020 1745   Troponin 5-6 Cardiac Panel (last 3 results) No results for input(s): CKTOTAL, CKMB, TROPONINI, RELINDX in the last 72 hours.    ECG: Ordered Personally reviewed by me showing: HR : 71 Rhythm: NSR nonspecific changes QTC 416    UA  not ordered      Ordered    CXR -  NON acute   CTA chest -  Non-acute pulmonary nodules     ED Triage Vitals  Enc Vitals Group     BP 05/23/20 1651 (!) 165/76     Pulse Rate 05/23/20 1651 77     Resp 05/23/20 1651 18     Temp 05/23/20 1651 98.1 F (36.7 C)     Temp Source 05/23/20 1651 Oral     SpO2 05/23/20 1651 96 %     Weight 05/23/20 1651 205 lb (93 kg)     Height 05/23/20 1651 5\' 7"  (1.702 m)     Head Circumference --      Peak Flow --      Pain Score 05/23/20 1649 7     Pain Loc --      Pain Edu? --      Excl. in [place]? --   TMAX(24)@       Latest  Blood pressure (!) 172/83, pulse 72, temperature 98.1 F (36.7 C), temperature source Oral, resp. rate 19, height 5\' 7"  (1.702 m), weight 93 kg, SpO2 96 %.    Review of Systems:    Pertinent positives include chest pain,   Constitutional:  No weight loss, night sweats, Fevers, chills, fatigue, weight loss  HEENT:  No headaches, Difficulty swallowing,Tooth/dental problems,Sore throat,  No sneezing, itching, ear ache, nasal congestion, post nasal drip,  Cardio-vascular:  No Orthopnea, PND, anasarca, dizziness, palpitations.no Bilateral lower extremity swelling  GI:  No heartburn, indigestion, abdominal pain, nausea, vomiting, diarrhea, change in bowel habits, loss of appetite, melena, blood in stool,  hematemesis Resp:  no shortness of breath at rest. No dyspnea on exertion, No excess mucus, no productive cough, No non-productive cough, No coughing up of blood.No change in color of mucus.No wheezing. Skin:  no rash or lesions. No jaundice GU:  no dysuria, change in color of urine, no urgency or frequency. No straining to urinate.  No flank pain.  Musculoskeletal:  No joint pain or no joint swelling. No decreased range of motion. No back pain.  Psych:  No change in mood or affect. No depression or anxiety. No memory loss.  Neuro: no localizing neurological complaints, no tingling, no weakness, no double vision, no gait abnormality, no slurred speech, no confusion  All systems reviewed and apart from [place] all are negative  Past Medical History:   Past Medical History:  Diagnosis Date  . Chronic renal insufficiency, stage III (moderate)   . DDD (degenerative disc disease), lumbar   . Diverticulosis   . Dizziness   . GERD (gastroesophageal reflux disease)   . Herpes simplex infection   . History of anemia   . History of atrial fibrillation   . HTN (hypertension)   . Mixed hyperlipidemia   . Osteoarthritis   . Osteopenia   . Prediabetes   . SOB (shortness of breath) on exertion   . Spinal stenosis of lumbar region at multiple levels       No past surgical history on file.  Social History:  Ambulatory   independently       reports that she has never smoked. She has never used smokeless tobacco. She reports that she does not drink alcohol and does not use drugs.   Family History:   Family History  Problem Relation Age of Onset  . Heart attack Mother   . Diabetes Mother   . Heart attack Father   . Other Father        aminolysis of kidney    Allergies: Allergies  Allergen Reactions  . Adhesive [Tape] Other (See Comments)    PEELS OFF THE SKIN!!  . Clindamycin/Lincomycin Nausea And Vomiting  . Codeine Nausea Only  . Dilaudid [Hydromorphone Hcl] Nausea And  Vomiting  . Fenofibrate Other (See Comments)    Reaction not recalled  . Morphine And Related Nausea And Vomiting  . Sulfa Antibiotics Hives  . Sulfur Hives and Other (See Comments)    Should this be "Sulfa" (??)     Prior to Admission medications   Medication Sig Start Date End Date Taking? Authorizing Provider  Cholecalciferol (VITAMIN D3) 1000 units CAPS Take 1,000 Units by mouth daily.    Yes [provider]  fluticasone (FLONASE) 50 MCG/ACT nasal spray Place 1-2 sprays into both nostrils daily as needed for allergies or rhinitis.  02/17/17  Yes [provider]  gabapentin (NEURONTIN) 100 MG capsule Take 100 mg by mouth at bedtime.   Yes [provider]  losartan-hydrochlorothiazide (HYZAAR) 100-25 MG tablet Take 1 tablet by mouth daily.   Yes [provider]  Multiple Vitamins-Minerals (PRESERVISION AREDS PO) Take 2 capsules by mouth at bedtime.    Yes [provider]  naproxen sodium (ALEVE) 220 MG tablet Take 220-440 mg by mouth 2 (two) times daily as needed (for headaches or pain).   Yes [provider]  omeprazole (PRILOSEC OTC) 20 MG tablet Take 20 mg by mouth daily before breakfast.   Yes [provider]  traMADol (ULTRAM) 50 MG tablet Take 50-100 mg by mouth See admin instructions. Take 50-100 mg by mouth every six to eight hours as needed for pain 05/14/17  Yes Hyatt, Max T, DPM  valACYclovir (VALTREX) 1000 MG tablet Take 1,000 mg by mouth every 12 (twelve) hours as needed (for fever blisters and no more than 4  tablets total per episode).    Yes [provider]  vitamin E 400 UNIT capsule Take 400 Units by mouth daily.   Yes [provider]  aspirin EC 81 MG tablet Take 81 mg by mouth daily. Patient not taking: Reported on 05/23/2020    [provider]  doxycycline (VIBRAMYCIN) 100 MG capsule Take 1 capsule (100 mg total) by mouth 2 (two) times daily. Patient not taking: Reported on 05/23/2020  05/27/17   Trula Slade, DPM  ondansetron (ZOFRAN) 4 MG tablet Take 1 tablet (4 mg total) by mouth every 8 (eight) hours as needed. Patient not taking: Reported on 05/23/2020 05/12/17   Cedric Fishman  VENTOLIN HFA 108 [phone]) MCG/ACT inhaler  04/29/17   [provider]   Physical Exam: Vitals with BMI 05/23/2020 05/23/2020 05/23/2020  Height - - -  Weight - - -  BMI - - -  Systolic - - -  Diastolic - - -  Pulse 72 71 75    1. General:  in No Acute distress   Chronically ill -appearing 2. Psychological: Alert and   Oriented 3. Head/ENT:   Dry Mucous Membranes                          Head Non traumatic, neck supple                           Poor Dentition CP wall pain reproducible 4. SKIN:  decreased Skin turgor,  Skin clean Dry and intact no rash 5. Heart: Regular rate and rhythm  Murmur, no Rub or gallop 6. Lungs:   no wheezes or crackles   7. Abdomen: Soft,  non-tender,   distended  bowel sounds present 8. Lower extremities: no clubbing, cyanosis, no  edema 9. Neurologically Grossly intact, moving all 4 extremities equally   10. MSK: Normal range of motion   All other LABS:     Recent Labs  Lab 05/23/20 1745  WBC 6.6  HGB 12.4  HCT 37.2  MCV 95.9  PLT 258     Recent Labs  Lab 05/23/20 1745  NA 136  K 4.3  CL 100  CO2 30  GLUCOSE 126*  BUN 19  CREATININE 0.84  CALCIUM 9.2     No results for input(s): AST, ALT, ALKPHOS, BILITOT, PROT, ALBUMIN in the last 168 hours.     Cultures: No results found for: SDES, [place], CULT, REPTSTATUS   Radiological Exams on Admission: DG Chest 1 View  Result Date: 05/23/2020 CLINICAL DATA:  Chest pain. EXAM: CHEST  1 VIEW COMPARISON:  Chest x-ray dated Apr 29, 2017. FINDINGS: The heart size and mediastinal contours are within normal limits. Both lungs are clear. The visualized skeletal structures are unremarkable. IMPRESSION: No active disease. Electronically Signed   By: Titus Dubin M.D.   On:  05/23/2020 17:45   CT Angio Chest PE W/Cm &/Or Wo Cm  Result Date: 05/23/2020 CLINICAL DATA:  Shortness of breath EXAM: CT ANGIOGRAPHY CHEST WITH CONTRAST TECHNIQUE: Multidetector CT imaging of the chest was performed using the standard protocol during bolus administration of intravenous contrast. Multiplanar CT image reconstructions and MIPs were obtained to evaluate the vascular anatomy. CONTRAST:  147mL OMNIPAQUE IOHEXOL 350 MG/ML SOLN COMPARISON:  Chest x-ray 05/23/2020, CT 04/09/2006 FINDINGS: Cardiovascular: Satisfactory opacification of the pulmonary arteries to the segmental level. No evidence of pulmonary embolism. Nonaneurysmal aorta. Moderate aortic  atherosclerosis. Coronary vascular calcification. Normal heart size. No pericardial effusion Mediastinum/Nodes: Midline trachea. No suspicious thyroid mass. No significant mediastinal adenopathy. Esophagus shows small hiatal hernia Lungs/Pleura: No consolidation or pleural effusion. Patchy perihilar lung densities possible atelectasis or small airways disease. Multiple bilateral pulmonary nodules, for example 4 mm left upper lobe lung nodule, series 6 image number 26. 6 mm right middle lobe lung nodule, mildly increased in size, series 6, image number 45. Multiple additional punctate nodules in the right upper lobe and left lower lobe. Upper Abdomen: Colon diverticular disease.  No acute abnormality Musculoskeletal: No chest wall abnormality. No acute or significant osseous findings. Probable hemangioma in the T7 vertebral body. Review of the MIP images confirms the above findings. IMPRESSION: 1. Negative for acute pulmonary embolus. No acute pulmonary airspace disease. 2. Multiple bilateral lung nodules measuring up to 6 mm in size. Non-contrast chest CT at 3-6 months is recommended. If the nodules are stable at time of repeat CT, then future CT at 18-24 months (from today's scan) is considered optional for low-risk patients, but is recommended for  high-risk patients. This recommendation follows the consensus statement: Guidelines for Management of Incidental Pulmonary Nodules Detected on CT Images: From the Fleischner Society 2017; Radiology 2017; 284:228-243. Aortic Atherosclerosis (ICD10-I70.0). Electronically Signed   By: Donavan Foil M.D.   On: 05/23/2020 20:58    Chart has been reviewed   Assessment/Plan  86 y.o. female with medical history significant of atrial fibrillation and hypertension, CKD stage III, GERD, HLD, prediabetes,  Admitted for persistent chest pain worse with exertion negative troponin and no evidence of ischemia on EKG  Present on Admission: . Chest pain - - H=   2   ,E=0  ,A=  2 , R  2 , T 0 ,  for the  Total of 6  therefore will admit for observation and further evaluation ( Risk of MACE: Scores 0-3  of 0.9-1.7%.,  4-6: 12-16.6% , Scores ?7: 50-65% ) - CT A negative for significant PE   - troponin unremarkable  -No acute ischemic changes on ECG   -  Other explanation for chest pain could be   musculoskeletal   - monitor on telemetry, cycle cardiac enzymes, obtain serial ECG and  ECHO in AM.   - Daily aspirin -  Further risk stratify with lipid panel, hgA1C, obtain TSH.  Make sure patient is on Aspirin.  We will notify cardiology regarding patient's admission. Further management depends on pending  workup  . Essential hypertension -stable continue home medication  . Pulmonary nodule -patient will need outpatient follow-up regarding   . HLD (hyperlipidemia) chronic not on statin will check lipid panel  Constipation - bowel regimen  Hx of urinary retention - will order bladder scan Hx of renal mass removal with now hematuria - would benefit from additional follow urology denies dysuria  Other plan as per orders.  DVT prophylaxis:   Lovenox      Code Status:    Code Status: Not on file FULL CODE  as per patient    I had personally discussed CODE STATUS with patient      Family Communication:    Family   at  Bedside  plan of care was discussed on the phone with   Daughter   Disposition Plan:      To home once workup is complete and patient is stable    Following barriers for discharge:  Chest pain                              EXPECT DC tomorrow                                       Consults called:   email cardiology   Admission status:  ED Disposition    ED Disposition Condition [place]: Isabel [[id number]]  Level of Care: Progressive [102]  Admit to Progressive based on following criteria: CARDIOVASCULAR & THORACIC of moderate stability with acute coronary syndrome symptoms/low risk myocardial infarction/hypertensive urgency/arrhythmias/heart failure potentially compromising stability and stable post cardiovascular intervention patients.  Covid Evaluation: Asymptomatic Screening Protocol (No Symptoms)  Diagnosis: Chest pain [[id number]]  Admitting Physician: Toy Baker [[id number]  Attending Physician: Toy Baker [[id number]]       Obs     Level of care   progressive tele indefinitely please discontinue once patient no longer qualifies COVID-19 Labs   Lab Results  Component Value Date   [place] 05/23/2020     Precautions: admitted as Covid Negative     PPE: Used by the provider:   N95  eye Goggles,  Gloves    Younes Degeorge 05/24/2020, 12:49 AM    [facility]     after 2 AM please page floor coverage PA If 7AM-7PM, please contact the day team taking care of the patient using [email or web address]   Patient was evaluated in the context of the global COVID-19 pandemic, which necessitated consideration that the patient might be at risk for infection with the SARS-CoV-2 virus that causes COVID-19. Institutional protocols and algorithms that pertain to the evaluation of patients at risk for COVID-19 are in a state of rapid change based on information released by regulatory  bodies including the CDC and federal and state organizations. These policies and algorithms were followed during the patient's care.

## 2020-05-23 NOTE — ED Notes (Signed)
Patient transported to CT 

## 2020-05-23 NOTE — ED Provider Notes (Signed)
[facility] Provider Note   CSN: [id number] Arrival date & time: 05/23/20  1646     History Chief Complaint  Patient presents with  . Chest Pain    [name] is a 86 y.o. female.  She has a history of A. fib and CKD.  Complaining of sharp chest pain 7 out of 10 intensity that began at 11 AM during regular activity.  Possibly worse with activity.  No radiation.  Occurring under her left breast.  No diaphoresis nausea vomiting lightheadedness.  Given aspirin and 2 nitro by EMS with minimal change.  No prior history of MI.  No recent illness.  The history is provided by the patient.  Chest Pain Pain location:  L chest Pain quality: sharp   Pain radiates to:  Does not radiate Pain severity now: 7/10. Onset quality:  Sudden Duration:  6 hours Timing:  Constant Progression:  Unchanged Chronicity:  New Relieved by:  Nothing Worsened by:  Exertion Ineffective treatments:  Aspirin and nitroglycerin Associated symptoms: no abdominal pain, no altered mental status, no cough, no diaphoresis, no fever, no heartburn, no lower extremity edema, no nausea, no numbness, no shortness of breath and no syncope   Risk factors: hypertension        Past Medical History:  Diagnosis Date  . Chronic renal insufficiency, stage III (moderate)   . DDD (degenerative disc disease), lumbar   . Diverticulosis   . Dizziness   . GERD (gastroesophageal reflux disease)   . Herpes simplex infection   . History of anemia   . History of atrial fibrillation   . HTN (hypertension)   . Mixed hyperlipidemia   . Osteoarthritis   . Osteopenia   . Prediabetes   . SOB (shortness of breath) on exertion   . Spinal stenosis of lumbar region at multiple levels     Patient Active Problem List   Diagnosis Date Noted  . Chest pain, atypical 02/23/2017  . Disequilibrium 02/23/2017  . Essential hypertension 02/23/2017  . Dizziness 02/12/2017  . Chronic headaches  02/12/2017  . Lightheadedness 02/12/2017  . Chest tightness 02/12/2017  . Shoulder pain 02/12/2017  . Dyspnea 02/12/2017    No past surgical history on file.   OB History   No obstetric history on file.     Family History  Problem Relation Age of Onset  . Heart attack Mother   . Diabetes Mother   . Heart attack Father   . Other Father        aminolysis of kidney    Social History   Tobacco Use  . Smoking status: Never Smoker  . Smokeless tobacco: Never Used  Substance Use Topics  . Alcohol use: No  . Drug use: No    Home Medications Prior to Admission medications   Medication Sig Start Date End Date Taking? Authorizing Provider  aspirin EC 81 MG tablet Take 81 mg by mouth daily.    [provider]  Cholecalciferol (VITAMIN D3) 1000 units CAPS Take 1,000 Units by mouth daily.     [provider]  doxycycline (VIBRAMYCIN) 100 MG capsule Take 1 capsule (100 mg total) by mouth 2 (two) times daily. 05/27/17   Trula Slade, DPM  fluticasone (FLONASE) 50 MCG/ACT nasal spray Place 1 spray into both nostrils daily. 02/17/17   [provider]  losartan-hydrochlorothiazide (HYZAAR) 100-25 MG tablet Take 1 tablet by mouth daily.    [provider]  Multiple Vitamins-Minerals (PRESERVISION AREDS  PO) Take 1 tablet by mouth daily.     [provider]  omeprazole (PRILOSEC) 20 MG capsule Take 20 mg by mouth daily.    [provider]  ondansetron (ZOFRAN) 4 MG tablet Take 1 tablet (4 mg total) by mouth every 8 (eight) hours as needed. 05/12/17   Hyatt, Max T, DPM  traMADol (ULTRAM) 50 MG tablet Take 50 mg by mouth every 6 (six) hours as needed (Take 1-2 tablets by mouth every six to eight hours as needed for pain). 05/14/17   Hyatt, Max T, DPM  valACYclovir (VALTREX) 1000 MG tablet Take 1,000 mg by mouth 2 (two) times daily as needed (fever blisters). 2 MORE TABLETS IN 12 HR TOTAL OF 4 TABLETS PER EPISODE     [provider]   VENTOLIN HFA 108 (90 Base) MCG/ACT inhaler  04/29/17   [provider]  vitamin E 400 UNIT capsule Take 400 Units by mouth daily.    [provider]    Allergies    Adhesive [tape], Clindamycin/lincomycin, Codeine, Dilaudid [hydromorphone hcl], Fenofibrate, Morphine and related, and Sulfur  Review of Systems   Review of Systems  Constitutional: Negative for diaphoresis and fever.  HENT: Negative for sore throat.   Eyes: Negative for visual disturbance.  Respiratory: Negative for cough and shortness of breath.   Cardiovascular: Positive for chest pain. Negative for syncope.  Gastrointestinal: Negative for abdominal pain, heartburn and nausea.  Genitourinary: Negative for dysuria.  Musculoskeletal: Negative for neck pain.  Skin: Negative for rash.  Neurological: Negative for numbness.    Physical Exam Updated Vital Signs BP (!) 165/76 (BP Location: Left Arm)   Pulse 77   Temp 98.1 F (36.7 C) (Oral)   Resp 18   Ht 5\' 7"  (1.702 m)   Wt 93 kg   SpO2 96%   BMI 32.11 kg/m   Physical Exam Vitals and nursing note reviewed.  Constitutional:      General: She is not in acute distress.    Appearance: She is well-developed.  HENT:     Head: Normocephalic and atraumatic.  Eyes:     Conjunctiva/sclera: Conjunctivae normal.  Cardiovascular:     Rate and Rhythm: Normal rate and regular rhythm.     Heart sounds: Normal heart sounds. No murmur heard.   Pulmonary:     Effort: Pulmonary effort is normal. No respiratory distress.     Breath sounds: Normal breath sounds.  Abdominal:     Palpations: Abdomen is soft.     Tenderness: There is no abdominal tenderness.  Musculoskeletal:        General: Normal range of motion.     Cervical back: Neck supple.     Right lower leg: No tenderness. No edema.     Left lower leg: No tenderness. No edema.  Skin:    General: Skin is warm and dry.     Capillary Refill: Capillary refill takes less than 2 seconds.    Neurological:     General: No focal deficit present.     Mental Status: She is alert.     ED Results / Procedures / Treatments   Labs (all labs ordered are listed, but only abnormal results are displayed) Labs Reviewed  BASIC METABOLIC PANEL - Abnormal; Notable for the following components:      Result Value   Glucose, Bld 126 (*)    All other components within normal limits  D-DIMER, QUANTITATIVE (NOT AT [facility]) - Abnormal; Notable for the following components:  D-Dimer, Quant 0.98 (*)    All other components within normal limits  URINALYSIS, ROUTINE W REFLEX MICROSCOPIC - Abnormal; Notable for the following components:   APPearance HAZY (*)    Hgb urine dipstick MODERATE (*)    Leukocytes,Ua LARGE (*)    WBC, UA >50 (*)    Bacteria, UA RARE (*)    Non Squamous Epithelial 0-5 (*)    All other components within normal limits  LIPID PANEL - Abnormal; Notable for the following components:   Cholesterol 209 (*)    Triglycerides 159 (*)    LDL Cholesterol 128 (*)    All other components within normal limits  TSH - Abnormal; Notable for the following components:   TSH 5.610 (*)    All other components within normal limits  COMPREHENSIVE METABOLIC PANEL - Abnormal; Notable for the following components:   Glucose, Bld 122 (*)    Albumin 3.4 (*)    All other components within normal limits  SARS CORONAVIRUS 2 BY RT PCR (HOSPITAL ORDER, [facility])  URINE CULTURE  CBC  PROTIME-INR  MAGNESIUM  PHOSPHORUS  CBC WITH DIFFERENTIAL/PLATELET  TROPONIN I (HIGH SENSITIVITY)  TROPONIN I (HIGH SENSITIVITY)  TROPONIN I (HIGH SENSITIVITY)  TROPONIN I (HIGH SENSITIVITY)    EKG EKG Interpretation  Date/Time:  Thursday May 23 2020 18:07:52 EDT Ventricular Rate:  71 PR Interval:    QRS Duration: 103 QT Interval:  382 QTC Calculation: 416 R Axis:   -19 Text Interpretation: Sinus rhythm Borderline left axis deviation Borderline ST elevation, anterior leads  more prominant st e;evation anterior than prior 6/09 Confirmed by Aletta Edouard [phone]) on 05/23/2020 6:10:52 PM Also confirmed by Aletta Edouard [phone]), editor [place], [place] [phone])  on 05/24/2020 9:03:31 AM   Radiology DG Chest 1 View  Result Date: 05/23/2020 CLINICAL DATA:  Chest pain. EXAM: CHEST  1 VIEW COMPARISON:  Chest x-ray dated Apr 29, 2017. FINDINGS: The heart size and mediastinal contours are within normal limits. Both lungs are clear. The visualized skeletal structures are unremarkable. IMPRESSION: No active disease. Electronically Signed   By: Titus Dubin M.D.   On: 05/23/2020 17:45   CT Angio Chest PE W/Cm &/Or Wo Cm  Result Date: 05/23/2020 CLINICAL DATA:  Shortness of breath EXAM: CT ANGIOGRAPHY CHEST WITH CONTRAST TECHNIQUE: Multidetector CT imaging of the chest was performed using the standard protocol during bolus administration of intravenous contrast. Multiplanar CT image reconstructions and MIPs were obtained to evaluate the vascular anatomy. CONTRAST:  156mL OMNIPAQUE IOHEXOL 350 MG/ML SOLN COMPARISON:  Chest x-ray 05/23/2020, CT 04/09/2006 FINDINGS: Cardiovascular: Satisfactory opacification of the pulmonary arteries to the segmental level. No evidence of pulmonary embolism. Nonaneurysmal aorta. Moderate aortic atherosclerosis. Coronary vascular calcification. Normal heart size. No pericardial effusion Mediastinum/Nodes: Midline trachea. No suspicious thyroid mass. No significant mediastinal adenopathy. Esophagus shows small hiatal hernia Lungs/Pleura: No consolidation or pleural effusion. Patchy perihilar lung densities possible atelectasis or small airways disease. Multiple bilateral pulmonary nodules, for example 4 mm left upper lobe lung nodule, series 6 image number 26. 6 mm right middle lobe lung nodule, mildly increased in size, series 6, image number 45. Multiple additional punctate nodules in the right upper lobe and left lower lobe. Upper Abdomen: Colon  diverticular disease.  No acute abnormality Musculoskeletal: No chest wall abnormality. No acute or significant osseous findings. Probable hemangioma in the T7 vertebral body. Review of the MIP images confirms the above findings. IMPRESSION: 1. Negative for acute pulmonary embolus. No acute  pulmonary airspace disease. 2. Multiple bilateral lung nodules measuring up to 6 mm in size. Non-contrast chest CT at 3-6 months is recommended. If the nodules are stable at time of repeat CT, then future CT at 18-24 months (from today's scan) is considered optional for low-risk patients, but is recommended for high-risk patients. This recommendation follows the consensus statement: Guidelines for Management of Incidental Pulmonary Nodules Detected on CT Images: From the Fleischner Society 2017; Radiology 2017; 284:228-243. Aortic Atherosclerosis (ICD10-I70.0). Electronically Signed   By: Donavan Foil M.D.   On: 05/23/2020 20:58    Procedures Procedures (including critical care time)  Medications Ordered in ED Medications  fentaNYL (SUBLIMAZE) injection 50 mcg (0 mcg Intravenous Hold 05/23/20 1839)  aspirin EC tablet 81 mg (has no administration in time range)  gabapentin (NEURONTIN) capsule 100 mg (100 mg Oral Given 05/24/20 0157)  pantoprazole (PROTONIX) EC tablet 40 mg (40 mg Oral Not Given 05/24/20 0802)  traMADol (ULTRAM) tablet 50-100 mg (50 mg Oral Given 05/24/20 0157)  sodium chloride flush (NS) 0.9 % injection 3 mL (3 mLs Intravenous Not Given 05/24/20 0906)  acetaminophen (TYLENOL) tablet 650 mg (has no administration in time range)    Or  acetaminophen (TYLENOL) suppository 650 mg (has no administration in time range)  HYDROcodone-acetaminophen (NORCO/VICODIN) 5-325 MG per tablet 1-2 tablet (has no administration in time range)  docusate sodium (COLACE) capsule 100 mg (100 mg Oral Given 05/24/20 0157)  ondansetron (ZOFRAN) tablet 4 mg (has no administration in time range)    Or  ondansetron (ZOFRAN)  injection 4 mg (has no administration in time range)  enoxaparin (LOVENOX) injection 40 mg (has no administration in time range)  0.9 %  sodium chloride infusion ( Intravenous New Bag/Given 05/24/20 0157)  polyethylene glycol (MIRALAX / GLYCOLAX) packet 17 g (has no administration in time range)  bisacodyl (DULCOLAX) suppository 10 mg (has no administration in time range)  losartan (COZAAR) tablet 100 mg (has no administration in time range)    And  hydrochlorothiazide (HYDRODIURIL) tablet 25 mg (has no administration in time range)  iohexol (OMNIPAQUE) 350 MG/ML injection 100 mL (100 mLs Intravenous Contrast Given 05/23/20 2037)  alum & mag hydroxide-simeth (MAALOX/MYLANTA) 200-200-20 MG/5ML suspension 30 mL (30 mLs Oral Given 05/23/20 2156)    And  lidocaine (XYLOCAINE) 2 % viscous mouth solution 15 mL (15 mLs Oral Given 05/23/20 2155)    ED Course  I have reviewed the triage vital signs and the nursing notes.  Pertinent labs & imaging results that were available during my care of the patient were reviewed by me and considered in my medical decision making (see chart for details).  Clinical Course as of May 24 999  Thu May 23, 2020  1752 Chest x-ray interpreted by me as no acute infiltrates.   [MB]  2242 Patient had no response to the GI cocktail.  Remains with chest pain.  Her hear score is elevated so I do not think discharge would be reasonable at her age.   [MB]  2331 Discussed with Dr. Roel Cluck [facility] hospitalist who will evaluate the patient for admission.   [MB]    Clinical Course User Index [MB] Hayden Rasmussen, MD   MDM Rules/Calculators/A&P                         This patient complains of chest pain; this involves an extensive number of treatment Options and is a complaint that carries with it a high risk  of complications and Morbidity. The differential includes ACS, PE, pneumonia, pneumothorax, vascular, musculoskeletal, reflux  I ordered, reviewed and interpreted  labs, which included CBC with normal white count normal hemoglobin, chemistries essentially normal other than mild elevation of glucose, troponin stable D-dimer elevated I ordered medication pain medication which patient declines, she had already received aspirin and nitro with minimal change in her symptoms.  GI cocktail without any change in her symptoms. I ordered imaging studies which included chest x-ray and CT angio chest and I independently    visualized and interpreted imaging which showed no acute findings Additional history obtained from patient's daughter Previous records obtained and reviewed in epic I consulted Dr. Roel Cluck [facility] hospitalist and discussed lab and imaging findings  Critical Interventions: None  After the interventions stated above, I reevaluated the patient and found patient to be hemodynamically stable.  She is still having symptomatic pain.  No serious etiologies have been found but she has risk factors that preclude her being safely discharged.  Final Clinical Impression(s) / ED Diagnoses Final diagnoses:  Chest pain    Rx / DC Orders ED Discharge Orders    None       Hayden Rasmussen, MD 05/24/20 1004

## 2020-05-23 NOTE — ED Notes (Signed)
Called CT to check where pt is in line. She is next.

## 2020-05-24 ENCOUNTER — Observation Stay (HOSPITAL_COMMUNITY): Payer: Medicare HMO

## 2020-05-24 ENCOUNTER — Observation Stay (HOSPITAL_BASED_OUTPATIENT_CLINIC_OR_DEPARTMENT_OTHER): Payer: Medicare HMO

## 2020-05-24 DIAGNOSIS — E785 Hyperlipidemia, unspecified: Secondary | ICD-10-CM | POA: Diagnosis not present

## 2020-05-24 DIAGNOSIS — I10 Essential (primary) hypertension: Secondary | ICD-10-CM | POA: Diagnosis not present

## 2020-05-24 DIAGNOSIS — R911 Solitary pulmonary nodule: Secondary | ICD-10-CM | POA: Diagnosis present

## 2020-05-24 DIAGNOSIS — E782 Mixed hyperlipidemia: Secondary | ICD-10-CM | POA: Diagnosis not present

## 2020-05-24 DIAGNOSIS — R079 Chest pain, unspecified: Secondary | ICD-10-CM

## 2020-05-24 LAB — CBC WITH DIFFERENTIAL/PLATELET
Abs Immature Granulocytes: 0.02 10*3/uL (ref 0.00–0.07)
Basophils Absolute: 0 10*3/uL (ref 0.0–0.1)
Basophils Relative: 0 %
Eosinophils Absolute: 0.1 10*3/uL (ref 0.0–0.5)
Eosinophils Relative: 2 %
HCT: 37.4 % (ref 36.0–46.0)
Hemoglobin: 12.3 g/dL (ref 12.0–15.0)
Immature Granulocytes: 0 %
Lymphocytes Relative: 35 %
Lymphs Abs: 2.2 10*3/uL (ref 0.7–4.0)
MCH: 30.8 pg (ref 26.0–34.0)
MCHC: 32.9 g/dL (ref 30.0–36.0)
MCV: 93.7 fL (ref 80.0–100.0)
Monocytes Absolute: 0.6 10*3/uL (ref 0.1–1.0)
Monocytes Relative: 9 %
Neutro Abs: 3.4 10*3/uL (ref 1.7–7.7)
Neutrophils Relative %: 54 %
Platelets: 239 10*3/uL (ref 150–400)
RBC: 3.99 MIL/uL (ref 3.87–5.11)
RDW: 13.9 % (ref 11.5–15.5)
WBC: 6.4 10*3/uL (ref 4.0–10.5)
nRBC: 0 % (ref 0.0–0.2)

## 2020-05-24 LAB — LIPID PANEL
Cholesterol: 209 mg/dL — ABNORMAL HIGH (ref 0–200)
HDL: 49 mg/dL (ref >40)
LDL Cholesterol: 128 mg/dL — ABNORMAL HIGH (ref 0–99)
Total CHOL/HDL Ratio: 4.3 RATIO
Triglycerides: 159 mg/dL — ABNORMAL HIGH (ref <150)
VLDL: 32 mg/dL (ref 0–40)

## 2020-05-24 LAB — URINALYSIS, ROUTINE W REFLEX MICROSCOPIC
Bilirubin Urine: NEGATIVE
Glucose, UA: NEGATIVE mg/dL
Ketones, ur: NEGATIVE mg/dL
Nitrite: NEGATIVE
Protein, ur: NEGATIVE mg/dL
Specific Gravity, Urine: 1.013 (ref 1.005–1.030)
WBC, UA: >50 WBC/hpf — ABNORMAL HIGH (ref 0–5)
pH: 5 (ref 5.0–8.0)

## 2020-05-24 LAB — NM MYOCAR MULTI W/SPECT W/WALL MOTION / EF
MPHR: 134 {beats}/min
Peak HR: 93 {beats}/min
Percent HR: 69 %
Rest HR: 70 {beats}/min

## 2020-05-24 LAB — PHOSPHORUS: Phosphorus: 2.9 mg/dL (ref 2.5–4.6)

## 2020-05-24 LAB — COMPREHENSIVE METABOLIC PANEL
ALT: 13 U/L (ref 0–44)
AST: 23 U/L (ref 15–41)
Albumin: 3.4 g/dL — ABNORMAL LOW (ref 3.5–5.0)
Alkaline Phosphatase: 90 U/L (ref 38–126)
Anion gap: 8 (ref 5–15)
BUN: 12 mg/dL (ref 8–23)
CO2: 27 mmol/L (ref 22–32)
Calcium: 9.3 mg/dL (ref 8.9–10.3)
Chloride: 102 mmol/L (ref 98–111)
Creatinine, Ser: 0.82 mg/dL (ref 0.44–1.00)
GFR calc Af Amer: >60 mL/min (ref >60)
GFR calc non Af Amer: >60 mL/min (ref >60)
Glucose, Bld: 122 mg/dL — ABNORMAL HIGH (ref 70–99)
Potassium: 3.8 mmol/L (ref 3.5–5.1)
Sodium: 137 mmol/L (ref 135–145)
Total Bilirubin: 0.5 mg/dL (ref 0.3–1.2)
Total Protein: 6.5 g/dL (ref 6.5–8.1)

## 2020-05-24 LAB — TSH: TSH: 5.61 u[IU]/mL — ABNORMAL HIGH (ref 0.350–4.500)

## 2020-05-24 LAB — MAGNESIUM: Magnesium: 1.8 mg/dL (ref 1.7–2.4)

## 2020-05-24 LAB — TROPONIN I (HIGH SENSITIVITY)
Troponin I (High Sensitivity): 7 ng/L (ref <18)
Troponin I (High Sensitivity): 10 ng/L (ref <18)

## 2020-05-24 LAB — T4, FREE: Free T4: 0.96 ng/dL (ref 0.61–1.12)

## 2020-05-24 LAB — SARS CORONAVIRUS 2 BY RT PCR (HOSPITAL ORDER, PERFORMED IN ~~LOC~~ HOSPITAL LAB): SARS Coronavirus 2: NEGATIVE

## 2020-05-24 MED ORDER — ONDANSETRON HCL 4 MG/2ML IJ SOLN
4.0000 mg | Freq: Four times a day (QID) | INTRAMUSCULAR | Status: DC | PRN
  Administered 2020-05-24: 4 mg via INTRAVENOUS
  Filled 2020-05-24: qty 2

## 2020-05-24 MED ORDER — ATORVASTATIN CALCIUM 40 MG PO TABS
40.0000 mg | ORAL_TABLET | Freq: Every day | ORAL | Status: DC
  Administered 2020-05-24 – 2020-05-25 (×2): 40 mg via ORAL
  Filled 2020-05-24 (×2): qty 1

## 2020-05-24 MED ORDER — ACETAMINOPHEN 650 MG RE SUPP: 650.0000 mg | Freq: Four times a day (QID) | RECTAL | Status: DC | PRN

## 2020-05-24 MED ORDER — POLYETHYLENE GLYCOL 3350 17 G PO PACK
17.0000 g | PACK | Freq: Every day | ORAL | Status: DC | PRN
  Administered 2020-05-24: 17 g via ORAL
  Filled 2020-05-24: qty 1

## 2020-05-24 MED ORDER — SODIUM CHLORIDE 0.9% FLUSH
3.0000 mL | Freq: Two times a day (BID) | INTRAVENOUS | Status: DC
  Administered 2020-05-24: 3 mL via INTRAVENOUS

## 2020-05-24 MED ORDER — ASPIRIN EC 81 MG PO TBEC
81.0000 mg | DELAYED_RELEASE_TABLET | Freq: Every day | ORAL | Status: DC
  Administered 2020-05-24 – 2020-05-25 (×2): 81 mg via ORAL
  Filled 2020-05-24 (×2): qty 1

## 2020-05-24 MED ORDER — TECHNETIUM TC 99M TETROFOSMIN IV KIT
10.0000 | PACK | Freq: Once | INTRAVENOUS | Status: AC | PRN
  Administered 2020-05-24: 10 via INTRAVENOUS

## 2020-05-24 MED ORDER — REGADENOSON 0.4 MG/5ML IV SOLN
INTRAVENOUS | Status: AC
  Administered 2020-05-24: 0.4 mg via INTRAVENOUS
  Filled 2020-05-24: qty 5

## 2020-05-24 MED ORDER — ACETAMINOPHEN 325 MG PO TABS: 650.0000 mg | ORAL_TABLET | Freq: Four times a day (QID) | ORAL | Status: DC | PRN

## 2020-05-24 MED ORDER — DOCUSATE SODIUM 100 MG PO CAPS
100.0000 mg | ORAL_CAPSULE | Freq: Two times a day (BID) | ORAL | Status: DC
  Administered 2020-05-24 – 2020-05-25 (×4): 100 mg via ORAL
  Filled 2020-05-24 (×4): qty 1

## 2020-05-24 MED ORDER — HYDROCHLOROTHIAZIDE 25 MG PO TABS
25.0000 mg | ORAL_TABLET | Freq: Every day | ORAL | Status: DC
  Administered 2020-05-24 – 2020-05-25 (×2): 25 mg via ORAL
  Filled 2020-05-24 (×2): qty 1

## 2020-05-24 MED ORDER — SODIUM CHLORIDE 0.9 % IV SOLN: INTRAVENOUS | Status: DC

## 2020-05-24 MED ORDER — ONDANSETRON HCL 4 MG PO TABS: 4.0000 mg | ORAL_TABLET | Freq: Four times a day (QID) | ORAL | Status: DC | PRN

## 2020-05-24 MED ORDER — ENOXAPARIN SODIUM 40 MG/0.4ML ~~LOC~~ SOLN
40.0000 mg | SUBCUTANEOUS | Status: DC
  Administered 2020-05-24: 40 mg via SUBCUTANEOUS
  Filled 2020-05-24 (×2): qty 0.4

## 2020-05-24 MED ORDER — TRAMADOL HCL 50 MG PO TABS: 50.0000 mg | ORAL_TABLET | Freq: Four times a day (QID) | ORAL | Status: DC | PRN

## 2020-05-24 MED ORDER — SODIUM CHLORIDE 0.9 % IV SOLN
1.0000 g | INTRAVENOUS | Status: DC
  Administered 2020-05-24: 1 g via INTRAVENOUS
  Filled 2020-05-24: qty 10

## 2020-05-24 MED ORDER — TECHNETIUM TC 99M TETROFOSMIN IV KIT
32.8000 | PACK | Freq: Once | INTRAVENOUS | Status: AC | PRN
  Administered 2020-05-24: 32.8 via INTRAVENOUS

## 2020-05-24 MED ORDER — HYDROCODONE-ACETAMINOPHEN 5-325 MG PO TABS: 1.0000 | ORAL_TABLET | ORAL | Status: DC | PRN

## 2020-05-24 MED ORDER — LOSARTAN POTASSIUM-HCTZ 100-25 MG PO TABS: 1.0000 | ORAL_TABLET | Freq: Every day | ORAL | Status: DC

## 2020-05-24 MED ORDER — PANTOPRAZOLE SODIUM 40 MG PO TBEC
40.0000 mg | DELAYED_RELEASE_TABLET | Freq: Every day | ORAL | Status: DC
  Administered 2020-05-25: 40 mg via ORAL
  Filled 2020-05-24: qty 1

## 2020-05-24 MED ORDER — REGADENOSON 0.4 MG/5ML IV SOLN
0.4000 mg | Freq: Once | INTRAVENOUS | Status: AC
  Filled 2020-05-24: qty 5

## 2020-05-24 MED ORDER — HYDRALAZINE HCL 20 MG/ML IJ SOLN
10.0000 mg | INTRAMUSCULAR | Status: DC | PRN
  Administered 2020-05-24 (×2): 10 mg via INTRAVENOUS
  Filled 2020-05-24 (×2): qty 1

## 2020-05-24 MED ORDER — BISACODYL 10 MG RE SUPP: 10.0000 mg | Freq: Every day | RECTAL | Status: DC | PRN

## 2020-05-24 MED ORDER — LOSARTAN POTASSIUM 50 MG PO TABS
100.0000 mg | ORAL_TABLET | Freq: Every day | ORAL | Status: DC
  Administered 2020-05-24 – 2020-05-25 (×2): 100 mg via ORAL
  Filled 2020-05-24 (×2): qty 2

## 2020-05-24 MED ORDER — TRAMADOL HCL 50 MG PO TABS
50.0000 mg | ORAL_TABLET | ORAL | Status: DC
  Administered 2020-05-24: 50 mg via ORAL
  Filled 2020-05-24: qty 1

## 2020-05-24 MED ORDER — GABAPENTIN 100 MG PO CAPS
100.0000 mg | ORAL_CAPSULE | Freq: Every day | ORAL | Status: DC
  Administered 2020-05-24 (×2): 100 mg via ORAL
  Filled 2020-05-24 (×2): qty 1

## 2020-05-24 NOTE — Care Management Obs Status (Signed)
[facility] NOTIFICATION   Patient Details  Name: KIEU QUIGGLE MRN: [id number] Date of Birth: [birth year]   Medicare Observation Status Notification Given:  Yes    Bethena Roys, RN 05/24/2020, 5:58 PM

## 2020-05-24 NOTE — Procedures (Signed)
Cannot do echo at this time since patient is getting a stress test, then moving to another unit.

## 2020-05-24 NOTE — Progress Notes (Addendum)
Following up nuc result - see Dr. Allison Quarry addendum. Low risk, he feels OK to be discharged from cardiology standpoint and does not think she needs inpatient echo. Tried to call into pt's room but line perpetually busy. Relayed result to attending and nurse to convey to patient.  F/u arranged in our office 07/02/20 - info placed on AVS. Dayna Dunn PA-C   [facility] will sign off.   Medication Recommendations:  Consider PRN Tylenol or NSAID for MSK chest pain  Other recommendations (labs, testing, etc):  none Follow up as an outpatient:  As noted above   Glenetta Hew, MD

## 2020-05-24 NOTE — Consult Note (Addendum)
Cardiology Consultation:   Patient ID: [name]; [id number]; 18-Oct-1931   Admit date: 05/23/2020 Date of Consult: 05/24/2020  Primary Care Provider: Cyndi Bender, PA-C Primary Cardiologist: Dr. Marlou Hawkins (last seen 2018)  Patient Profile:   [name] is a 86 y.o. female with a hx of remote post-operative atrial fibrillation (2010), CKD Stage III, HTN, and HLD who is being seen today for the evaluation of chest pain at the request of Dr. Roel Hawkins.   History of Present Illness:   [name] is an 86 year old female with a history stated above who presented to [facility] on 05/24/2020 with chest pain. Patient was initially seen by Dr. Marlou Hawkins 02/23/2017 for the evaluation of shortness of breath with minimal exertion, chest tightness with left shoulder involvement.  At that time, patient reported a remote history of atrial fibrillation in 2010 in the postoperative setting.  She underwent a nuclear stress test prior to knee surgery which was found to be normal per patient report.  EKG showed NSR with poor R wave progression, downsloping ST segments in leads III, aVF, V5 and V6 concerning for possible inferior lateral ischemia. Plan at that time was to proceed with an echocardiogram to assess for LV function and valvular abnormalities along with NST. Stress test showed no ST segment deviation during stress with a small defect of mild severity present in apex location felt to be small apical thinning artifact with otherwise normal perfusion.  LV with normal regional global systolic function. Echocardiogram showed an LVEF at 65 to 70% with no regional wall motion abnormalities, G1 DD, mild TR and peak PA pressure of 40 mmHg.  She did have carotid artery duplex studies performed 35/32/9924 which showed LICA/RICA at 1 to 26% bilaterally.   On 05/24/20, chest pain began approximately 11 AM. Pt states she was walking through the house preparing to eat lunch when chest pain started under her left breast. CP  persisted despite rest. CP was felt as a pressure without radiation. Eating lunch did not relieve or worsen the pain. The pain was constant and was associated with shortness of breath, but no diaphoresis or nausea/vomiting. Pain was worse with deep inspiration and cough. No recent illness, no sick contacts, COVID vaccination completed in early March. On their arrival she was given ASA 324mg  and 2 SL NTG tablets with minimal relief. hsT found to be 5>>6>>7>>10. EKG with NSR and no acute ischemic changes. Previous EKG from initial cardiology consultation in 2018 with inferior and lateral TWI have now resolved. CXR with no active disease. CTA with no PE however with moderate aortic atherosclerosis and coronary vascular calcification. Pt was admitted to hospitalist service and ordered a nuclear stress test. Cardiology was consulted prior to results of stress test.   She continued to have chest pain throughout the night until she received tylenol at 3AM this morning. CP subsided and has not recurred.   Past Medical History:  Diagnosis Date  . Chronic renal insufficiency, stage III (moderate)   . DDD (degenerative disc disease), lumbar   . Diverticulosis   . Dizziness   . GERD (gastroesophageal reflux disease)   . Herpes simplex infection   . History of anemia   . History of atrial fibrillation   . HTN (hypertension)   . Mixed hyperlipidemia   . Osteoarthritis   . Osteopenia   . Prediabetes   . SOB (shortness of breath) on exertion   . Spinal stenosis of lumbar region at multiple levels  No past surgical history on file.   Prior to Admission medications   Medication Sig Start Date End Date Taking? Authorizing Provider  Cholecalciferol (VITAMIN D3) 1000 units CAPS Take 1,000 Units by mouth daily.    Yes [provider]  fluticasone (FLONASE) 50 MCG/ACT nasal spray Place 1-2 sprays into both nostrils daily as needed for allergies or rhinitis.  02/17/17  Yes [provider]    gabapentin (NEURONTIN) 100 MG capsule Take 100 mg by mouth at bedtime.   Yes [provider]  losartan-hydrochlorothiazide (HYZAAR) 100-25 MG tablet Take 1 tablet by mouth daily.   Yes [provider]  Multiple Vitamins-Minerals (PRESERVISION AREDS PO) Take 2 capsules by mouth at bedtime.    Yes [provider]  naproxen sodium (ALEVE) 220 MG tablet Take 220-440 mg by mouth 2 (two) times daily as needed (for headaches or pain).   Yes [provider]  omeprazole (PRILOSEC OTC) 20 MG tablet Take 20 mg by mouth daily before breakfast.   Yes [provider]  traMADol (ULTRAM) 50 MG tablet Take 50-100 mg by mouth See admin instructions. Take 50-100 mg by mouth every six to eight hours as needed for pain 05/14/17  Yes [name], Max T, DPM  valACYclovir (VALTREX) 1000 MG tablet Take 1,000 mg by mouth every 12 (twelve) hours as needed (for fever blisters and no more than 4 tablets total per episode).    Yes [provider]  vitamin E 400 UNIT capsule Take 400 Units by mouth daily.   Yes [provider]  aspirin EC 81 MG tablet Take 81 mg by mouth daily. Patient not taking: Reported on 05/23/2020    [provider]  doxycycline (VIBRAMYCIN) 100 MG capsule Take 1 capsule (100 mg total) by mouth 2 (two) times daily. Patient not taking: Reported on 05/23/2020 05/27/17   Trula Slade, DPM  ondansetron (ZOFRAN) 4 MG tablet Take 1 tablet (4 mg total) by mouth every 8 (eight) hours as needed. Patient not taking: Reported on 05/23/2020 05/12/17   Garrel Ridgel DPM  VENTOLIN HFA 108 [phone]) MCG/ACT inhaler  04/29/17   [provider]    Inpatient Medications: Scheduled Meds: . aspirin EC  81 mg Oral Daily  . docusate sodium  100 mg Oral BID  . enoxaparin (LOVENOX) injection  40 mg Subcutaneous Q24H  . fentaNYL (SUBLIMAZE) injection  50 mcg Intravenous Once  . gabapentin  100 mg Oral QHS  . losartan  100 mg Oral Daily   And  .  hydrochlorothiazide  25 mg Oral Daily  . pantoprazole  40 mg Oral QAC breakfast  . sodium chloride flush  3 mL Intravenous Q12H  . traMADol  50-100 mg Oral See admin instructions   Continuous Infusions:  PRN Meds: acetaminophen **OR** acetaminophen, bisacodyl, HYDROcodone-acetaminophen, ondansetron **OR** ondansetron (ZOFRAN) IV, polyethylene glycol  Allergies:    Allergies  Allergen Reactions  . Adhesive [Tape] Other (See Comments)    PEELS OFF THE SKIN!!  . Clindamycin/Lincomycin Nausea And Vomiting  . Codeine Nausea Only  . Dilaudid [Hydromorphone Hcl] Nausea And Vomiting  . Fenofibrate Other (See Comments)    Reaction not recalled  . Morphine And Related Nausea And Vomiting  . Sulfa Antibiotics Hives  . Sulfur Hives and Other (See Comments)    Should this be "Sulfa" (??)    Social History:   Social History   Socioeconomic History  . Marital status: Widowed    Spouse name: Not on file  .  Number of children: Not on file  . Years of education: Not on file  . Highest education level: Not on file  Occupational History  . Not on file  Tobacco Use  . Smoking status: Never Smoker  . Smokeless tobacco: Never Used  Substance and Sexual Activity  . Alcohol use: No  . Drug use: No  . Sexual activity: Not on file  Other Topics Concern  . Not on file  Social History Narrative  . Not on file   Social Determinants of Health   Financial Resource Strain:   . Difficulty of Paying Living Expenses:   Food Insecurity:   . Worried About Charity fundraiser in the Last Year:   . Arboriculturist in the Last Year:   Transportation Needs:   . Film/video editor (Medical):   Marland Kitchen Lack of Transportation (Non-Medical):   Physical Activity:   . Days of Exercise per Week:   . Minutes of Exercise per Session:   Stress:   . Feeling of Stress :   Social Connections:   . Frequency of Communication with Friends and Family:   . Frequency of Social Gatherings with Friends and Family:    . Attends Religious Services:   . Active Member of Clubs or Organizations:   . Attends Archivist Meetings:   Marland Kitchen Marital Status:   Intimate Partner Violence:   . Fear of Current or Ex-Partner:   . Emotionally Abused:   Marland Kitchen Physically Abused:   . Sexually Abused:     Family History:   Family History  Problem Relation Age of Onset  . Heart attack Mother   . Diabetes Mother   . Heart attack Father   . Other Father        aminolysis of kidney   Family Status:  Family Status  Relation Name Status  . Mother  Deceased  . Father  Deceased       also had black lung    ROS:  Please see the history of present illness.  All other ROS reviewed and negative.     Physical Exam/Data:   Vitals:   05/24/20 1306 05/24/20 1322 05/24/20 1324 05/24/20 1326  BP: (!) 179/76 (!) 153/74 (!) 166/74 (!) 176/71  Pulse:      Resp:      Temp:      TempSrc:      SpO2:      Weight:      Height:       No intake or output data in the 24 hours ending 05/24/20 1340 Filed Weights   05/23/20 1651  Weight: 93 kg   Body mass index is 32.11 kg/m.  General:  Well nourished, well developed, in no acute distress HEENT: normal Neck: no JVD Vascular: No carotid bruits; 4/4 extremity pulses 2+, without bruits  Cardiac:  normal S1, S2; RRR; no murmur  Lungs:  clear to auscultation bilaterally, no wheezing, rhonchi or rales  Abd: soft, nontender, no hepatomegaly  Ext: no edema Musculoskeletal:  No deformities, BUE and BLE strength normal and equal Skin: warm and dry  Neuro:  CNs 2-12 intact, no focal abnormalities noted Psych:  Normal affect   EKG:  The EKG was personally reviewed and demonstrates:  Sinus rhythm HR 71, ST elevation in anterior leads Telemetry:  Telemetry was personally reviewed and demonstrates:  Sinus rhythm during nuclear stress test   Relevant CV Studies:  Lexiscan stress test 03/02/2017:   Nuclear stress EF: 63%.  There was no ST segment deviation noted during  stress.  Defect 1: There is a small defect of mild severity present in the apex location.  The study is normal.  This is a low risk study.   Low risk stress nuclear study with a small apical thinning artifact, otherwise normal perfusion. Normal left ventricular regional and global systolic function.   Echocardiogram 03/09/2017:  - Left ventricle: The cavity size was normal. Wall thickness was  increased in a pattern of mild LVH. Systolic function was  vigorous. The estimated ejection fraction was in the range of 65%  to 70%. Wall motion was normal; there were no regional wall  motion abnormalities. Doppler parameters are consistent with  abnormal left ventricular relaxation (grade 1 diastolic  dysfunction). The E/e&' ratio is <8, suggesting normal LV filling  pressure.  - Left atrium: The atrium was normal in size.  - Tricuspid valve: There was mild regurgitation.  - Pulmonary arteries: PA peak pressure: 40 mm Hg (S).  - Inferior vena cava: The vessel was normal in size. The  respirophasic diameter changes were in the normal range (>= 50%),  consistent with normal central venous pressure.   Laboratory Data:  Chemistry Recent Labs  Lab 05/23/20 1745 05/24/20 0316  NA 136 137  K 4.3 3.8  CL 100 102  CO2 30 27  GLUCOSE 126* 122*  BUN 19 12  CREATININE 0.84 0.82  CALCIUM 9.2 9.3  GFRNONAA >60 >60  GFRAA >60 >60  ANIONGAP 6 8    Total Protein  Date Value Ref Range Status  05/24/2020 6.5 6.5 - 8.1 g/dL Final   Albumin  Date Value Ref Range Status  05/24/2020 3.4 (L) 3.5 - 5.0 g/dL Final   AST  Date Value Ref Range Status  05/24/2020 23 15 - 41 U/L Final   ALT  Date Value Ref Range Status  05/24/2020 13 0 - 44 U/L Final   Alkaline Phosphatase  Date Value Ref Range Status  05/24/2020 90 38 - 126 U/L Final   Total Bilirubin  Date Value Ref Range Status  05/24/2020 0.5 0.3 - 1.2 mg/dL Final   Hematology Recent Labs  Lab 05/23/20 1745  05/24/20 0316  WBC 6.6 6.4  RBC 3.88 3.99  HGB 12.4 12.3  HCT 37.2 37.4  MCV 95.9 93.7  MCH 32.0 30.8  MCHC 33.3 32.9  RDW 14.1 13.9  PLT 258 239   Cardiac EnzymesNo results for input(s): TROPONINI in the last 168 hours. No results for input(s): TROPIPOC in the last 168 hours.  BNPNo results for input(s): BNP, PROBNP in the last 168 hours.  DDimer  Recent Labs  Lab 05/23/20 1922  DDIMER 0.98*   TSH:  Lab Results  Component Value Date   TSH 5.610 (H) 05/24/2020   Lipids: Lab Results  Component Value Date   CHOL 209 (H) 05/24/2020   HDL 49 05/24/2020   LDLCALC 128 (H) 05/24/2020   TRIG 159 (H) 05/24/2020   CHOLHDL 4.3 05/24/2020   HgbA1c:No results found for: HGBA1C  Radiology/Studies:  DG Chest 1 View  Result Date: 05/23/2020 CLINICAL DATA:  Chest pain. EXAM: CHEST  1 VIEW COMPARISON:  Chest x-ray dated Apr 29, 2017. FINDINGS: The heart size and mediastinal contours are within normal limits. Both lungs are clear. The visualized skeletal structures are unremarkable. IMPRESSION: No active disease. Electronically Signed   By: Titus Dubin M.D.   On: 05/23/2020 17:45   CT Angio Chest PE W/Cm &/Or Wo Cm  Result Date: 05/23/2020 CLINICAL DATA:  Shortness of breath EXAM: CT ANGIOGRAPHY CHEST WITH CONTRAST TECHNIQUE: Multidetector CT imaging of the chest was performed using the standard protocol during bolus administration of intravenous contrast. Multiplanar CT image reconstructions and MIPs were obtained to evaluate the vascular anatomy. CONTRAST:  156mL OMNIPAQUE IOHEXOL 350 MG/ML SOLN COMPARISON:  Chest x-ray 05/23/2020, CT 04/09/2006 FINDINGS: Cardiovascular: Satisfactory opacification of the pulmonary arteries to the segmental level. No evidence of pulmonary embolism. Nonaneurysmal aorta. Moderate aortic atherosclerosis. Coronary vascular calcification. Normal heart size. No pericardial effusion Mediastinum/Nodes: Midline trachea. No suspicious thyroid mass. No significant  mediastinal adenopathy. Esophagus shows small hiatal hernia Lungs/Pleura: No consolidation or pleural effusion. Patchy perihilar lung densities possible atelectasis or small airways disease. Multiple bilateral pulmonary nodules, for example 4 mm left upper lobe lung nodule, series 6 image number 26. 6 mm right middle lobe lung nodule, mildly increased in size, series 6, image number 45. Multiple additional punctate nodules in the right upper lobe and left lower lobe. Upper Abdomen: Colon diverticular disease.  No acute abnormality Musculoskeletal: No chest wall abnormality. No acute or significant osseous findings. Probable hemangioma in the T7 vertebral body. Review of the MIP images confirms the above findings. IMPRESSION: 1. Negative for acute pulmonary embolus. No acute pulmonary airspace disease. 2. Multiple bilateral lung nodules measuring up to 6 mm in size. Non-contrast chest CT at 3-6 months is recommended. If the nodules are stable at time of repeat CT, then future CT at 18-24 months (from today's scan) is considered optional for low-risk patients, but is recommended for high-risk patients. This recommendation follows the consensus statement: Guidelines for Management of Incidental Pulmonary Nodules Detected on CT Images: From the Fleischner Society 2017; Radiology 2017; 284:228-243. Aortic Atherosclerosis (ICD10-I70.0). Electronically Signed   By: Donavan Foil M.D.   On: 05/23/2020 20:58    Assessment and Plan:   Chest pain - Pt presented with chest pain with typical and atypical features: sharp pain under her left breast associated with SOB, worse with cough and deep inspiration - hs troponin 5 --> 6 --> 7 --> 10 - EKG with St elevation in anterior leads, appears more pronounced compared to tracings in 2018 - previous nuclear stress test in 2018 negative for ischemia - pt was admitted to hospitialist service and nuclear stress test was ordered - pt reports relief of chest pain with tylenol at  3AM this morning - will review nuclear stress images - if normal, may discharge with cardiology follow up - if abnormal, will explore options for angiography - given calcification seen on CT, may consider CT coronary for future chest pain - echo pending - if nuc normal, do not feel strongly to keep her here for an echo   Aortic atherosclerosis - seen in CTA - recommend risk factor modification with statin   Essential hypertension - hypertensive today, has not received home medications yet - continue home llosartan/HCTZ 100/25 -pending results of stress test, may make recommendations for additional medications.   Carotid artery disease: - Carotid doppler studies from 08/2019 with bilateral 7-06% LICA/RICA stenosis - Continue ASA, start statin    HLD 05/24/2020: Cholesterol 209; HDL 49; LDL Cholesterol 128; Triglycerides 159; VLDL 32 - not currently on statin - given aortic atherosclerosis on CT could consider LDL goal  less than 70 - could consider starting 20 mg crestor - will defer to PCP and primary cardiologist   Lung nodules - multiple bilateral lung nodules up to 84mm in size - recommend noncontrasted  CT in 3-6 months - if stable, then repeat scans 18-24 months - will need to follow with PCP   Family history of factor V Leiden deficiency - continue daily ASA - CTA negative for PE   Pending nuclear stress images.   For questions or updates, please contact [place] Please consult [email or web address] for contact info under Cardiology/STEMI.   SignedKathyrn Drown NP-C [facility] Pager: [phone] 05/24/2020 1:40 PM   ATTENDING ATTESTATION  I have seen, examined and evaluated the patient this PM in Nuc Med along with Fabian Sharp, PA-C.  After reviewing all the available data and chart, we discussed the patients laboratory, study & physical findings as well as symptoms in detail. I agree with her findings, examination as well as impression recommendations as  per our discussion.    Attending adjustments noted in italics.   86 year old woman with left lateral chest pain that is somewhat reproducible on exam.  Negative.  PE.  No evidence of pneumonia.  Prolonged, persistent discomfort for several hours with minimal troponin if any-argue strongly against ACS.  Most likely this is musculoskeletal, however she is currently undergoing a nuclear stress test for ischemic evaluation.  We were consulted prior to the stress test being done.  More data to follow after stress test is completed.  If stress test is not consistent with occlusive CAD, would consider noncardiac/musculoskeletal etiology.  Given her age and risk factors, there is a least moderate risk for this being "cardiac in nature.  Stress test ordered already.  Not unreasonable given coronary calcification to consider statin. She is hypertensive here, likely because she has not received her home BP medications.  Lung nodule seen on CT scan.  Will defer to primary service to discuss findings.  May need to follow-up with PCP for details.   Glenetta Hew, M.D., M.S. Interventional Cardiologist   Pager # [phone] Phone # [phone] [address] [place], [place] [address]   ADDENDUM:   I discussed with the reading physician, the apical/septal defect is fixed but with normal wall motion which would argue artifact as opposed to prior infarct.  There is no evidence of ischemia, therefore not suggest ischemic chest pain.  Okay for discharge home.  Does not need  inpatient echocardiogram based on the results Myoview result  Narrative & Impression    There was no ST segment deviation noted during stress.  Defect 1: There is a small defect of severe severity present in the apex location.  This is a low risk study.  The left ventricular ejection fraction is normal (55-65%).  Nuclear stress EF: 59%.   Low risk, mildly abnormal stress nuclear study with apical thinning versus  small prior infarct.  No ischemia.  Gated ejection fraction 59% with normal wall motion.     Glenetta Hew, MD

## 2020-05-24 NOTE — ED Notes (Signed)
Pt transported to Nuclear Medicine  

## 2020-05-24 NOTE — Progress Notes (Signed)
PROGRESS NOTE    Patient: [name]                            PCP: Cyndi Bender, PA-C                    DOB: September 12, 1931            DOA: 05/23/2020 [id number]:[id number]             DOS: 05/24/2020, 1:33 PM   LOS: 0 days   Date of Service: The patient was seen and examined on 05/24/2020  Subjective:   The patient was seen and examined this Am. Stable, not complaining of any chest pain or shortness of breath Remains stable hemodynamically's morning N.p.o. in anticipation of nuclear cardiac stress test   Brief Narrative:   [name] is a 86 y.o. female with medical history significant of atrial fibrillation and hypertension, CKD stage III, GERD, HLD, prediabetes. Patient presented with chest pain reported pain as sharp 7 out of 10 pain started during regular activity but somewhat worse with activity no alleviation.  Patient denies history of CAD EMS was called patient was given 324 mg of aspirin and 2 sublingual nitro and seem to have helped vital signs were stable.     Has been vaccinated against COVID FEbruary   Initial COVID TEST  - NEGATIVE    Assessment & Plan:   Active Problems:   Essential hypertension   Chest pain   Pulmonary nodule   HLD (hyperlipidemia)  Present on Admission: . Chest pain - - H=   2   ,E=0  ,A=  2 , R  2 , T 0 ,  for the  Total of 6  therefore will admit for observation and further evaluation ( Risk of MACE: Scores 0-3  of 0.9-1.7%.,  4-6: 12-16.6% , Scores ?7: 50-65% ) - CT A negative for significant PE   - troponin unremarkable 6, 7, 10, LDL 128, -No acute ischemic changes on ECG    - monitor on telemetry, cycle cardiac enzymes, obtain serial ECG and  ECHO in AM.   - Daily aspirin -gA1C, obtain TSH.  Make sure patient is on Aspirin, statins, -Patient scheduled for NM myocardial perfusion stress test today -Consulted cardiology for evaluation  . Essential hypertension -stable continue home medication  . Pulmonary nodule -patient  will need outpatient follow-up regarding   . HLD (hyperlipidemia) chronic not on statin will check lipid panel  Constipation - bowel regimen  Abnormal UA -  Following with urine cultures -Initiating empiric antibiotics  Hx of urinary retention - will order bladder scan Hx of renal mass removal with now hematuria - would benefit from additional follow urology denies dysuria   Nutritional status:         Cultures; Urine Culture  >>>   Antimicrobials: 6/25 Empiric IV antibiotic Rocephin    Consultants: Cardiology   ------------------------------------------------------------------------------------------------------------------------------------------------  DVT prophylaxis:  @VTEPROPHYLAXISORDERS2 @  SCD/Compression stockings and Lovenox SQ Code Status:   Code Status: Full Code Family Communication: No family member present at bedside- attempt will be made to update daily The above findings and plan of care has been discussed with patient (and family )  in detail,  they expressed understanding and agreement of above. -Advance care planning has been discussed.   Admission status:    Status is: Observation  The patient remains OBS appropriate and will d/c before 2 midnights.  Dispo: The patient is from: Home              Anticipated d/c is to: Home              Anticipated d/c date is: 1 day              Patient currently is not medically stable to d/c.  Pending completing an Cardiac stress test, cardiac evaluation        Procedures:    NM myocardial perfusion stress test  Antimicrobials:  Anti-infectives (From admission, onward)   None       Medication:  . aspirin EC  81 mg Oral Daily  . docusate sodium  100 mg Oral BID  . enoxaparin (LOVENOX) injection  40 mg Subcutaneous Q24H  . fentaNYL (SUBLIMAZE) injection  50 mcg Intravenous Once  . gabapentin  100 mg Oral QHS  . losartan  100 mg Oral Daily   And  . hydrochlorothiazide  25 mg Oral  Daily  . pantoprazole  40 mg Oral QAC breakfast  . sodium chloride flush  3 mL Intravenous Q12H  . traMADol  50-100 mg Oral See admin instructions    acetaminophen **OR** acetaminophen, bisacodyl, HYDROcodone-acetaminophen, ondansetron **OR** ondansetron (ZOFRAN) IV, polyethylene glycol   Objective:   Vitals:   05/24/20 1306 05/24/20 1322 05/24/20 1324 05/24/20 1326  BP: (!) 179/76 (!) 153/74 (!) 166/74 (!) 176/71  Pulse:      Resp:      Temp:      TempSrc:      SpO2:      Weight:      Height:       No intake or output data in the 24 hours ending 05/24/20 1333 Filed Weights   05/23/20 1651  Weight: 93 kg     Examination:   Physical Exam  Constitution:  Alert, cooperative, no distress,  Appears calm and comfortable  Psychiatric: Normal and stable mood and affect, cognition intact,   HEENT: Normocephalic, PERRL, otherwise with in Normal limits  Chest:Chest symmetric Cardio vascular:  S1/S2, RRR, No murmure, No Rubs or Gallops  pulmonary: Clear to auscultation bilaterally, respirations unlabored, negative wheezes / crackles Abdomen: Soft, non-tender, non-distended, bowel sounds,no masses, no organomegaly Muscular skeletal: Limited exam - in bed, able to move all 4 extremities, Normal strength,  Neuro: CNII-XII intact. , normal motor and sensation, reflexes intact  Extremities: No pitting edema lower extremities, +2 pulses  Skin: Dry, warm to touch, negative for any Rashes, No open wounds Wounds: per nursing documentation    ------------------------------------------------------------------------------------------------------------------------------------------    LABs:  CBC Latest Ref Rng & Units 05/24/2020 05/23/2020 01/04/2009  WBC 4.0 - 10.5 K/uL 6.4 6.6 -  Hemoglobin 12.0 - 15.0 g/dL 12.3 12.4 10.9(L)  Hematocrit 36 - 46 % 37.4 37.2 32.1(L)  Platelets 150 - 400 K/uL 239 258 -   CMP Latest Ref Rng & Units 05/24/2020 05/23/2020 01/04/2009  Glucose 70 - 99 mg/dL 122(H)  126(H) 116(H)  BUN 8 - 23 mg/dL 12 19 10   Creatinine 0.44 - 1.00 mg/dL 0.82 0.84 0.98  Sodium 135 - 145 mmol/L 137 136 136  Potassium 3.5 - 5.1 mmol/L 3.8 4.3 3.7  Chloride 98 - 111 mmol/L 102 100 103  CO2 22 - 32 mmol/L 27 30 29   Calcium 8.9 - 10.3 mg/dL 9.3 9.2 7.8(L)  Total Protein 6.5 - 8.1 g/dL 6.5 - -  Total Bilirubin 0.3 - 1.2 mg/dL 0.5 - -  Alkaline Phos 38 - 126  U/L 90 - -  AST 15 - 41 U/L 23 - -  ALT 0 - 44 U/L 13 - -       Micro Results Recent Results (from the past 240 hour(s))  SARS Coronavirus 2 by RT PCR (hospital order, performed in [facility] hospital lab) Nasopharyngeal Nasopharyngeal Swab     Status: None   Collection Time: 05/23/20 10:55 PM   Specimen: Nasopharyngeal Swab  Result Value Ref Range Status   SARS Coronavirus 2 NEGATIVE NEGATIVE Final    Comment: (NOTE) SARS-CoV-2 target nucleic acids are NOT DETECTED.  The SARS-CoV-2 RNA is generally detectable in upper and lower respiratory specimens during the acute phase of infection. The lowest concentration of SARS-CoV-2 viral copies this assay can detect is 250 copies / mL. A negative result does not preclude SARS-CoV-2 infection and should not be used as the sole basis for treatment or other patient management decisions.  A negative result may occur with improper specimen collection / handling, submission of specimen other than nasopharyngeal swab, presence of viral mutation(s) within the areas targeted by this assay, and inadequate number of viral copies (<250 copies / mL). A negative result must be combined with clinical observations, patient history, and epidemiological information.  Fact Sheet for Patients:   StrictlyIdeas.no  Fact Sheet for Healthcare Providers: BankingDealers.co.za  This test is not yet approved or  cleared by the [place] FDA and has been authorized for detection and/or diagnosis of SARS-CoV-2 by FDA under an Emergency  Use Authorization (EUA).  This EUA will remain in effect (meaning this test can be used) for the duration of the COVID-19 declaration under Section 564(b)(1) of the Act, 21 U.S.C. section 360bbb-3(b)(1), unless the authorization is terminated or revoked sooner.  Performed at [facility], [place] [address] [place], [place] [address]     Radiology Reports DG Chest 1 View  Result Date: 05/23/2020 CLINICAL DATA:  Chest pain. EXAM: CHEST  1 VIEW COMPARISON:  Chest x-ray dated Apr 29, 2017. FINDINGS: The heart size and mediastinal contours are within normal limits. Both lungs are clear. The visualized skeletal structures are unremarkable. IMPRESSION: No active disease. Electronically Signed   By: Titus Dubin M.D.   On: 05/23/2020 17:45   CT Angio Chest PE W/Cm &/Or Wo Cm  Result Date: 05/23/2020 CLINICAL DATA:  Shortness of breath EXAM: CT ANGIOGRAPHY CHEST WITH CONTRAST TECHNIQUE: Multidetector CT imaging of the chest was performed using the standard protocol during bolus administration of intravenous contrast. Multiplanar CT image reconstructions and MIPs were obtained to evaluate the vascular anatomy. CONTRAST:  164mL OMNIPAQUE IOHEXOL 350 MG/ML SOLN COMPARISON:  Chest x-ray 05/23/2020, CT 04/09/2006 FINDINGS: Cardiovascular: Satisfactory opacification of the pulmonary arteries to the segmental level. No evidence of pulmonary embolism. Nonaneurysmal aorta. Moderate aortic atherosclerosis. Coronary vascular calcification. Normal heart size. No pericardial effusion Mediastinum/Nodes: Midline trachea. No suspicious thyroid mass. No significant mediastinal adenopathy. Esophagus shows small hiatal hernia Lungs/Pleura: No consolidation or pleural effusion. Patchy perihilar lung densities possible atelectasis or small airways disease. Multiple bilateral pulmonary nodules, for example 4 mm left upper lobe lung nodule, series 6 image number 26. 6 mm right middle lobe lung nodule, mildly increased in  size, series 6, image number 45. Multiple additional punctate nodules in the right upper lobe and left lower lobe. Upper Abdomen: Colon diverticular disease.  No acute abnormality Musculoskeletal: No chest wall abnormality. No acute or significant osseous findings. Probable hemangioma in the T7 vertebral body. Review of the MIP images  confirms the above findings. IMPRESSION: 1. Negative for acute pulmonary embolus. No acute pulmonary airspace disease. 2. Multiple bilateral lung nodules measuring up to 6 mm in size. Non-contrast chest CT at 3-6 months is recommended. If the nodules are stable at time of repeat CT, then future CT at 18-24 months (from today's scan) is considered optional for low-risk patients, but is recommended for high-risk patients. This recommendation follows the consensus statement: Guidelines for Management of Incidental Pulmonary Nodules Detected on CT Images: From the Fleischner Society 2017; Radiology 2017; 284:228-243. Aortic Atherosclerosis (ICD10-I70.0). Electronically Signed   By: Donavan Foil M.D.   On: 05/23/2020 20:58    SIGNED: Deatra James, MD, FACP, FHM. [facility],  Pager (please use [email or web address] to page/text)  If 7PM-7AM, please contact night-coverage [email or web address] Ota [facility] 05/24/2020, 1:33 PM

## 2020-05-24 NOTE — ED Notes (Signed)
Attempted report x 2 

## 2020-05-24 NOTE — ED Notes (Signed)
Attempted report x1. 

## 2020-05-24 NOTE — Progress Notes (Signed)
   [name] presented for a nuclear stress test today.  No immediate complications.  Stress imaging is pending at this time.  Preliminary EKG findings may be listed in the chart, but the stress test result will not be finalized until perfusion imaging is complete.  Tami Lin Azarias Chiou, PA-C 05/24/2020, 1:46 PM

## 2020-05-25 DIAGNOSIS — R079 Chest pain, unspecified: Secondary | ICD-10-CM | POA: Diagnosis not present

## 2020-05-25 DIAGNOSIS — I10 Essential (primary) hypertension: Secondary | ICD-10-CM | POA: Diagnosis not present

## 2020-05-25 LAB — URINE CULTURE: Culture: 70000 — AB

## 2020-05-26 LAB — T3, FREE: T3, Free: 2.7 pg/mL (ref 2.0–4.4)

## 2020-05-27 DIAGNOSIS — Z79899 Other long term (current) drug therapy: Secondary | ICD-10-CM | POA: Diagnosis not present

## 2020-05-27 DIAGNOSIS — M48061 Spinal stenosis, lumbar region without neurogenic claudication: Secondary | ICD-10-CM | POA: Diagnosis not present

## 2020-05-27 DIAGNOSIS — Z7689 Persons encountering health services in other specified circumstances: Secondary | ICD-10-CM | POA: Diagnosis not present

## 2020-05-27 DIAGNOSIS — R918 Other nonspecific abnormal finding of lung field: Secondary | ICD-10-CM | POA: Diagnosis not present

## 2020-05-27 DIAGNOSIS — R0789 Other chest pain: Secondary | ICD-10-CM | POA: Diagnosis not present

## 2020-05-27 DIAGNOSIS — R8281 Pyuria: Secondary | ICD-10-CM | POA: Diagnosis not present

## 2020-05-27 DIAGNOSIS — Z6833 Body mass index (BMI) 33.0-33.9, adult: Secondary | ICD-10-CM | POA: Diagnosis not present

## 2020-05-27 DIAGNOSIS — K5900 Constipation, unspecified: Secondary | ICD-10-CM | POA: Diagnosis not present

## 2020-05-27 NOTE — Discharge Summary (Signed)
Physician Discharge Summary  [name] KDX:[id number] DOB: Aug 23, 1931 DOA: 05/23/2020  PCP: Cyndi Bender, PA-C  Admit date: 05/23/2020 Discharge date: 05/25/2020  Time spent: 35 minutes  Recommendations for Outpatient Follow-up:  1. Follow-up with [facility] heart care in 2 weeks 2. PCP in 1 week 3. Hematuria, follow-up with urology 4. Pulmonary nodules noted on imaging needs outpatient follow-up and repeat imaging down the road   Discharge Diagnoses:  Principal Problem:   Chest pain Active Problems:   Essential hypertension   Pulmonary nodule   HLD (hyperlipidemia)   Discharge Condition: Stable  Diet recommendation: Heart healthy  Filed Weights   05/23/20 1651 05/24/20 1732  Weight: 93 kg 94.8 kg    History of present illness:  [name] a 86 y.o.femalewith medical history significant of atrial fibrillation and hypertension, CKDstage III, GERD, HLD, prediabetes. Patient presented with chest pain reported pain as sharp 7 out of 10 pain started during regular activity but somewhat worse with activity no alleviation  Hospital Course:   [name] was admitted with atypical chest pain,  she was admitted overnight, ruled out for ACS, high-sensitivity troponins were negative, CT angiogram was negative for PE, due to cardiac risk factors she was seen by cardiology in consultation and underwent nuclear medicine stress test which was negative for inducible ischemia she was felt to be stable from a cardiac standpoint for discharge home today with outpatient follow-up  She was also noted to have a pulmonary nodule which will need follow-up imaging and has chronic/subacute hematuria for which she is due to follow-up with urology  Procedures:  Myoview  Consultations:  Cardiology  Discharge Exam: Vitals:   05/25/20 0028 05/25/20 0519  BP: (!) 120/53 (!) 103/49  Pulse: 88 76  Resp: 18 16  Temp: 98.5 F (36.9 C)   SpO2: 92% 93%    General:  AAOx3 Cardiovascular: S1S2/RRR Respiratory: CTAB  Discharge Instructions   Discharge Instructions    Diet - low sodium heart healthy   Complete by: As directed    Increase activity slowly   Complete by: As directed      Allergies as of 05/25/2020      Reactions   Adhesive [tape] Other (See Comments)   PEELS OFF THE SKIN!!   Clindamycin/lincomycin Nausea And Vomiting   Codeine Nausea Only   Dilaudid [hydromorphone Hcl] Nausea And Vomiting   Fenofibrate Other (See Comments)   Reaction not recalled   Morphine And Related Nausea And Vomiting   Sulfa Antibiotics Hives   Sulfur Hives, Other (See Comments)   Should this be "Sulfa" (??)      Medication List    STOP taking these medications   doxycycline 100 MG capsule Commonly known as: VIBRAMYCIN     TAKE these medications   aspirin EC 81 MG tablet Take 81 mg by mouth daily.   fluticasone 50 MCG/ACT nasal spray Commonly known as: FLONASE Place 1-2 sprays into both nostrils daily as needed for allergies or rhinitis.   gabapentin 100 MG capsule Commonly known as: NEURONTIN Take 100 mg by mouth at bedtime.   losartan-hydrochlorothiazide 100-25 MG tablet Commonly known as: HYZAAR Take 1 tablet by mouth daily.   naproxen sodium 220 MG tablet Commonly known as: ALEVE Take 220-440 mg by mouth 2 (two) times daily as needed (for headaches or pain).   omeprazole 20 MG tablet Commonly known as: PRILOSEC OTC Take 20 mg by mouth daily before breakfast.   ondansetron 4 MG tablet Commonly known as:  Zofran Take 1 tablet (4 mg total) by mouth every 8 (eight) hours as needed.   PRESERVISION AREDS PO Take 2 capsules by mouth at bedtime.   traMADol 50 MG tablet Commonly known as: ULTRAM Take 50-100 mg by mouth See admin instructions. Take 50-100 mg by mouth every six to eight hours as needed for pain   valACYclovir 1000 MG tablet Commonly known as: VALTREX Take 1,000 mg by mouth every 12 (twelve) hours as needed (for  fever blisters and no more than 4 tablets total per episode).   Ventolin HFA 108 (90 Base) MCG/ACT inhaler Generic drug: albuterol   Vitamin D3 25 MCG (1000 UT) Caps Take 1,000 Units by mouth daily.   vitamin E 180 MG (400 UNITS) capsule Take 400 Units by mouth daily.      Allergies  Allergen Reactions  . Adhesive [Tape] Other (See Comments)    PEELS OFF THE SKIN!!  . Clindamycin/Lincomycin Nausea And Vomiting  . Codeine Nausea Only  . Dilaudid [Hydromorphone Hcl] Nausea And Vomiting  . Fenofibrate Other (See Comments)    Reaction not recalled  . Morphine And Related Nausea And Vomiting  . Sulfa Antibiotics Hives  . Sulfur Hives and Other (See Comments)    Should this be "Sulfa" (??)    Follow-up Information    Richardson Dopp T, PA-C Follow up.   Specialties: Cardiology, Physician Assistant Why: [facility] - a follow-up has been arranged for you on Tuesday Jul 02, 2020 at 9:15 AM (Arrive by 9:00 AM). This is with Richardson Dopp, one of our PAs that works closely with our cardiology team. Contact information: [place]. [address] [place] [address] [phone]        Cyndi Bender, PA-C Follow up.   Specialty: Physician Assistant Contact information: [place] [address] [phone]                The results of significant diagnostics from this hospitalization (including imaging, microbiology, ancillary and laboratory) are listed below for reference.    Significant Diagnostic Studies: DG Chest 1 View  Result Date: 05/23/2020 CLINICAL DATA:  Chest pain. EXAM: CHEST  1 VIEW COMPARISON:  Chest x-ray dated Apr 29, 2017. FINDINGS: The heart size and mediastinal contours are within normal limits. Both lungs are clear. The visualized skeletal structures are unremarkable. IMPRESSION: No active disease. Electronically Signed   By: Titus Dubin M.D.   On: 05/23/2020 17:45   CT Angio Chest PE W/Cm &/Or Wo Cm  Result Date:  05/23/2020 CLINICAL DATA:  Shortness of breath EXAM: CT ANGIOGRAPHY CHEST WITH CONTRAST TECHNIQUE: Multidetector CT imaging of the chest was performed using the standard protocol during bolus administration of intravenous contrast. Multiplanar CT image reconstructions and MIPs were obtained to evaluate the vascular anatomy. CONTRAST:  137mL OMNIPAQUE IOHEXOL 350 MG/ML SOLN COMPARISON:  Chest x-ray 05/23/2020, CT 04/09/2006 FINDINGS: Cardiovascular: Satisfactory opacification of the pulmonary arteries to the segmental level. No evidence of pulmonary embolism. Nonaneurysmal aorta. Moderate aortic atherosclerosis. Coronary vascular calcification. Normal heart size. No pericardial effusion Mediastinum/Nodes: Midline trachea. No suspicious thyroid mass. No significant mediastinal adenopathy. Esophagus shows small hiatal hernia Lungs/Pleura: No consolidation or pleural effusion. Patchy perihilar lung densities possible atelectasis or small airways disease. Multiple bilateral pulmonary nodules, for example 4 mm left upper lobe lung nodule, series 6 image number 26. 6 mm right middle lobe lung nodule, mildly increased in size, series 6, image number 45. Multiple additional punctate nodules in the right upper  lobe and left lower lobe. Upper Abdomen: Colon diverticular disease.  No acute abnormality Musculoskeletal: No chest wall abnormality. No acute or significant osseous findings. Probable hemangioma in the T7 vertebral body. Review of the MIP images confirms the above findings. IMPRESSION: 1. Negative for acute pulmonary embolus. No acute pulmonary airspace disease. 2. Multiple bilateral lung nodules measuring up to 6 mm in size. Non-contrast chest CT at 3-6 months is recommended. If the nodules are stable at time of repeat CT, then future CT at 18-24 months (from today's scan) is considered optional for low-risk patients, but is recommended for high-risk patients. This recommendation follows the consensus statement:  Guidelines for Management of Incidental Pulmonary Nodules Detected on CT Images: From the Fleischner Society 2017; Radiology 2017; 284:228-243. Aortic Atherosclerosis (ICD10-I70.0). Electronically Signed   By: Donavan Foil M.D.   On: 05/23/2020 20:58   NM Myocar Multi W/Spect W/Wall Motion / EF  Result Date: 05/24/2020  There was no ST segment deviation noted during stress.  Defect 1: There is a small defect of severe severity present in the apex location.  This is a low risk study.  The left ventricular ejection fraction is normal (55-65%).  Nuclear stress EF: 59%.  Low risk, mildly abnormal stress nuclear study with apical thinning versus small prior infarct.  No ischemia.  Gated ejection fraction 59% with normal wall motion.    Microbiology: Recent Results (from the past 240 hour(s))  SARS Coronavirus 2 by RT PCR (hospital order, performed in [facility] hospital lab) Nasopharyngeal Nasopharyngeal Swab     Status: None   Collection Time: 05/23/20 10:55 PM   Specimen: Nasopharyngeal Swab  Result Value Ref Range Status   SARS Coronavirus 2 NEGATIVE NEGATIVE Final    Comment: (NOTE) SARS-CoV-2 target nucleic acids are NOT DETECTED.  The SARS-CoV-2 RNA is generally detectable in upper and lower respiratory specimens during the acute phase of infection. The lowest concentration of SARS-CoV-2 viral copies this assay can detect is 250 copies / mL. A negative result does not preclude SARS-CoV-2 infection and should not be used as the sole basis for treatment or other patient management decisions.  A negative result may occur with improper specimen collection / handling, submission of specimen other than nasopharyngeal swab, presence of viral mutation(s) within the areas targeted by this assay, and inadequate number of viral copies (<250 copies / mL). A negative result must be combined with clinical observations, patient history, and epidemiological information.  Fact Sheet for Patients:    StrictlyIdeas.no  Fact Sheet for Healthcare Providers: BankingDealers.co.za  This test is not yet approved or  cleared by the [place] FDA and has been authorized for detection and/or diagnosis of SARS-CoV-2 by FDA under an Emergency Use Authorization (EUA).  This EUA will remain in effect (meaning this test can be used) for the duration of the COVID-19 declaration under Section 564(b)(1) of the Act, 21 U.S.C. section 360bbb-3(b)(1), unless the authorization is terminated or revoked sooner.  Performed at [facility], [place] [address], [place], [place] [address]   Culture, Urine     Status: Abnormal   Collection Time: 05/24/20  1:57 AM   Specimen: Urine, Random  Result Value Ref Range Status   Specimen Description URINE, RANDOM  Final   Special Requests   Final    NONE Performed at [facility], [place] [address], [place], [place] [address]    Culture (A)  Final    70,000 COLONIES/mL MULTIPLE SPECIES PRESENT, SUGGEST RECOLLECTION  Report Status 05/25/2020 FINAL  Final     Labs: Basic Metabolic Panel: Recent Labs  Lab 05/23/20 1745 05/24/20 0316  NA 136 137  K 4.3 3.8  CL 100 102  CO2 30 27  GLUCOSE 126* 122*  BUN 19 12  CREATININE 0.84 0.82  CALCIUM 9.2 9.3  MG  --  1.8  PHOS  --  2.9   Liver Function Tests: Recent Labs  Lab 05/24/20 0316  AST 23  ALT 13  ALKPHOS 90  BILITOT 0.5  PROT 6.5  ALBUMIN 3.4*   No results for input(s): LIPASE, AMYLASE in the last 168 hours. No results for input(s): AMMONIA in the last 168 hours. CBC: Recent Labs  Lab 05/23/20 1745 05/24/20 0316  WBC 6.6 6.4  NEUTROABS  --  3.4  HGB 12.4 12.3  HCT 37.2 37.4  MCV 95.9 93.7  PLT 258 239   Cardiac Enzymes: No results for input(s): CKTOTAL, CKMB, CKMBINDEX, TROPONINI in the last 168 hours. BNP: BNP (last 3 results) No results for input(s): BNP in the last 8760 hours.  ProBNP (last 3 results) No results for  input(s): PROBNP in the last 8760 hours.  CBG: No results for input(s): GLUCAP in the last 168 hours.     Signed:  Domenic Polite MD.  [facility] 05/27/2020, 11:38 AM

## 2020-07-01 DIAGNOSIS — M48061 Spinal stenosis, lumbar region without neurogenic claudication: Secondary | ICD-10-CM | POA: Diagnosis not present

## 2020-07-01 DIAGNOSIS — M1612 Unilateral primary osteoarthritis, left hip: Secondary | ICD-10-CM | POA: Diagnosis not present

## 2020-07-01 DIAGNOSIS — M79671 Pain in right foot: Secondary | ICD-10-CM | POA: Diagnosis not present

## 2020-07-01 DIAGNOSIS — G629 Polyneuropathy, unspecified: Secondary | ICD-10-CM | POA: Diagnosis not present

## 2020-07-01 NOTE — Progress Notes (Signed)
Cardiology Office Note:    Date:  07/02/2020   ID:  [name], DOB 09-Mar-1931, MRN [id number]  PCP:  Cyndi Bender, PA-C  Cardiologist:  Candee Furbish, MD   Electrophysiologist:  None   Referring MD: Cyndi Bender, PA-C   Chief Complaint:  Hospitalization Follow-up (Admitted with chest pain)    Patient Profile:    [name] is a 86 y.o. female with:   Paroxysmal atrial fibrillation (2010 after knee surgery)  Chronic kidney disease     GERD   Hypertension   Hyperlipidemia   Spinal stenosis   Aortic atherosclerosis  Carotid stenosis  [place] 08/2019: Bilat ICA 1-39  Lung nodules   Myoview 6/21: no ischemia, low risk  Echocardiogram 4/18: EF 65-70, Gr 1 DD, PASP 40   Prior CV studies: Myoview 05/24/20 EF 59, apical thinning vs prior infarct, no ischemia; low risk  Chest CTA 05/23/20 No pulmonary embolism, multiple bilateral lung nodules, coronary vascular calcification, aortic atherosclerosis  Carotid US 09/12/2019 Bilat ICA 1-39  Echocardiogram 03/09/17 Mild LVH, EF 65-70, no RWMA, Gr 1 DD, mild TR, PASP 40  Myoview 03/02/17 EF 63, apical thinning, normal perfusion, low risk   History of Present Illness:    [name] was evaluated by Dr. Marlou Porch in 2018 for shortness of breath.  An echocardiogram demonstrated normal LVF.  A Myoview was low risk.  She was recently admitted in 04/2020 with chest pain.  Her hs-Trop remained negative.  CT was neg for PE.  There was a lung nodule that will need follow up imaging.  An inpatient Myoview was low risk.  She returns for follow up.   She is here with her daughter. Since discharge from the hospital, she has not had further chest pain, shortness of breath. She has not had orthopnea, significant leg swelling or syncope. She has significant back issues and ambulates with a stand-up walker.  Past Medical History:  Diagnosis Date  . Chronic renal insufficiency, stage III (moderate)   . DDD (degenerative disc disease), lumbar    . Diverticulosis   . Dizziness   . GERD (gastroesophageal reflux disease)   . Herpes simplex infection   . History of anemia   . History of atrial fibrillation   . HTN (hypertension)   . Mixed hyperlipidemia   . Osteoarthritis   . Osteopenia   . Prediabetes   . SOB (shortness of breath) on exertion   . Spinal stenosis of lumbar region at multiple levels     Current Medications: Current Meds  Medication Sig  . aspirin EC 81 MG tablet Take 81 mg by mouth daily.   . Cholecalciferol (VITAMIN D3) 1000 units CAPS Take 1,000 Units by mouth daily.   Marland Kitchen gabapentin (NEURONTIN) 100 MG capsule Take 100 mg by mouth at bedtime.  Marland Kitchen losartan-hydrochlorothiazide (HYZAAR) 100-25 MG tablet Take 1 tablet by mouth daily.  . Multiple Vitamins-Minerals (PRESERVISION AREDS PO) Take 2 capsules by mouth at bedtime.   . naproxen sodium (ALEVE) 220 MG tablet Take 220-440 mg by mouth 2 (two) times daily as needed (for headaches or pain).  Marland Kitchen omeprazole (PRILOSEC OTC) 20 MG tablet Take 20 mg by mouth daily before breakfast.  . ondansetron (ZOFRAN) 4 MG tablet Take 1 tablet (4 mg total) by mouth every 8 (eight) hours as needed.  . valACYclovir (VALTREX) 1000 MG tablet Take 1,000 mg by mouth every 12 (twelve) hours as needed (for fever blisters and no more than 4 tablets total per episode).   Marland Kitchen  vitamin E 400 UNIT capsule Take 400 Units by mouth daily.     Allergies:   Adhesive [tape], Clindamycin/lincomycin, Codeine, Dilaudid [hydromorphone hcl], Fenofibrate, Morphine and related, Sulfa antibiotics, and Sulfur   Social History   Tobacco Use  . Smoking status: Never Smoker  . Smokeless tobacco: Never Used  Substance Use Topics  . Alcohol use: No  . Drug use: No     Family Hx: The patient's family history includes Diabetes in her mother; Heart attack in her father and mother; Other in her father.  ROS   EKGs/Labs/Other Test Reviewed:    EKG:  EKG is not ordered today.  The ekg ordered today  demonstrates n/a  Recent Labs: 05/24/2020: ALT 13; BUN 12; Creatinine, Ser 0.82; Hemoglobin 12.3; Magnesium 1.8; Platelets 239; Potassium 3.8; Sodium 137; TSH 5.610   Recent Lipid Panel Lab Results  Component Value Date/Time   CHOL 209 (H) 05/24/2020 12:37 AM   TRIG 159 (H) 05/24/2020 12:37 AM   HDL 49 05/24/2020 12:37 AM   CHOLHDL 4.3 05/24/2020 12:37 AM   LDLCALC 128 (H) 05/24/2020 12:37 AM    Physical Exam:    VS:  BP 130/60   Pulse 95   Ht 5\' 7"  (1.702 m)   Wt 209 lb 6.4 oz (95 kg)   SpO2 97%   BMI 32.80 kg/m     Wt Readings from Last 3 Encounters:  07/02/20 209 lb 6.4 oz (95 kg)  05/24/20 208 lb 14.4 oz (94.8 kg)  03/02/17 205 lb (93 kg)     Constitutional:      Appearance: Healthy appearance. Not in distress.  Pulmonary:     Effort: Pulmonary effort is normal.     Breath sounds: No wheezing. No rales.  Cardiovascular:     Normal rate. Regular rhythm. Normal S1. Normal S2.     Murmurs: There is no murmur.  Edema:    Peripheral edema absent.  Abdominal:     Palpations: Abdomen is soft.  Musculoskeletal:     Cervical back: Neck supple. Skin:    General: Skin is warm and dry.  Neurological:     Mental Status: Alert and oriented to person, place and time.     Cranial Nerves: Cranial nerves are intact.       ASSESSMENT & PLAN:    1. Other chest pain She ruled out for MI in the hospital an her Myoview was low risk.  She has not had further chest pain.  No further testing is needed.    2. Essential hypertension The patient's blood pressure is controlled on her current regimen.  Continue current therapy.    3. Mixed hyperlipidemia LDL 128.  She has coronary Ca2+ on CT.  We discussed the benefit of statin Rx to reduce CV risk.  She notes a hx of myalgias with Atorvastatin.  Start Rosuvastatin 5 mg once daily.  Lipids, LFTs in 38mos.   4. Pulmonary nodule She had multiple bilateral pulmonary nodules on her CT and needs follow up in 3-6 mos.  I have asked her  to follow up with her PCP to have this arranged.  If needed, her PCP could refer her to the pulmonary nodule clinic for surveillance.  I have asked her to contact her PCP to arrange follow up.      Dispo:  Return in about 1 year (around 07/02/2021) for Routine Follow Up w/ Dr. Marlou Porch, or Richardson Dopp, PA-C, in person.   Medication Adjustments/Labs and Tests Ordered: Current medicines  are reviewed at length with the patient today.  Concerns regarding medicines are outlined above.  Tests Ordered: Orders Placed This Encounter  Procedures  . Lipid panel  . Hepatic function panel   Medication Changes: Meds ordered this encounter  Medications  . DISCONTD: rosuvastatin (CRESTOR) 5 MG tablet    Sig: Take 1 tablet (5 mg total) by mouth daily.    Dispense:  90 tablet    Refill:  3  . DISCONTD: rosuvastatin (CRESTOR) 5 MG tablet    Sig: Take 1 tablet (5 mg total) by mouth daily.    Dispense:  90 tablet    Refill:  3  . DISCONTD: rosuvastatin (CRESTOR) 5 MG tablet    Sig: Take 1 tablet (5 mg total) by mouth daily.    Dispense:  90 tablet    Refill:  3  . rosuvastatin (CRESTOR) 5 MG tablet    Sig: Take 1 tablet (5 mg total) by mouth daily.    Dispense:  90 tablet    Refill:  3    Signed, Richardson Dopp, PA-C  07/02/2020 12:50 PM    [facility] [address], [place], [place]  [address] Phone: [phone]; Fax: [phone]

## 2020-07-02 ENCOUNTER — Encounter: Payer: Self-pay | Admitting: Physician Assistant

## 2020-07-02 ENCOUNTER — Ambulatory Visit: Payer: Medicare HMO | Admitting: Physician Assistant

## 2020-07-02 ENCOUNTER — Other Ambulatory Visit: Payer: Self-pay

## 2020-07-02 VITALS — BP 130/60 | HR 95 | Ht 67.0 in | Wt 209.4 lb

## 2020-07-02 DIAGNOSIS — I10 Essential (primary) hypertension: Secondary | ICD-10-CM

## 2020-07-02 DIAGNOSIS — E782 Mixed hyperlipidemia: Secondary | ICD-10-CM | POA: Diagnosis not present

## 2020-07-02 DIAGNOSIS — R911 Solitary pulmonary nodule: Secondary | ICD-10-CM | POA: Diagnosis not present

## 2020-07-02 DIAGNOSIS — R0789 Other chest pain: Secondary | ICD-10-CM | POA: Diagnosis not present

## 2020-07-02 MED ORDER — ROSUVASTATIN CALCIUM 5 MG PO TABS
5.0000 mg | ORAL_TABLET | Freq: Every day | ORAL | 3 refills | Status: DC
Start: 2020-07-02 — End: 2020-07-02

## 2020-07-02 MED ORDER — ROSUVASTATIN CALCIUM 5 MG PO TABS
5.0000 mg | ORAL_TABLET | Freq: Every day | ORAL | 3 refills | Status: DC
Start: 2020-07-02 — End: 2021-04-07

## 2020-07-02 NOTE — Patient Instructions (Addendum)
Medication Instructions:  Your physician has recommended you make the following change in your medication:   1) Start Rosuvastatin 5 mg, 1 tablet by mouth once a day  *If you need a refill on your cardiac medications before your next appointment, please call your pharmacy*  Lab Work: Your physician recommends that you return for lab work in 3 months on 10/04/20. The lab is open from 7:30AM-4:30PM, you may come any time between those hours, just make sure you are fasting since cholesterol is being checked.  Testing/Procedures: None ordered today  Follow-Up: At [facility], you and your health needs are our priority.  As part of our continuing mission to provide you with exceptional heart care, we have created designated Provider Care Teams.  These Care Teams include your primary Cardiologist (physician) and Advanced Practice Providers (APPs -  Physician Assistants and Nurse Practitioners) who all work together to provide you with the care you need, when you need it.  We recommend signing up for the patient portal called "MyChart".  Sign up information is provided on this After Visit Summary.  MyChart is used to connect with patients for Virtual Visits (Telemedicine).  Patients are able to view lab/test results, encounter notes, upcoming appointments, etc.  Non-urgent messages can be sent to your provider as well.   To learn more about what you can do with MyChart, go to [email or web address].    Your next appointment:   12 month(s)  The format for your next appointment:   In Person  Provider:   You may see Candee Furbish, MD or Richardson Dopp, PA-C

## 2020-07-04 DIAGNOSIS — M1612 Unilateral primary osteoarthritis, left hip: Secondary | ICD-10-CM | POA: Diagnosis not present

## 2020-07-04 DIAGNOSIS — G629 Polyneuropathy, unspecified: Secondary | ICD-10-CM | POA: Diagnosis not present

## 2020-07-04 DIAGNOSIS — M79671 Pain in right foot: Secondary | ICD-10-CM | POA: Diagnosis not present

## 2020-07-04 DIAGNOSIS — M48061 Spinal stenosis, lumbar region without neurogenic claudication: Secondary | ICD-10-CM | POA: Diagnosis not present

## 2020-07-08 DIAGNOSIS — M79671 Pain in right foot: Secondary | ICD-10-CM | POA: Diagnosis not present

## 2020-07-08 DIAGNOSIS — M1612 Unilateral primary osteoarthritis, left hip: Secondary | ICD-10-CM | POA: Diagnosis not present

## 2020-07-08 DIAGNOSIS — M48061 Spinal stenosis, lumbar region without neurogenic claudication: Secondary | ICD-10-CM | POA: Diagnosis not present

## 2020-07-08 DIAGNOSIS — G629 Polyneuropathy, unspecified: Secondary | ICD-10-CM | POA: Diagnosis not present

## 2020-07-22 DIAGNOSIS — M1612 Unilateral primary osteoarthritis, left hip: Secondary | ICD-10-CM | POA: Diagnosis not present

## 2020-07-22 DIAGNOSIS — G629 Polyneuropathy, unspecified: Secondary | ICD-10-CM | POA: Diagnosis not present

## 2020-07-22 DIAGNOSIS — M48061 Spinal stenosis, lumbar region without neurogenic claudication: Secondary | ICD-10-CM | POA: Diagnosis not present

## 2020-07-22 DIAGNOSIS — M79671 Pain in right foot: Secondary | ICD-10-CM | POA: Diagnosis not present

## 2020-07-24 DIAGNOSIS — G629 Polyneuropathy, unspecified: Secondary | ICD-10-CM | POA: Diagnosis not present

## 2020-07-24 DIAGNOSIS — M48061 Spinal stenosis, lumbar region without neurogenic claudication: Secondary | ICD-10-CM | POA: Diagnosis not present

## 2020-07-24 DIAGNOSIS — M79671 Pain in right foot: Secondary | ICD-10-CM | POA: Diagnosis not present

## 2020-07-24 DIAGNOSIS — M1612 Unilateral primary osteoarthritis, left hip: Secondary | ICD-10-CM | POA: Diagnosis not present

## 2020-07-29 DIAGNOSIS — M1612 Unilateral primary osteoarthritis, left hip: Secondary | ICD-10-CM | POA: Diagnosis not present

## 2020-07-29 DIAGNOSIS — G629 Polyneuropathy, unspecified: Secondary | ICD-10-CM | POA: Diagnosis not present

## 2020-07-29 DIAGNOSIS — M79671 Pain in right foot: Secondary | ICD-10-CM | POA: Diagnosis not present

## 2020-07-29 DIAGNOSIS — M48061 Spinal stenosis, lumbar region without neurogenic claudication: Secondary | ICD-10-CM | POA: Diagnosis not present

## 2020-07-31 DIAGNOSIS — M1612 Unilateral primary osteoarthritis, left hip: Secondary | ICD-10-CM | POA: Diagnosis not present

## 2020-07-31 DIAGNOSIS — M48061 Spinal stenosis, lumbar region without neurogenic claudication: Secondary | ICD-10-CM | POA: Diagnosis not present

## 2020-07-31 DIAGNOSIS — G629 Polyneuropathy, unspecified: Secondary | ICD-10-CM | POA: Diagnosis not present

## 2020-07-31 DIAGNOSIS — M79671 Pain in right foot: Secondary | ICD-10-CM | POA: Diagnosis not present

## 2020-08-09 DIAGNOSIS — M79671 Pain in right foot: Secondary | ICD-10-CM | POA: Diagnosis not present

## 2020-08-09 DIAGNOSIS — M48061 Spinal stenosis, lumbar region without neurogenic claudication: Secondary | ICD-10-CM | POA: Diagnosis not present

## 2020-08-09 DIAGNOSIS — M1612 Unilateral primary osteoarthritis, left hip: Secondary | ICD-10-CM | POA: Diagnosis not present

## 2020-08-09 DIAGNOSIS — G629 Polyneuropathy, unspecified: Secondary | ICD-10-CM | POA: Diagnosis not present

## 2020-08-12 DIAGNOSIS — M79671 Pain in right foot: Secondary | ICD-10-CM | POA: Diagnosis not present

## 2020-08-12 DIAGNOSIS — G629 Polyneuropathy, unspecified: Secondary | ICD-10-CM | POA: Diagnosis not present

## 2020-08-12 DIAGNOSIS — M48061 Spinal stenosis, lumbar region without neurogenic claudication: Secondary | ICD-10-CM | POA: Diagnosis not present

## 2020-08-12 DIAGNOSIS — M1612 Unilateral primary osteoarthritis, left hip: Secondary | ICD-10-CM | POA: Diagnosis not present

## 2020-08-14 DIAGNOSIS — M1612 Unilateral primary osteoarthritis, left hip: Secondary | ICD-10-CM | POA: Diagnosis not present

## 2020-08-14 DIAGNOSIS — M48061 Spinal stenosis, lumbar region without neurogenic claudication: Secondary | ICD-10-CM | POA: Diagnosis not present

## 2020-08-14 DIAGNOSIS — M79671 Pain in right foot: Secondary | ICD-10-CM | POA: Diagnosis not present

## 2020-08-14 DIAGNOSIS — G629 Polyneuropathy, unspecified: Secondary | ICD-10-CM | POA: Diagnosis not present

## 2020-08-15 DIAGNOSIS — Z6833 Body mass index (BMI) 33.0-33.9, adult: Secondary | ICD-10-CM | POA: Diagnosis not present

## 2020-08-15 DIAGNOSIS — Z23 Encounter for immunization: Secondary | ICD-10-CM | POA: Diagnosis not present

## 2020-08-15 DIAGNOSIS — B009 Herpesviral infection, unspecified: Secondary | ICD-10-CM | POA: Diagnosis not present

## 2020-08-15 DIAGNOSIS — M5416 Radiculopathy, lumbar region: Secondary | ICD-10-CM | POA: Diagnosis not present

## 2020-08-15 DIAGNOSIS — M25551 Pain in right hip: Secondary | ICD-10-CM | POA: Diagnosis not present

## 2020-08-15 DIAGNOSIS — R918 Other nonspecific abnormal finding of lung field: Secondary | ICD-10-CM | POA: Diagnosis not present

## 2020-08-16 ENCOUNTER — Other Ambulatory Visit: Payer: Self-pay | Admitting: Family Medicine

## 2020-08-16 ENCOUNTER — Ambulatory Visit
Admission: RE | Admit: 2020-08-16 | Discharge: 2020-08-16 | Disposition: A | Discharge status: Home / Self Care | Payer: Medicare HMO | Source: Ambulatory Visit | Attending: Family Medicine | Admitting: Family Medicine

## 2020-08-16 DIAGNOSIS — M25551 Pain in right hip: Secondary | ICD-10-CM

## 2020-08-16 DIAGNOSIS — M5416 Radiculopathy, lumbar region: Secondary | ICD-10-CM

## 2020-08-16 DIAGNOSIS — R918 Other nonspecific abnormal finding of lung field: Secondary | ICD-10-CM

## 2020-08-16 DIAGNOSIS — M1611 Unilateral primary osteoarthritis, right hip: Secondary | ICD-10-CM | POA: Diagnosis not present

## 2020-08-26 DIAGNOSIS — M79671 Pain in right foot: Secondary | ICD-10-CM | POA: Diagnosis not present

## 2020-08-26 DIAGNOSIS — M48061 Spinal stenosis, lumbar region without neurogenic claudication: Secondary | ICD-10-CM | POA: Diagnosis not present

## 2020-08-26 DIAGNOSIS — G629 Polyneuropathy, unspecified: Secondary | ICD-10-CM | POA: Diagnosis not present

## 2020-08-26 DIAGNOSIS — M1612 Unilateral primary osteoarthritis, left hip: Secondary | ICD-10-CM | POA: Diagnosis not present

## 2020-08-28 DIAGNOSIS — M79671 Pain in right foot: Secondary | ICD-10-CM | POA: Diagnosis not present

## 2020-08-28 DIAGNOSIS — G629 Polyneuropathy, unspecified: Secondary | ICD-10-CM | POA: Diagnosis not present

## 2020-08-28 DIAGNOSIS — M48061 Spinal stenosis, lumbar region without neurogenic claudication: Secondary | ICD-10-CM | POA: Diagnosis not present

## 2020-08-28 DIAGNOSIS — M1612 Unilateral primary osteoarthritis, left hip: Secondary | ICD-10-CM | POA: Diagnosis not present

## 2020-08-29 ENCOUNTER — Other Ambulatory Visit: Payer: Self-pay

## 2020-08-29 ENCOUNTER — Ambulatory Visit
Admission: RE | Admit: 2020-08-29 | Discharge: 2020-08-29 | Disposition: A | Discharge status: Home / Self Care | Payer: Medicare HMO | Source: Ambulatory Visit | Attending: Family Medicine | Admitting: Family Medicine

## 2020-08-29 DIAGNOSIS — I2510 Atherosclerotic heart disease of native coronary artery without angina pectoris: Secondary | ICD-10-CM | POA: Diagnosis not present

## 2020-08-29 DIAGNOSIS — R918 Other nonspecific abnormal finding of lung field: Secondary | ICD-10-CM | POA: Diagnosis not present

## 2020-08-29 DIAGNOSIS — I700 Atherosclerosis of aorta: Secondary | ICD-10-CM | POA: Diagnosis not present

## 2020-09-02 DIAGNOSIS — M1612 Unilateral primary osteoarthritis, left hip: Secondary | ICD-10-CM | POA: Diagnosis not present

## 2020-09-02 DIAGNOSIS — G629 Polyneuropathy, unspecified: Secondary | ICD-10-CM | POA: Diagnosis not present

## 2020-09-02 DIAGNOSIS — M48061 Spinal stenosis, lumbar region without neurogenic claudication: Secondary | ICD-10-CM | POA: Diagnosis not present

## 2020-09-02 DIAGNOSIS — M79671 Pain in right foot: Secondary | ICD-10-CM | POA: Diagnosis not present

## 2020-09-04 DIAGNOSIS — G629 Polyneuropathy, unspecified: Secondary | ICD-10-CM | POA: Diagnosis not present

## 2020-09-04 DIAGNOSIS — M79671 Pain in right foot: Secondary | ICD-10-CM | POA: Diagnosis not present

## 2020-09-04 DIAGNOSIS — M48061 Spinal stenosis, lumbar region without neurogenic claudication: Secondary | ICD-10-CM | POA: Diagnosis not present

## 2020-09-04 DIAGNOSIS — M1612 Unilateral primary osteoarthritis, left hip: Secondary | ICD-10-CM | POA: Diagnosis not present

## 2020-09-06 ENCOUNTER — Ambulatory Visit
Admission: RE | Admit: 2020-09-06 | Discharge: 2020-09-06 | Disposition: A | Discharge status: Home / Self Care | Payer: Medicare HMO | Source: Ambulatory Visit | Attending: Family Medicine | Admitting: Family Medicine

## 2020-09-06 ENCOUNTER — Other Ambulatory Visit: Payer: Self-pay

## 2020-09-06 DIAGNOSIS — M5416 Radiculopathy, lumbar region: Secondary | ICD-10-CM

## 2020-09-06 DIAGNOSIS — M48061 Spinal stenosis, lumbar region without neurogenic claudication: Secondary | ICD-10-CM | POA: Diagnosis not present

## 2020-09-06 DIAGNOSIS — R918 Other nonspecific abnormal finding of lung field: Secondary | ICD-10-CM

## 2020-09-12 DIAGNOSIS — Z6833 Body mass index (BMI) 33.0-33.9, adult: Secondary | ICD-10-CM | POA: Diagnosis not present

## 2020-09-12 DIAGNOSIS — I10 Essential (primary) hypertension: Secondary | ICD-10-CM | POA: Diagnosis not present

## 2020-09-12 DIAGNOSIS — M4316 Spondylolisthesis, lumbar region: Secondary | ICD-10-CM | POA: Diagnosis not present

## 2020-09-12 DIAGNOSIS — M48062 Spinal stenosis, lumbar region with neurogenic claudication: Secondary | ICD-10-CM | POA: Diagnosis not present

## 2020-09-16 ENCOUNTER — Other Ambulatory Visit: Payer: Self-pay | Admitting: Neurosurgery

## 2020-09-18 ENCOUNTER — Other Ambulatory Visit (HOSPITAL_COMMUNITY)
Admission: RE | Admit: 2020-09-18 | Discharge: 2020-09-18 | Disposition: A | Discharge status: Home / Self Care | Payer: Medicare HMO | Source: Ambulatory Visit | Attending: Neurosurgery | Admitting: Neurosurgery

## 2020-09-18 DIAGNOSIS — Z20822 Contact with and (suspected) exposure to covid-19: Secondary | ICD-10-CM | POA: Insufficient documentation

## 2020-09-18 DIAGNOSIS — Z01812 Encounter for preprocedural laboratory examination: Secondary | ICD-10-CM | POA: Insufficient documentation

## 2020-09-18 LAB — SARS CORONAVIRUS 2 (TAT 6-24 HRS): SARS Coronavirus 2: NEGATIVE

## 2020-09-19 ENCOUNTER — Other Ambulatory Visit: Payer: Self-pay

## 2020-09-19 ENCOUNTER — Encounter (HOSPITAL_COMMUNITY): Payer: Self-pay | Admitting: Neurosurgery

## 2020-09-19 NOTE — Progress Notes (Addendum)
Spoke with [name] for pre-op call. [name] has hx of A-fib in 2010 after surgery. She states she sees Dr. Marlou Porch for HTN and Aortic Sclerosis. [name] was admitted in June for chest pain, followed up with Richardson Dopp in August. Denies any CAD or chest pain. She states she does have some shortness of breath with exertion but she feels it's related to her back pain. [name] states she has the blood clot gene factor, but has never had a blood clot.  [name] states she is "pre-diabetic". She states her PCP, Cyndi Bender has never done an A1C but states because her blood sugar is usually around 120, they consider her pre-diabetic. She states she does check her blood sugar daily and it's usually around 120. She is not on any medications.  Covid test done 09/18/20 and it's negative. [name] states she has been in quarantine since the test was done and understands that she stays in quarantine until she comes to the hospital in the AM.  Chart sent to Anesthesia PA for review.

## 2020-09-19 NOTE — Progress Notes (Signed)
Anesthesia Chart Review: [name]   Case: [id number] Date/Time: 09/20/20 0845   Procedure: LUMBAR LAMINECTOMY/DECOMPRESSION MICRODISCECTOMY 3 LEVELS (N/A )   Anesthesia type: General   Pre-op diagnosis: SPINDOLOLISTHESIS   Location: [facility] ROOM 18 / [facility]   Surgeons: Ashok Pall, MD      DISCUSSION: Patient is an 86 year old female scheduled for the above procedure.  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  - Admission 05/23/20-05/25/20 for chest pain. She ruled out for ACS. CTA was negative for PE. Cardiology was consulted, and she underwent a nuclear stress test that was considered overall low risk (apical thinning versus small prior infarct, but no ischemia, EF 59%, normal wall motion). She did have incidental finding of multiple bilateral pulmonary nodules with CT follow-up in 3-6 months recommended. (Nodules were stable on 08/29/20 CT so felt likely benign sequelae of prior infection or inflammation, but if high risk then could consider following imaging in 18-24 months).  She was also advised urology follow-up for chronic/subacute hematuria.   Last cardiology visit 07/02/20 with Richardson Dopp, PA-C.  She had not had any recurrent chest pain since June admission.  No further cardiac testing recommended at that time.  Crestor recommended for mixed HLD.  1 year follow-up planned.  09/18/2020 presurgical COVID-19 test negative.  She is a same-day work-up, so anesthesia team evaluation and labs on the day of surgery.   VS:   Wt Readings from Last 3 Encounters:  07/02/20 95 kg  05/24/20 94.8 kg  03/02/17 93 kg   BP Readings from Last 3 Encounters:  07/02/20 130/60  05/25/20 (!) 103/49  05/20/17 (!) 141/79   Pulse Readings from Last 3 Encounters:  07/02/20 95  05/25/20 76   05/20/17 78    PROVIDERS: Cyndi Bender, PA-C is PCP  Vanice Sarah, MD is cardiologist Raynelle Bring, MD is urologist (for left partial nephrectomy 2010)   LABS: For day of surgery. As of 05/24/20, Cr 0.82, H/H 12.3/37.4, PLT 239, glucose 122.     PFTs 05/06/17: FVC 2.63 (93%), post 2.62 (92%). FEV1 2.18 (104%), post 2.19 (104%). FEV1/FVC ratio 83% (113%), post 84%.         IMAGES: MRI L-spine 09/06/20: IMPRESSION: 1. Progressive multilevel spondylosis of the lumbar spine as described. 2. Severe central canal stenosis at L3-4 is now present secondary to progressive advanced bilateral facet hypertrophy and uncovering of a broad-based disc protrusion with progressive anterolisthesis. 3. Severe right and moderate left foraminal stenosis at L3-4 has progressed. 4. Moderate right subarticular and foraminal stenosis at L4-5 has progressed. 5. Mild foraminal narrowing bilaterally at L1-2 is worse on the left. 6. Moderate foraminal narrowing bilaterally at L2-3 has progressed, left greater than right. 7. Mild facet hypertrophy at L5-S1 without significant stenosis.  CT Chest 08/29/20: IMPRESSION: 1. Multiple bilateral pulmonary nodules, stable compared to prior examination dated 05/23/2020, likely benign sequelae of prior infection or inflammation. Additional CT follow-up at 18-24 months (from baseline dated 05/23/2020) is considered optional for low-risk patients, but is recommended for high-risk patients. This recommendation follows the consensus statement: Guidelines for Management of Incidental Pulmonary Nodules Detected on CT Images: From the Fleischner Society 2017; Radiology 2017; 284:228-243. 2. Coronary artery disease.  Aortic Atherosclerosis (ICD10-I70.0).   EKG: 05/24/20: Sinus rhythm Borderline left axis deviation Borderline ST elevation, anterior leads more prominant st  e;evation anterior than prior 6/09 Confirmed by Aletta Edouard [phone]) on 05/23/2020 6:10:52  PM   CV: Nuclear stress test 05/24/20:  There was no ST segment deviation noted during stress.  Defect 1: There is a small defect of severe severity present in the apex location.  This is a low risk study.  The left ventricular ejection fraction is normal (55-65%).  Nuclear stress EF: 59%. Low risk, mildly abnormal stress nuclear study with apical thinning versus small prior infarct.  No ischemia.  Gated ejection fraction 59% with normal wall motion.   Carotid US 09/12/19: Summary:  - Right Carotid: Velocities in the right ICA are consistent with a 1-39% stenosis. Non-hemodynamically significant plaque <50% noted in the CCA. The ECA appears <50% stenosed.  - Left Carotid: Velocities in the left ICA are consistent with a 1-39% stenosis. Non-hemodynamically significant plaque <50% noted in the CCA. TheECA appears <50% stenosed.  - Vertebrals: Bilateral vertebral arteries demonstrate antegrade flow.  - Subclavians: Normal flow hemodynamics were seen in bilateral subclavian arteries.    Echo 03/09/17: Study Conclusions  - Left ventricle: The cavity size was normal. Wall thickness was  increased in a pattern of mild LVH. Systolic function was  vigorous. The estimated ejection fraction was in the range of 65%  to 70%. Wall motion was normal; there were no regional wall  motion abnormalities. Doppler parameters are consistent with  abnormal left ventricular relaxation (grade 1 diastolic  dysfunction). The E/e&' ratio is <8, suggesting normal LV filling  pressure.  - Left atrium: The atrium was normal in size.  - Tricuspid valve: There was mild regurgitation.  - Pulmonary arteries: PA peak pressure: 40 mm Hg (S).  - Inferior vena cava: The vessel was normal in size. The  respirophasic diameter changes were in the normal range (>= 50%),  consistent with normal central venous pressure.  Impressions:  - LVEF 65-70%, mild LVH, normal wall motion, grade 1 DD with  normal  LV filling pressure, normal LA size, mild TR, RVSP 40 mmHg,  normal IVC.    Past Medical History:  Diagnosis Date  . Anemia    low iron  . Chronic renal insufficiency, stage III (moderate) (HCC)   . DDD (degenerative disc disease), lumbar   . Diverticulosis   . Dizziness   . Dyspnea    thinks it's due to the back pain  . GERD (gastroesophageal reflux disease)   . Herpes simplex infection   . History of anemia   . History of atrial fibrillation   . HTN (hypertension)   . IBS (irritable bowel syndrome)   . Mixed hyperlipidemia   . Osteoarthritis   . Osteopenia   . PONV (postoperative nausea and vomiting)   . Pre-diabetes   . Prediabetes   . SOB (shortness of breath) on exertion   . Spinal stenosis of lumbar region at multiple levels     Past Surgical History:  Procedure Laterality Date  . COLONOSCOPY    . DENTAL SURGERY    . EYE SURGERY Bilateral    cataract surgery with lens implants  . KIDNEY SURGERY     growth removed from outside of kidney  . TOTAL KNEE ARTHROPLASTY Left   . TOTAL KNEE ARTHROPLASTY Right     MEDICATIONS: No current facility-administered medications for this encounter.   . Cholecalciferol (VITAMIN D3) 1000 units CAPS  . gabapentin (NEURONTIN) 100 MG capsule  . losartan-hydrochlorothiazide (HYZAAR) 100-25 MG tablet  . Multiple Vitamins-Minerals (PRESERVISION AREDS PO)  . naproxen  sodium (ALEVE) 220 MG tablet  . omeprazole (PRILOSEC OTC) 20 MG tablet  . rosuvastatin (CRESTOR) 5 MG tablet  . valACYclovir (VALTREX) 1000 MG tablet  . vitamin E 400 UNIT capsule    Myra Gianotti, PA-C Surgical Short Stay/Anesthesiology [facility] Phone [phone] [facility] Phone [phone] 09/19/2020 12:53 PM

## 2020-09-19 NOTE — Anesthesia Preprocedure Evaluation (Addendum)
Anesthesia Evaluation  Patient identified by MRN, date of birth, ID band Patient awake    Reviewed: Allergy & Precautions, NPO status , Patient's Chart, lab work & pertinent test results  History of Anesthesia Complications (+) PONV  Airway Mallampati: I  TM Distance: >3 FB Neck ROM: Full    Dental   Pulmonary    Pulmonary exam normal        Cardiovascular hypertension, Pt. on medications Normal cardiovascular exam+ dysrhythmias Atrial Fibrillation   Nuclear stress test 05/24/20:  There was no ST segment deviation noted during stress.  Defect 1: There is a small defect of severe severity present in the apex location.  This is a low risk study.  The left ventricular ejection fraction is normal (55-65%).  Nuclear stress EF: 59%. Low risk, mildly abnormal stress nuclear study with apical thinning versus small prior infarct. No ischemia. Gated ejection fraction 59% with normal wall motion.  Echo 03/09/17: Impressions:  - LVEF 65-70%, mild LVH, normal wall motion, grade 1 DD with normal  LV filling pressure, normal LA size, mild TR, RVSP 40 mmHg,  normal IVC.    Neuro/Psych    GI/Hepatic GERD  Medicated and Controlled,  Endo/Other    Renal/GU Renal InsufficiencyRenal disease     Musculoskeletal   Abdominal   Peds  Hematology   Anesthesia Other Findings   Reproductive/Obstetrics                            Anesthesia Physical Anesthesia Plan  ASA: III  Anesthesia Plan: General   Post-op Pain Management:    Induction: Intravenous  PONV Risk Score and Plan: 4 or greater and Ondansetron and Treatment may vary due to age or medical condition  Airway Management Planned: Oral ETT  Additional Equipment:   Intra-op Plan:   Post-operative Plan: Extubation in OR  Informed Consent: I have reviewed the patients History and Physical, chart, labs and discussed the procedure  including the risks, benefits and alternatives for the proposed anesthesia with the patient or authorized representative who has indicated his/her understanding and acceptance.       Plan Discussed with: CRNA and Surgeon  Anesthesia Plan Comments: (PAT note written 09/19/2020 by Myra Gianotti, PA-C. )       Anesthesia Quick Evaluation

## 2020-09-20 ENCOUNTER — Ambulatory Visit (HOSPITAL_COMMUNITY): Payer: Medicare HMO | Admitting: Vascular Surgery

## 2020-09-20 ENCOUNTER — Encounter (HOSPITAL_COMMUNITY)
Admission: RE | Disposition: A | Discharge status: Home / Self Care | Payer: Self-pay | Source: Home / Self Care | Attending: Neurosurgery

## 2020-09-20 ENCOUNTER — Other Ambulatory Visit: Payer: Self-pay

## 2020-09-20 ENCOUNTER — Encounter (HOSPITAL_COMMUNITY): Payer: Self-pay | Admitting: Neurosurgery

## 2020-09-20 ENCOUNTER — Observation Stay (HOSPITAL_COMMUNITY)
Admission: RE | Admit: 2020-09-20 | Discharge: 2020-09-21 | Disposition: A | Discharge status: Home / Self Care | Payer: Medicare HMO | Attending: Neurosurgery | Admitting: Neurosurgery

## 2020-09-20 ENCOUNTER — Ambulatory Visit (HOSPITAL_COMMUNITY): Payer: Medicare HMO

## 2020-09-20 DIAGNOSIS — M4316 Spondylolisthesis, lumbar region: Secondary | ICD-10-CM | POA: Diagnosis not present

## 2020-09-20 DIAGNOSIS — M48062 Spinal stenosis, lumbar region with neurogenic claudication: Principal | ICD-10-CM | POA: Diagnosis present

## 2020-09-20 DIAGNOSIS — Z7982 Long term (current) use of aspirin: Secondary | ICD-10-CM | POA: Diagnosis not present

## 2020-09-20 DIAGNOSIS — M48061 Spinal stenosis, lumbar region without neurogenic claudication: Secondary | ICD-10-CM | POA: Diagnosis not present

## 2020-09-20 DIAGNOSIS — I129 Hypertensive chronic kidney disease with stage 1 through stage 4 chronic kidney disease, or unspecified chronic kidney disease: Secondary | ICD-10-CM | POA: Diagnosis not present

## 2020-09-20 DIAGNOSIS — Z79899 Other long term (current) drug therapy: Secondary | ICD-10-CM | POA: Insufficient documentation

## 2020-09-20 DIAGNOSIS — I10 Essential (primary) hypertension: Secondary | ICD-10-CM | POA: Diagnosis not present

## 2020-09-20 DIAGNOSIS — Z981 Arthrodesis status: Secondary | ICD-10-CM | POA: Diagnosis not present

## 2020-09-20 DIAGNOSIS — Z419 Encounter for procedure for purposes other than remedying health state, unspecified: Secondary | ICD-10-CM

## 2020-09-20 DIAGNOSIS — Z96653 Presence of artificial knee joint, bilateral: Secondary | ICD-10-CM | POA: Insufficient documentation

## 2020-09-20 DIAGNOSIS — E782 Mixed hyperlipidemia: Secondary | ICD-10-CM | POA: Diagnosis not present

## 2020-09-20 DIAGNOSIS — N183 Chronic kidney disease, stage 3 unspecified: Secondary | ICD-10-CM | POA: Insufficient documentation

## 2020-09-20 DIAGNOSIS — R7303 Prediabetes: Secondary | ICD-10-CM | POA: Insufficient documentation

## 2020-09-20 HISTORY — DX: Other specified postprocedural states: R11.2

## 2020-09-20 HISTORY — DX: Nausea with vomiting, unspecified: Z98.890

## 2020-09-20 HISTORY — DX: Activated protein C resistance: D68.51

## 2020-09-20 HISTORY — DX: Anemia, unspecified: D64.9

## 2020-09-20 HISTORY — DX: Prediabetes: R73.03

## 2020-09-20 HISTORY — DX: Irritable bowel syndrome, unspecified: K58.9

## 2020-09-20 HISTORY — PX: LUMBAR LAMINECTOMY/DECOMPRESSION MICRODISCECTOMY: SHX5026

## 2020-09-20 HISTORY — DX: Dyspnea, unspecified: R06.00

## 2020-09-20 LAB — BASIC METABOLIC PANEL
Anion gap: 12 (ref 5–15)
BUN: 16 mg/dL (ref 8–23)
CO2: 24 mmol/L (ref 22–32)
Calcium: 9.3 mg/dL (ref 8.9–10.3)
Chloride: 100 mmol/L (ref 98–111)
Creatinine, Ser: 0.81 mg/dL (ref 0.44–1.00)
GFR, Estimated: >60 mL/min (ref >60)
Glucose, Bld: 98 mg/dL (ref 70–99)
Potassium: 3.8 mmol/L (ref 3.5–5.1)
Sodium: 136 mmol/L (ref 135–145)

## 2020-09-20 LAB — CBC
HCT: 37.9 % (ref 36.0–46.0)
Hemoglobin: 12.4 g/dL (ref 12.0–15.0)
MCH: 31.8 pg (ref 26.0–34.0)
MCHC: 32.7 g/dL (ref 30.0–36.0)
MCV: 97.2 fL (ref 80.0–100.0)
Platelets: 241 10*3/uL (ref 150–400)
RBC: 3.9 MIL/uL (ref 3.87–5.11)
RDW: 13.8 % (ref 11.5–15.5)
WBC: 6 10*3/uL (ref 4.0–10.5)
nRBC: 0 % (ref 0.0–0.2)

## 2020-09-20 LAB — SURGICAL PCR SCREEN
MRSA, PCR: NEGATIVE
Staphylococcus aureus: NEGATIVE

## 2020-09-20 LAB — GLUCOSE, CAPILLARY: Glucose-Capillary: 96 mg/dL (ref 70–99)

## 2020-09-20 SURGERY — LUMBAR LAMINECTOMY/DECOMPRESSION MICRODISCECTOMY 3 LEVELS
Anesthesia: General

## 2020-09-20 MED ORDER — LOSARTAN POTASSIUM 50 MG PO TABS
100.0000 mg | ORAL_TABLET | Freq: Every day | ORAL | Status: DC
  Administered 2020-09-20 – 2020-09-21 (×2): 100 mg via ORAL
  Filled 2020-09-20 (×2): qty 2

## 2020-09-20 MED ORDER — EPHEDRINE 5 MG/ML INJ
INTRAVENOUS | Status: AC
  Filled 2020-09-20: qty 10

## 2020-09-20 MED ORDER — OMEPRAZOLE MAGNESIUM 20 MG PO TBEC: 20.0000 mg | DELAYED_RELEASE_TABLET | Freq: Every day | ORAL | Status: DC

## 2020-09-20 MED ORDER — MEPERIDINE HCL 25 MG/ML IJ SOLN: 6.2500 mg | INTRAMUSCULAR | Status: DC | PRN

## 2020-09-20 MED ORDER — DOCUSATE SODIUM 100 MG PO CAPS
100.0000 mg | ORAL_CAPSULE | Freq: Two times a day (BID) | ORAL | Status: DC
  Administered 2020-09-20 – 2020-09-21 (×2): 100 mg via ORAL
  Filled 2020-09-20 (×2): qty 1

## 2020-09-20 MED ORDER — OXYCODONE HCL 5 MG PO TABS: 5.0000 mg | ORAL_TABLET | ORAL | Status: DC | PRN

## 2020-09-20 MED ORDER — PRESERVISION AREDS PO CAPS: 1.0000 | ORAL_CAPSULE | Freq: Two times a day (BID) | ORAL | Status: DC

## 2020-09-20 MED ORDER — SODIUM CHLORIDE 0.9% FLUSH: 3.0000 mL | INTRAVENOUS | Status: DC | PRN

## 2020-09-20 MED ORDER — THROMBIN 20000 UNITS EX SOLR
CUTANEOUS | Status: DC | PRN
  Administered 2020-09-20: 20 mL via TOPICAL

## 2020-09-20 MED ORDER — GABAPENTIN 100 MG PO CAPS
200.0000 mg | ORAL_CAPSULE | Freq: Three times a day (TID) | ORAL | Status: DC
  Administered 2020-09-20 – 2020-09-21 (×3): 200 mg via ORAL
  Filled 2020-09-20 (×3): qty 2

## 2020-09-20 MED ORDER — ACETAMINOPHEN 650 MG RE SUPP: 650.0000 mg | RECTAL | Status: DC | PRN

## 2020-09-20 MED ORDER — SENNOSIDES-DOCUSATE SODIUM 8.6-50 MG PO TABS
1.0000 | ORAL_TABLET | Freq: Every evening | ORAL | Status: DC | PRN
  Administered 2020-09-20: 1 via ORAL
  Filled 2020-09-20: qty 1

## 2020-09-20 MED ORDER — INFLUENZA VAC A&B SA ADJ QUAD 0.5 ML IM PRSY
0.5000 mL | PREFILLED_SYRINGE | INTRAMUSCULAR | Status: DC
  Filled 2020-09-20: qty 0.5

## 2020-09-20 MED ORDER — DEXAMETHASONE SODIUM PHOSPHATE 10 MG/ML IJ SOLN
INTRAMUSCULAR | Status: DC | PRN
  Administered 2020-09-20: 10 mg via INTRAVENOUS

## 2020-09-20 MED ORDER — CYCLOBENZAPRINE HCL 10 MG PO TABS: 10.0000 mg | ORAL_TABLET | Freq: Three times a day (TID) | ORAL | Status: DC | PRN

## 2020-09-20 MED ORDER — MAGNESIUM CITRATE PO SOLN: 1.0000 | Freq: Once | ORAL | Status: DC | PRN

## 2020-09-20 MED ORDER — ONDANSETRON HCL 4 MG/2ML IJ SOLN: 4.0000 mg | Freq: Four times a day (QID) | INTRAMUSCULAR | Status: DC | PRN

## 2020-09-20 MED ORDER — ONDANSETRON HCL 4 MG/2ML IJ SOLN
INTRAMUSCULAR | Status: AC
  Filled 2020-09-20: qty 2

## 2020-09-20 MED ORDER — SODIUM CHLORIDE 0.9% FLUSH
3.0000 mL | Freq: Two times a day (BID) | INTRAVENOUS | Status: DC
  Administered 2020-09-20 – 2020-09-21 (×2): 3 mL via INTRAVENOUS

## 2020-09-20 MED ORDER — HYDROMORPHONE HCL 1 MG/ML IJ SOLN: 0.2500 mg | INTRAMUSCULAR | Status: DC | PRN

## 2020-09-20 MED ORDER — ALBUMIN HUMAN 5 % IV SOLN: INTRAVENOUS | Status: DC | PRN

## 2020-09-20 MED ORDER — CHLORHEXIDINE GLUCONATE CLOTH 2 % EX PADS: 6.0000 | MEDICATED_PAD | Freq: Once | CUTANEOUS | Status: DC

## 2020-09-20 MED ORDER — ORAL CARE MOUTH RINSE: 15.0000 mL | Freq: Once | OROMUCOSAL | Status: AC

## 2020-09-20 MED ORDER — CHLORHEXIDINE GLUCONATE 0.12 % MT SOLN
15.0000 mL | Freq: Once | OROMUCOSAL | Status: AC
  Administered 2020-09-20: 15 mL via OROMUCOSAL
  Filled 2020-09-20: qty 15

## 2020-09-20 MED ORDER — CEFAZOLIN SODIUM-DEXTROSE 2-4 GM/100ML-% IV SOLN
INTRAVENOUS | Status: AC
  Filled 2020-09-20: qty 100

## 2020-09-20 MED ORDER — LIDOCAINE 2% (20 MG/ML) 5 ML SYRINGE
INTRAMUSCULAR | Status: AC
  Filled 2020-09-20: qty 5

## 2020-09-20 MED ORDER — ROSUVASTATIN CALCIUM 5 MG PO TABS
5.0000 mg | ORAL_TABLET | Freq: Every evening | ORAL | Status: DC
  Administered 2020-09-20: 5 mg via ORAL
  Filled 2020-09-20: qty 1

## 2020-09-20 MED ORDER — PROPOFOL 10 MG/ML IV BOLUS
INTRAVENOUS | Status: DC | PRN
  Administered 2020-09-20: 120 mg via INTRAVENOUS

## 2020-09-20 MED ORDER — BUPIVACAINE HCL (PF) 0.25 % IJ SOLN
INTRAMUSCULAR | Status: AC
  Filled 2020-09-20: qty 30

## 2020-09-20 MED ORDER — THROMBIN 20000 UNITS EX SOLR
CUTANEOUS | Status: AC
  Filled 2020-09-20: qty 20000

## 2020-09-20 MED ORDER — FENTANYL CITRATE (PF) 250 MCG/5ML IJ SOLN
INTRAMUSCULAR | Status: DC | PRN
Start: 2020-09-20 — End: 2020-09-20
  Administered 2020-09-20 (×2): 50 ug via INTRAVENOUS
  Administered 2020-09-20: 150 ug via INTRAVENOUS

## 2020-09-20 MED ORDER — ACETAMINOPHEN 325 MG PO TABS
650.0000 mg | ORAL_TABLET | ORAL | Status: DC | PRN
  Administered 2020-09-21: 650 mg via ORAL
  Filled 2020-09-20: qty 2

## 2020-09-20 MED ORDER — POTASSIUM CHLORIDE IN NACL 20-0.9 MEQ/L-% IV SOLN
INTRAVENOUS | Status: DC
  Filled 2020-09-20 (×2): qty 1000

## 2020-09-20 MED ORDER — HYDROCHLOROTHIAZIDE 25 MG PO TABS
25.0000 mg | ORAL_TABLET | Freq: Every day | ORAL | Status: DC
  Administered 2020-09-20 – 2020-09-21 (×2): 25 mg via ORAL
  Filled 2020-09-20 (×2): qty 1

## 2020-09-20 MED ORDER — SUCCINYLCHOLINE CHLORIDE 200 MG/10ML IV SOSY
PREFILLED_SYRINGE | INTRAVENOUS | Status: AC
  Filled 2020-09-20: qty 10

## 2020-09-20 MED ORDER — EPHEDRINE SULFATE-NACL 50-0.9 MG/10ML-% IV SOSY
PREFILLED_SYRINGE | INTRAVENOUS | Status: DC | PRN
  Administered 2020-09-20 (×2): 5 mg via INTRAVENOUS

## 2020-09-20 MED ORDER — PHENYLEPHRINE 40 MCG/ML (10ML) SYRINGE FOR IV PUSH (FOR BLOOD PRESSURE SUPPORT)
PREFILLED_SYRINGE | INTRAVENOUS | Status: AC
  Filled 2020-09-20: qty 10

## 2020-09-20 MED ORDER — SODIUM CHLORIDE 0.9 % IV SOLN: 250.0000 mL | INTRAVENOUS | Status: DC

## 2020-09-20 MED ORDER — MENTHOL 3 MG MT LOZG: 1.0000 | LOZENGE | OROMUCOSAL | Status: DC | PRN

## 2020-09-20 MED ORDER — VITAMIN E 45 MG (100 UNIT) PO CAPS
400.0000 [IU] | ORAL_CAPSULE | Freq: Every day | ORAL | Status: DC
  Administered 2020-09-20 – 2020-09-21 (×2): 400 [IU] via ORAL
  Filled 2020-09-20 (×2): qty 4

## 2020-09-20 MED ORDER — PHENOL 1.4 % MT LIQD: 1.0000 | OROMUCOSAL | Status: DC | PRN

## 2020-09-20 MED ORDER — PANTOPRAZOLE SODIUM 40 MG PO TBEC
40.0000 mg | DELAYED_RELEASE_TABLET | Freq: Every day | ORAL | Status: DC
  Administered 2020-09-21: 40 mg via ORAL
  Filled 2020-09-20: qty 1

## 2020-09-20 MED ORDER — ONDANSETRON HCL 4 MG PO TABS: 4.0000 mg | ORAL_TABLET | Freq: Four times a day (QID) | ORAL | Status: DC | PRN

## 2020-09-20 MED ORDER — SUGAMMADEX SODIUM 200 MG/2ML IV SOLN
INTRAVENOUS | Status: DC | PRN
  Administered 2020-09-20: 200 mg via INTRAVENOUS

## 2020-09-20 MED ORDER — PHENYLEPHRINE HCL-NACL 10-0.9 MG/250ML-% IV SOLN
INTRAVENOUS | Status: DC | PRN
  Administered 2020-09-20: 20 ug/min via INTRAVENOUS

## 2020-09-20 MED ORDER — ONDANSETRON HCL 4 MG/2ML IJ SOLN
INTRAMUSCULAR | Status: DC | PRN
  Administered 2020-09-20: 4 mg via INTRAVENOUS

## 2020-09-20 MED ORDER — LACTATED RINGERS IV SOLN: INTRAVENOUS | Status: DC

## 2020-09-20 MED ORDER — ZOLPIDEM TARTRATE 5 MG PO TABS: 5.0000 mg | ORAL_TABLET | Freq: Every evening | ORAL | Status: DC | PRN

## 2020-09-20 MED ORDER — BISACODYL 5 MG PO TBEC: 5.0000 mg | DELAYED_RELEASE_TABLET | Freq: Every day | ORAL | Status: DC | PRN

## 2020-09-20 MED ORDER — ROCURONIUM BROMIDE 10 MG/ML (PF) SYRINGE
PREFILLED_SYRINGE | INTRAVENOUS | Status: AC
  Filled 2020-09-20: qty 10

## 2020-09-20 MED ORDER — OXYCODONE HCL ER 10 MG PO T12A
10.0000 mg | EXTENDED_RELEASE_TABLET | Freq: Two times a day (BID) | ORAL | Status: DC
  Filled 2020-09-20 (×2): qty 1

## 2020-09-20 MED ORDER — PROSIGHT PO TABS
1.0000 | ORAL_TABLET | Freq: Two times a day (BID) | ORAL | Status: DC
  Administered 2020-09-20 – 2020-09-21 (×2): 1 via ORAL
  Filled 2020-09-20 (×2): qty 1

## 2020-09-20 MED ORDER — 0.9 % SODIUM CHLORIDE (POUR BTL) OPTIME
TOPICAL | Status: DC | PRN
  Administered 2020-09-20: 1000 mL

## 2020-09-20 MED ORDER — LIDOCAINE 2% (20 MG/ML) 5 ML SYRINGE
INTRAMUSCULAR | Status: DC | PRN
  Administered 2020-09-20: 100 mg via INTRAVENOUS

## 2020-09-20 MED ORDER — THROMBIN 5000 UNITS EX SOLR
CUTANEOUS | Status: AC
  Filled 2020-09-20: qty 5000

## 2020-09-20 MED ORDER — VITAMIN D3 25 MCG (1000 UNIT) PO TABS
1000.0000 [IU] | ORAL_TABLET | Freq: Every day | ORAL | Status: DC
  Administered 2020-09-20 – 2020-09-21 (×2): 1000 [IU] via ORAL
  Filled 2020-09-20 (×4): qty 1

## 2020-09-20 MED ORDER — LIDOCAINE-EPINEPHRINE 0.5 %-1:200000 IJ SOLN
INTRAMUSCULAR | Status: AC
  Filled 2020-09-20: qty 1

## 2020-09-20 MED ORDER — FENTANYL CITRATE (PF) 250 MCG/5ML IJ SOLN
INTRAMUSCULAR | Status: AC
  Filled 2020-09-20: qty 5

## 2020-09-20 MED ORDER — HYDROCODONE-ACETAMINOPHEN 7.5-325 MG PO TABS: 2.0000 | ORAL_TABLET | ORAL | Status: DC | PRN

## 2020-09-20 MED ORDER — ONDANSETRON HCL 4 MG/2ML IJ SOLN: 4.0000 mg | Freq: Once | INTRAMUSCULAR | Status: DC | PRN

## 2020-09-20 MED ORDER — KETOROLAC TROMETHAMINE 15 MG/ML IJ SOLN
7.5000 mg | Freq: Four times a day (QID) | INTRAMUSCULAR | Status: AC
  Administered 2020-09-20 – 2020-09-21 (×4): 7.5 mg via INTRAVENOUS
  Filled 2020-09-20 (×4): qty 1

## 2020-09-20 MED ORDER — ROCURONIUM BROMIDE 10 MG/ML (PF) SYRINGE
PREFILLED_SYRINGE | INTRAVENOUS | Status: DC | PRN
  Administered 2020-09-20: 100 mg via INTRAVENOUS
  Administered 2020-09-20: 10 mg via INTRAVENOUS

## 2020-09-20 MED ORDER — LOSARTAN POTASSIUM-HCTZ 100-25 MG PO TABS: 1.0000 | ORAL_TABLET | Freq: Every day | ORAL | Status: DC

## 2020-09-20 MED ORDER — DEXAMETHASONE SODIUM PHOSPHATE 10 MG/ML IJ SOLN
INTRAMUSCULAR | Status: AC
  Filled 2020-09-20: qty 2

## 2020-09-20 MED ORDER — PROPOFOL 10 MG/ML IV BOLUS
INTRAVENOUS | Status: AC
  Filled 2020-09-20: qty 20

## 2020-09-20 MED ORDER — BUPIVACAINE HCL (PF) 0.25 % IJ SOLN
INTRAMUSCULAR | Status: DC | PRN
  Administered 2020-09-20: 30 mL

## 2020-09-20 MED ORDER — CEFAZOLIN SODIUM-DEXTROSE 2-4 GM/100ML-% IV SOLN
2.0000 g | INTRAVENOUS | Status: AC
  Administered 2020-09-20: 2 g via INTRAVENOUS

## 2020-09-20 MED ORDER — PHENYLEPHRINE 40 MCG/ML (10ML) SYRINGE FOR IV PUSH (FOR BLOOD PRESSURE SUPPORT)
PREFILLED_SYRINGE | INTRAVENOUS | Status: DC | PRN
  Administered 2020-09-20: 120 ug via INTRAVENOUS
  Administered 2020-09-20: 80 ug via INTRAVENOUS
  Administered 2020-09-20: 40 ug via INTRAVENOUS
  Administered 2020-09-20: 120 ug via INTRAVENOUS

## 2020-09-20 MED ORDER — VALACYCLOVIR HCL 500 MG PO TABS: 1000.0000 mg | ORAL_TABLET | Freq: Two times a day (BID) | ORAL | Status: DC | PRN

## 2020-09-20 SURGICAL SUPPLY — 58 items
ADH SKN CLS APL DERMABOND .7 (GAUZE/BANDAGES/DRESSINGS) ×1
APL SKNCLS STERI-STRIP NONHPOA (GAUZE/BANDAGES/DRESSINGS)
BAND INSRT 18 STRL LF DISP RB (MISCELLANEOUS)
BAND RUBBER #18 3X1/16 STRL (MISCELLANEOUS) ×2 IMPLANT
BENZOIN TINCTURE PRP APPL 2/3 (GAUZE/BANDAGES/DRESSINGS) IMPLANT
BLADE CLIPPER SURG (BLADE) IMPLANT
BUR MATCHSTICK NEURO 3.0 LAGG (BURR) ×3 IMPLANT
BUR PRECISION FLUTE 5.0 (BURR) IMPLANT
CANISTER SUCT 3000ML PPV (MISCELLANEOUS) ×3 IMPLANT
CARTRIDGE OIL MAESTRO DRILL (MISCELLANEOUS) ×1 IMPLANT
CLOSURE WOUND 1/2 X4 (GAUZE/BANDAGES/DRESSINGS)
COVER WAND RF STERILE (DRAPES) ×3 IMPLANT
DECANTER SPIKE VIAL GLASS SM (MISCELLANEOUS) ×3 IMPLANT
DERMABOND ADVANCED (GAUZE/BANDAGES/DRESSINGS) ×2
DERMABOND ADVANCED .7 DNX12 (GAUZE/BANDAGES/DRESSINGS) ×1 IMPLANT
DIFFUSER DRILL AIR PNEUMATIC (MISCELLANEOUS) ×3 IMPLANT
DRAPE LAPAROTOMY 100X72X124 (DRAPES) ×3 IMPLANT
DRAPE MICROSCOPE LEICA (MISCELLANEOUS) ×1 IMPLANT
DRAPE SURG 17X23 STRL (DRAPES) ×3 IMPLANT
DURAPREP 26ML APPLICATOR (WOUND CARE) ×3 IMPLANT
ELECT REM PT RETURN 9FT ADLT (ELECTROSURGICAL) ×3
ELECTRODE REM PT RTRN 9FT ADLT (ELECTROSURGICAL) ×1 IMPLANT
GAUZE 4X4 16PLY RFD (DISPOSABLE) IMPLANT
GAUZE SPONGE 4X4 12PLY STRL (GAUZE/BANDAGES/DRESSINGS) IMPLANT
GAUZE SPONGE 4X4 16PLY XRAY LF (GAUZE/BANDAGES/DRESSINGS) ×2 IMPLANT
GLOVE BIO SURGEON STRL SZ 6.5 (GLOVE) ×1 IMPLANT
GLOVE BIO SURGEONS STRL SZ 6.5 (GLOVE) ×1
GLOVE BIOGEL PI IND STRL 7.0 (GLOVE) IMPLANT
GLOVE BIOGEL PI INDICATOR 7.0 (GLOVE) ×2
GLOVE ECLIPSE 6.5 STRL STRAW (GLOVE) ×3 IMPLANT
GLOVE EXAM NITRILE XL STR (GLOVE) IMPLANT
GOWN STRL REUS W/ TWL LRG LVL3 (GOWN DISPOSABLE) ×2 IMPLANT
GOWN STRL REUS W/ TWL XL LVL3 (GOWN DISPOSABLE) IMPLANT
GOWN STRL REUS W/TWL 2XL LVL3 (GOWN DISPOSABLE) IMPLANT
GOWN STRL REUS W/TWL LRG LVL3 (GOWN DISPOSABLE) ×6
GOWN STRL REUS W/TWL XL LVL3 (GOWN DISPOSABLE)
GRAFT DURAGEN MATRIX 2WX2L ×2 IMPLANT
KIT BASIN OR (CUSTOM PROCEDURE TRAY) ×3 IMPLANT
KIT TURNOVER KIT B (KITS) ×3 IMPLANT
NDL HYPO 25X1 1.5 SAFETY (NEEDLE) ×1 IMPLANT
NDL SPNL 18GX3.5 QUINCKE PK (NEEDLE) IMPLANT
NEEDLE HYPO 25X1 1.5 SAFETY (NEEDLE) ×3 IMPLANT
NEEDLE SPNL 18GX3.5 QUINCKE PK (NEEDLE) ×3 IMPLANT
NS IRRIG 1000ML POUR BTL (IV SOLUTION) ×3 IMPLANT
OIL CARTRIDGE MAESTRO DRILL (MISCELLANEOUS) ×3
PACK LAMINECTOMY NEURO (CUSTOM PROCEDURE TRAY) ×3 IMPLANT
PAD ARMBOARD 7.5X6 YLW CONV (MISCELLANEOUS) ×9 IMPLANT
SPONGE LAP 4X18 RFD (DISPOSABLE) IMPLANT
SPONGE SURGIFOAM ABS GEL 100 (HEMOSTASIS) ×3 IMPLANT
STRIP CLOSURE SKIN 1/2X4 (GAUZE/BANDAGES/DRESSINGS) IMPLANT
SUT PROLENE 6 0 BV (SUTURE) ×2 IMPLANT
SUT VIC AB 0 CT1 18XCR BRD8 (SUTURE) ×1 IMPLANT
SUT VIC AB 0 CT1 8-18 (SUTURE) ×3
SUT VIC AB 2-0 CT1 18 (SUTURE) ×3 IMPLANT
SUT VIC AB 3-0 SH 8-18 (SUTURE) ×3 IMPLANT
TOWEL GREEN STERILE (TOWEL DISPOSABLE) ×3 IMPLANT
TOWEL GREEN STERILE FF (TOWEL DISPOSABLE) ×3 IMPLANT
WATER STERILE IRR 1000ML POUR (IV SOLUTION) ×3 IMPLANT

## 2020-09-20 NOTE — Anesthesia Postprocedure Evaluation (Signed)
Anesthesia Post Note  Patient: [name]  Procedure(s) Performed: LUMBAR LAMINECTOMY/DECOMPRESSION MICRODISCECTOMY LUMBAR TWO TO LUMBAR FIVE (N/A )     Patient location during evaluation: PACU Anesthesia Type: General Level of consciousness: awake and alert Pain management: pain level controlled Vital Signs Assessment: post-procedure vital signs reviewed and stable Respiratory status: spontaneous breathing, nonlabored ventilation, respiratory function stable and patient connected to nasal cannula oxygen Cardiovascular status: blood pressure returned to baseline and stable Postop Assessment: no apparent nausea or vomiting Anesthetic complications: no   No complications documented.  Last Vitals:  Vitals:   09/20/20 1300 09/20/20 1330  BP: 129/70 (!) 143/69  Pulse: 76 78  Resp: 15 16  Temp:    SpO2: 94% 96%    Last Pain:  Vitals:   09/20/20 1245  TempSrc:   PainSc: 0-No pain                 Samyah Bilbo DAVID

## 2020-09-20 NOTE — H&P (Addendum)
BP (!) 149/63   Pulse 81   Temp 98.1 F (36.7 C) (Oral)   Resp 17   Ht 5\' 7"  (1.702 m)   Wt 97.5 kg   SpO2 94%   BMI 33.67 kg/m  [name] is a former patient of the group and was treated by Dr. Joya Salm, last seen in April 2016.  At that time, the documented listhesis at L3-4 was seen, identified, and he had done a myelogram.  But, she had improved after physical therapy, so he did not believe that any other treatment was warranted.  She comes in today stating that she has used a walker now for about 2 years and that is a standing walker.  She also says that she has dealt with this problem for many years, but it has gotten much worse over the last 2 years.  She has done physical therapy.  She has had extensive conservative treatment, but the pain in the lower extremities and the right hip especially has gotten worse.  She has no urinary or bowel incontinence.  She was hospitalized fairly recently for a severe bout of constipation.   Pain is 7/10.  She always feels worse at the end of the day and oftentimes is forced to bed well before what she would consider her normal night.  She takes gabapentin.  This does very little for her discomfort.  She has had acupuncture, she has had laser treatment; all of which have been of no great benefit with regard to her pain.     MEDICATIONS :  She also takes Aspirin, Losartan, Omeprazole, Valacyclovir, Vitamin D3, and Vitamin E.     ALLERGIES :  She has allergies to adhesive tape, Carisoprodol, Codeine, Morphine, Promethazine, and Sulfa medication.  In her registration packet, she states that Penicillin causes hives, Morphine just GI intolerance, and Cindamycin GI intolerance.     REVIEW OF SYSTEMS :  Positive for vision changes, seasonal allergy, shortness of breath, and arrhythmia.     PAST MEDICAL HISTORY :  Includes the deep venous thrombosis, arthritis, gastroesophageal reflux disease, hypertension, and chronic renal disease.  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  She is not pregnant.     SOCIAL HISTORY :  She does not smoke.  She does not use alcohol.  She is right handed.     PHYSICAL EXAMINATION :  She comes in walking with a walker, doing fairly well.  It does take her longer than usual to achieve a standing position from sitting.  She has 5/5 strength in the lower extremities, maybe some slight weakness with her left dorsal plantar flexes, but otherwise normal.  2+ reflexes at the knees, 1+ at the ankles; 2+ biceps, triceps, and brachioradialis.  Normal muscle tone and bulk.  Coordination is also normal.  Decreased hearing, right greater than left, to finger rub.  Pupils equal, round, and reactive to light.  Full extraocular movements.  Full visual fields.  She has symmetric facial movements.  Proprioception is intact.  Light touch is intact.     IMAGING :  MRI of the lumbar spine in October shows severe stenosis at L3-4.  It shows moderate to severe stenosis at L2-3 and at L4-5 she has severe right neural foraminal and lateral recess stenosis.  The cauda equina is normal.  The conus medullaris is normal.     ASSESSMENT AND PLAN :  I explained to [name] and her daughter that I do believe an  operation is in order.  I do believe that her pain, which she has had now for a number of years, is not going to improve as she has severe stenosis at 3-4 and bad enough at 2-3 really.  The right side may all be due to 4-5.  I am not going to fuse her.  So, complete release of the nerve roots is not feasible.  But, I think the improvement she will achieve with a simple decompression is more than enough.  The risks and benefits were explained; bleeding, infection, no relief, and need for further surgery.  She understands and wishes to proceed.

## 2020-09-20 NOTE — Progress Notes (Signed)
Orthopedic Tech Progress Note Patient Details:  [name] 01/19/1931 [id number] Fitted patient with LSO. Wit daughter at bedside Patient ID: [name], female   DOB: 11-Dec-1930, 86 y.o.   MRN: [id number]   Janit Pagan 09/20/2020, 5:24 PM

## 2020-09-20 NOTE — Transfer of Care (Signed)
Immediate Anesthesia Transfer of Care Note  Patient: [name]  Procedure(s) Performed: LUMBAR LAMINECTOMY/DECOMPRESSION MICRODISCECTOMY LUMBAR TWO TO LUMBAR FIVE (N/A )  Patient Location: PACU  Anesthesia Type:General  Level of Consciousness: awake, alert , oriented and patient cooperative  Airway & Oxygen Therapy: Patient Spontanous Breathing and Patient connected to nasal cannula oxygen  Post-op Assessment: Report given to RN and Post -op Vital signs reviewed and stable  Post vital signs: Reviewed and stable  Last Vitals:  Vitals Value Taken Time  BP 149/71 09/20/20 1213  Temp    Pulse 89 09/20/20 1216  Resp 15 09/20/20 1216  SpO2 96 % 09/20/20 1216  Vitals shown include unvalidated device data.  Last Pain:  Vitals:   09/20/20 0716  TempSrc:   PainSc: 0-No pain         Complications: No complications documented.

## 2020-09-20 NOTE — Op Note (Signed)
09/20/2020  4:49 PM  PATIENT:  [name]  86 y.o. female With severe stenosis and persistent pain in the right lower extremity. PRE-OPERATIVE DIAGNOSIS:  Lumbar stenosis L2/3,3/4, and 4/5 Spondylolisthesis L4/5  POST-OPERATIVE DIAGNOSIS:  same  PROCEDURE:  Procedure(s): L2 hemilaminectomy, L3 laminectomy, L4 hemilaminectomy  SURGEON: Surgeon(s): Ashok Pall, MD  ASSISTANTS:none  ANESTHESIA:   general  EBL:  Total I/O In: 1250 [I.V.:1000; IV Piggyback:250] Out: 200 [Blood:200]  BLOOD ADMINISTERED:none  CELL SAVER GIVEN:none  COUNT:per nursing  DRAINS: none   SPECIMEN:  No Specimen  DICTATION: [name] was taken to the operating room, intubated, and placed under a general anesthetic without difficulty. She was positioned prone on the wilson frame. Her back was prepped and draped in a sterile manner. I opened the incision with a 10 blade and dissected sharply to the thoracolumbar fascia. I exposed the lamina of L2,3,4,and 5 bilaterally.  I decompressed the spinal canal and the L2,3,4 nerve roots bilaterally, and the right L5 root. I used the drill and Kerrison punches to remove the bone and the ligamentum flavum giving the thecal sac space from L2 to L5. The ligament was hypertrophic and compressing the thecal sac.  I irrigated then closed the wound being satisfied with the decompression.   PLAN OF CARE: Admit for overnight observation  PATIENT DISPOSITION:  PACU - hemodynamically stable.   Delay start of Pharmacological VTE agent (>24hrs) due to surgical blood loss or risk of bleeding:  yes

## 2020-09-20 NOTE — Anesthesia Procedure Notes (Signed)
Procedure Name: Intubation Date/Time: 09/20/2020 9:13 AM Performed by: Candis Shine, CRNA Pre-anesthesia Checklist: Patient identified, Emergency Drugs available, Suction available and Patient being monitored Patient Re-evaluated:Patient Re-evaluated prior to induction Oxygen Delivery Method: Circle system utilized Preoxygenation: Pre-oxygenation with 100% oxygen Induction Type: IV induction Ventilation: Mask ventilation without difficulty and Oral airway inserted - appropriate to patient size Laryngoscope Size: Mac and 3 Grade View: Grade I Tube type: Oral Tube size: 7.5 mm Number of attempts: 1 Airway Equipment and Method: Stylet and Oral airway Placement Confirmation: ETT inserted through vocal cords under direct vision,  positive ETCO2 and breath sounds checked- equal and bilateral Secured at: 21 cm Tube secured with: Tape Dental Injury: Teeth and Oropharynx as per pre-operative assessment

## 2020-09-21 DIAGNOSIS — M48062 Spinal stenosis, lumbar region with neurogenic claudication: Secondary | ICD-10-CM | POA: Diagnosis not present

## 2020-09-21 DIAGNOSIS — I129 Hypertensive chronic kidney disease with stage 1 through stage 4 chronic kidney disease, or unspecified chronic kidney disease: Secondary | ICD-10-CM | POA: Diagnosis not present

## 2020-09-21 DIAGNOSIS — Z96653 Presence of artificial knee joint, bilateral: Secondary | ICD-10-CM | POA: Diagnosis not present

## 2020-09-21 DIAGNOSIS — R7303 Prediabetes: Secondary | ICD-10-CM | POA: Diagnosis not present

## 2020-09-21 DIAGNOSIS — Z79899 Other long term (current) drug therapy: Secondary | ICD-10-CM | POA: Diagnosis not present

## 2020-09-21 DIAGNOSIS — Z7982 Long term (current) use of aspirin: Secondary | ICD-10-CM | POA: Diagnosis not present

## 2020-09-21 DIAGNOSIS — N183 Chronic kidney disease, stage 3 unspecified: Secondary | ICD-10-CM | POA: Diagnosis not present

## 2020-09-21 MED ORDER — TIZANIDINE HCL 4 MG PO TABS: 4.0000 mg | ORAL_TABLET | Freq: Four times a day (QID) | ORAL | 0 refills | Status: DC | PRN

## 2020-09-21 MED ORDER — HYDROCODONE-ACETAMINOPHEN 5-325 MG PO TABS
1.0000 | ORAL_TABLET | Freq: Four times a day (QID) | ORAL | 0 refills | Status: AC | PRN
Start: 2020-09-21 — End: 2020-09-29

## 2020-09-21 NOTE — Progress Notes (Signed)
No foley catheter in place at start of shift 1901 09/20/2020.

## 2020-09-21 NOTE — Discharge Instructions (Signed)
Lumbar Laminectomy Care After A laminectomy is an operation performed on the spine. The purpose is to decompress the spinal cord and/or the nerve roots.  The time in surgery depends on the findings in surgery and what is necessary to correct the problems. HOME CARE INSTRUCTIONS   Check the cut (incision) made by the surgeon twice a day for signs of infection. Some signs of infection may include:   A foul smelling, greenish or yellowish discharge from the wound.   Increased pain.   Increased redness over the incision (operative) site.   The skin edges may separate.   Flu-like symptoms (problems).   A temperature above 101.5 F (38.6 C).   Change your bandages in about 24 to 36 hours following surgery or as directed.   You may shower tomorrow. Avoid bathtubs, swimming pools and hot tubs for three weeks or until your incision has healed completely. If you have stitches or staples, they may be removed 2 to 3 weeks after surgery, or as directed by your doctor. This may be done by your doctor or caregiver.   You may walk as much as you like. No need to exercise at this time. Limit lifting to ~10lbs.  Weight reduction may be beneficial if you are overweight.   Daily exercise is helpful to prevent the return of problems. Walking is permitted. You may use a treadmill without an incline. Cut down on activities and exercise if you have discomfort. You may also go up and down stairs as much as you can tolerate.   DO NOT lift anything heavier than 10 . Avoid bending or twisting at the waist. Always bend your knees when lifting.   Maintain strength and range of motion as instructed.   Do not drive for 2 to 3 weeks, or as directed by your doctors. You may be a passenger for 20 to 30 minute trips. Lying back in the passenger seat may be more comfortable for you. Always wear a seatbelt.   Limit your sitting in a regular chair to 20 to 30 minutes at a time. There are no limitations for sitting in a  recliner. You should lie down or walk in between sitting periods.   Only take over-the-counter or prescription medicines for pain, discomfort, or fever as directed by your caregiver.  SEEK MEDICAL CARE IF:   There is increased bleeding (more than a small spot) from the wound.   You notice redness, swelling, or increasing pain in the wound.   Pus is coming from wound.   You develop an unexplained oral temperature above 102 F (38.9 C) develops.   You notice a foul smell coming from the wound or dressing.   You have increasing pain in your wound.  SEEK IMMEDIATE MEDICAL CARE IF:   You develop a rash.   You have difficulty breathing.   You develop any allergic problems to medicines given.   

## 2020-09-21 NOTE — Plan of Care (Signed)
Problem: Education: Goal: Knowledge of General Education information will improve Description: Including pain rating scale, medication(s)/side effects and non-pharmacologic comfort measures 09/21/2020 0227 by Lennox Grumbles, RN Outcome: Progressing 09/21/2020 0227 by Lennox Grumbles, RN Outcome: Progressing   Problem: Health Behavior/Discharge Planning: Goal: Ability to manage health-related needs will improve 09/21/2020 0227 by Lennox Grumbles, RN Outcome: Progressing 09/21/2020 0227 by Lennox Grumbles, RN Outcome: Progressing   Problem: Clinical Measurements: Goal: Ability to maintain clinical measurements within normal limits will improve 09/21/2020 0227 by Lennox Grumbles, RN Outcome: Progressing 09/21/2020 0227 by Lennox Grumbles, RN Outcome: Progressing Goal: Will remain free from infection 09/21/2020 0227 by Lennox Grumbles, RN Outcome: Progressing 09/21/2020 0227 by Lennox Grumbles, RN Outcome: Progressing Goal: Diagnostic test results will improve 09/21/2020 0227 by Lennox Grumbles, RN Outcome: Progressing 09/21/2020 0227 by Lennox Grumbles, RN Outcome: Progressing Goal: Respiratory complications will improve 09/21/2020 0227 by Lennox Grumbles, RN Outcome: Progressing 09/21/2020 0227 by Lennox Grumbles, RN Outcome: Progressing Goal: Cardiovascular complication will be avoided 09/21/2020 0227 by Lennox Grumbles, RN Outcome: Progressing 09/21/2020 0227 by Lennox Grumbles, RN Outcome: Progressing   Problem: Activity: Goal: Risk for activity intolerance will decrease 09/21/2020 0227 by Lennox Grumbles, RN Outcome: Progressing 09/21/2020 0227 by Lennox Grumbles, RN Outcome: Progressing   Problem: Nutrition: Goal: Adequate nutrition will be maintained 09/21/2020 0227 by Lennox Grumbles, RN Outcome: Progressing 09/21/2020 0227 by Lennox Grumbles, RN Outcome: Progressing   Problem: Coping: Goal: Level of anxiety will decrease 09/21/2020 0227 by Lennox Grumbles, RN Outcome:  Progressing 09/21/2020 0227 by Lennox Grumbles, RN Outcome: Progressing   Problem: Elimination: Goal: Will not experience complications related to bowel motility 09/21/2020 0227 by Lennox Grumbles, RN Outcome: Progressing 09/21/2020 0227 by Lennox Grumbles, RN Outcome: Progressing Goal: Will not experience complications related to urinary retention 09/21/2020 0227 by Lennox Grumbles, RN Outcome: Progressing 09/21/2020 0227 by Lennox Grumbles, RN Outcome: Progressing   Problem: Pain Managment: Goal: General experience of comfort will improve 09/21/2020 0227 by Lennox Grumbles, RN Outcome: Progressing 09/21/2020 0227 by Lennox Grumbles, RN Outcome: Progressing   Problem: Safety: Goal: Ability to remain free from injury will improve 09/21/2020 0227 by Lennox Grumbles, RN Outcome: Progressing 09/21/2020 0227 by Lennox Grumbles, RN Outcome: Progressing   Problem: Skin Integrity: Goal: Risk for impaired skin integrity will decrease 09/21/2020 0227 by Lennox Grumbles, RN Outcome: Progressing 09/21/2020 0227 by Lennox Grumbles, RN Outcome: Progressing   Problem: Education: Goal: Ability to verbalize activity precautions or restrictions will improve Outcome: Progressing Goal: Knowledge of the prescribed therapeutic regimen will improve Outcome: Progressing Goal: Understanding of discharge needs will improve Outcome: Progressing   Problem: Activity: Goal: Ability to avoid complications of mobility impairment will improve Outcome: Progressing Goal: Ability to tolerate increased activity will improve Outcome: Progressing Goal: Will remain free from falls Outcome: Progressing   Problem: Bowel/Gastric: Goal: Gastrointestinal status for postoperative course will improve Outcome: Progressing   Problem: Clinical Measurements: Goal: Ability to maintain clinical measurements within normal limits will improve Outcome: Progressing Goal: Postoperative complications will be avoided or  minimized Outcome: Progressing Goal: Diagnostic test results will improve Outcome: Progressing   Problem: Pain Management: Goal: Pain level will decrease Outcome: Progressing   Problem: Skin Integrity: Goal: Will show signs of wound healing Outcome: Progressing   Problem: Health Behavior/Discharge Planning: Goal: Identification of resources available to assist in meeting health care  needs will improve Outcome: Progressing   Problem: Bladder/Genitourinary: Goal: Urinary functional status for postoperative course will improve Outcome: Progressing

## 2020-09-21 NOTE — Discharge Summary (Signed)
Physician Discharge Summary  Patient ID: [name] MRN: [id number] DOB/AGE: [birth year] 86 y.o.  Admit date: 09/20/2020 Discharge date: 09/21/2020  Admission Diagnoses:lumbar stenosis L2-5  Discharge Diagnoses: same Active Problems:   Lumbar stenosis with neurogenic claudication   Discharged Condition: good  Hospital Course: [name] was admitted and taken to the operating room for an uncomplicated lumbar decompression. Post op she is ambulating, voiding, and tolerating a regular diet. The dura was denuded and there was some spinal fluid leaking from the area. Did not believe the dura would actually hold a stitch, area covered with duragen.  Wound is clean, and dry today, also flat.   Treatments: surgery: L2 hemilaminectomy, L3 laminectomy, L4 hemilaminectomy  Discharge Exam: Blood pressure 131/71, pulse 73, temperature 97.7 F (36.5 C), temperature source Oral, resp. rate 18, height 5\' 7"  (1.702 m), weight 97.5 kg, SpO2 95 %. General appearance: alert, cooperative, appears stated age and no distress  Disposition: Discharge disposition: 01-Home or Self Care      SPINDOLOLISTHESIS  Allergies as of 09/21/2020      Reactions   Adhesive [tape] Other (See Comments)   PEELS OFF THE SKIN!!   Clindamycin/lincomycin Nausea And Vomiting   Codeine Nausea Only   Dilaudid [hydromorphone Hcl] Nausea And Vomiting   Fenofibrate Other (See Comments)   Reaction not recalled   Morphine And Related Nausea And Vomiting   Sulfa Antibiotics Hives   Sulfur Hives, Other (See Comments)   Should this be "Sulfa" (??)      Medication List    TAKE these medications   gabapentin 100 MG capsule Commonly known as: NEURONTIN Take 200 mg by mouth in the morning, at noon, and at bedtime.   HYDROcodone-acetaminophen 5-325 MG tablet Commonly known as: NORCO/VICODIN Take 1 tablet by mouth every 6 (six) hours as needed for up to 8 days for moderate pain.   losartan-hydrochlorothiazide 100-25  MG tablet Commonly known as: HYZAAR Take 1 tablet by mouth daily.   naproxen sodium 220 MG tablet Commonly known as: ALEVE Take 220-440 mg by mouth 2 (two) times daily as needed (for headaches or pain).   omeprazole 20 MG tablet Commonly known as: PRILOSEC OTC Take 20 mg by mouth daily before breakfast.   PRESERVISION AREDS PO Take 1 capsule by mouth in the morning and at bedtime.   rosuvastatin 5 MG tablet Commonly known as: CRESTOR Take 1 tablet (5 mg total) by mouth daily. What changed: when to take this   tiZANidine 4 MG tablet Commonly known as: ZANAFLEX Take 1 tablet (4 mg total) by mouth every 6 (six) hours as needed for muscle spasms.   Valtrex 1000 MG tablet Generic drug: valACYclovir Take 1,000 mg by mouth 2 (two) times daily as needed (outbreaks).   Vitamin D3 25 MCG (1000 UT) Caps Take 1,000 Units by mouth daily.   vitamin E 180 MG (400 UNITS) capsule Take 400 Units by mouth daily.       Follow-up Information    Ashok Pall, MD Follow up in 3 week(s).   Specialty: Neurosurgery Why: please call the office to make an appointment Contact information: [address] [place] [address] [place] [address] [phone]               Signed: Ashok Pall 09/21/2020, 9:05 AM

## 2020-09-21 NOTE — Progress Notes (Signed)
Discharge instructions given to patient and daughter who both verbalize full understanding of instructions. Pt discharged wearing her back brace. Pt ambulating with walker in room without difficulty. IV removed with catheter in tact. No bleeding noted at time of discharge. Pt transported to private vehicle via wheelchair.

## 2020-09-23 ENCOUNTER — Encounter (HOSPITAL_COMMUNITY): Payer: Self-pay | Admitting: Neurosurgery

## 2020-10-02 DIAGNOSIS — Z20822 Contact with and (suspected) exposure to covid-19: Secondary | ICD-10-CM | POA: Diagnosis not present

## 2020-10-02 DIAGNOSIS — J019 Acute sinusitis, unspecified: Secondary | ICD-10-CM | POA: Diagnosis not present

## 2020-10-04 ENCOUNTER — Other Ambulatory Visit: Payer: Medicare HMO | Admitting: *Deleted

## 2020-10-04 ENCOUNTER — Other Ambulatory Visit: Payer: Self-pay

## 2020-10-04 DIAGNOSIS — I10 Essential (primary) hypertension: Secondary | ICD-10-CM

## 2020-10-04 DIAGNOSIS — E782 Mixed hyperlipidemia: Secondary | ICD-10-CM | POA: Diagnosis not present

## 2020-10-04 LAB — HEPATIC FUNCTION PANEL
ALT: 14 IU/L (ref 0–32)
AST: 16 IU/L (ref 0–40)
Albumin: 3.9 g/dL (ref 3.6–4.6)
Alkaline Phosphatase: 137 IU/L — ABNORMAL HIGH (ref 44–121)
Bilirubin Total: 0.3 mg/dL (ref 0.0–1.2)
Bilirubin, Direct: 0.11 mg/dL (ref 0.00–0.40)
Total Protein: 6.7 g/dL (ref 6.0–8.5)

## 2020-10-04 LAB — LIPID PANEL
Chol/HDL Ratio: 2.6 ratio (ref 0.0–4.4)
Cholesterol, Total: 122 mg/dL (ref 100–199)
HDL: 47 mg/dL (ref >39)
LDL Chol Calc (NIH): 47 mg/dL (ref 0–99)
Triglycerides: 168 mg/dL — ABNORMAL HIGH (ref 0–149)
VLDL Cholesterol Cal: 28 mg/dL (ref 5–40)

## 2020-10-29 DIAGNOSIS — H40013 Open angle with borderline findings, low risk, bilateral: Secondary | ICD-10-CM | POA: Diagnosis not present

## 2020-11-08 IMAGING — CR DG HIP (WITH OR WITHOUT PELVIS) 2-3V LEFT
3 series · 3 of 3 positions shown · non-contrast
Comparison: None.

CLINICAL DATA: Left hip pain

EXAM:
DG HIP (WITH OR WITHOUT PELVIS) 2-3V LEFT

[t pelvis ap]
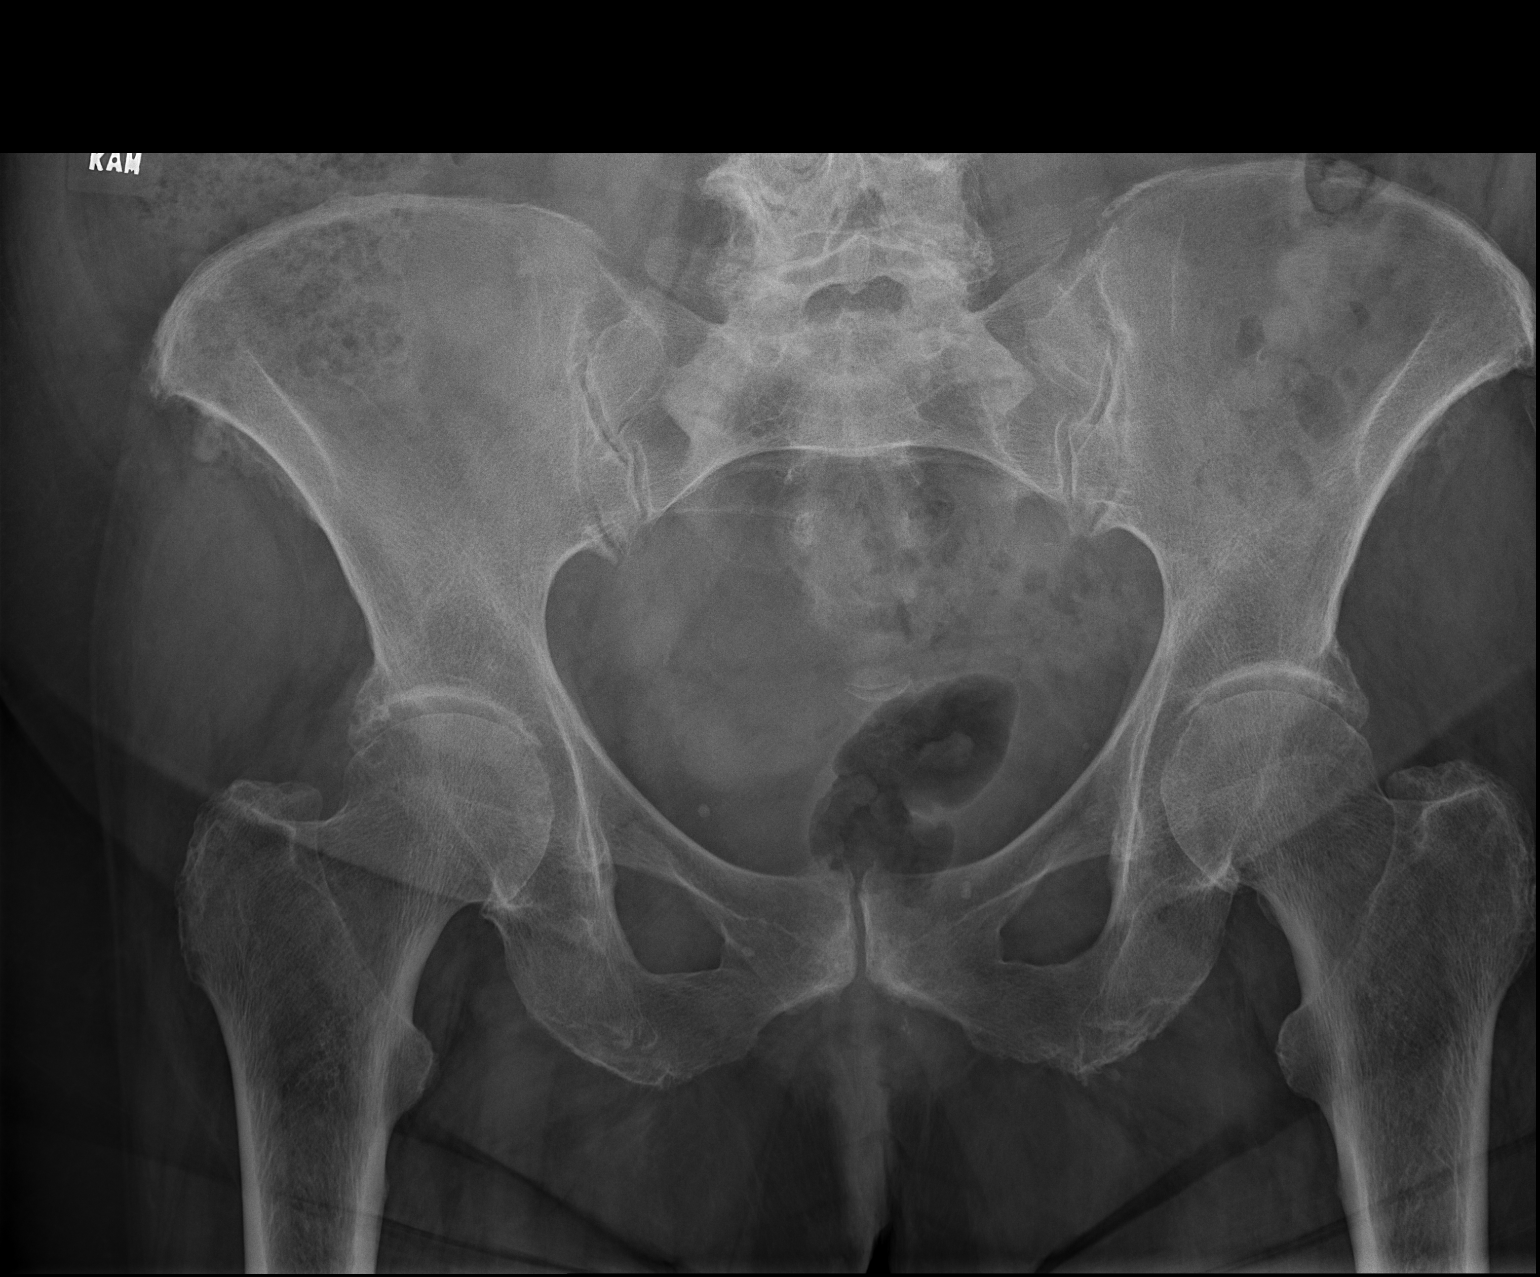

[t hip frog left (1 of 2)]
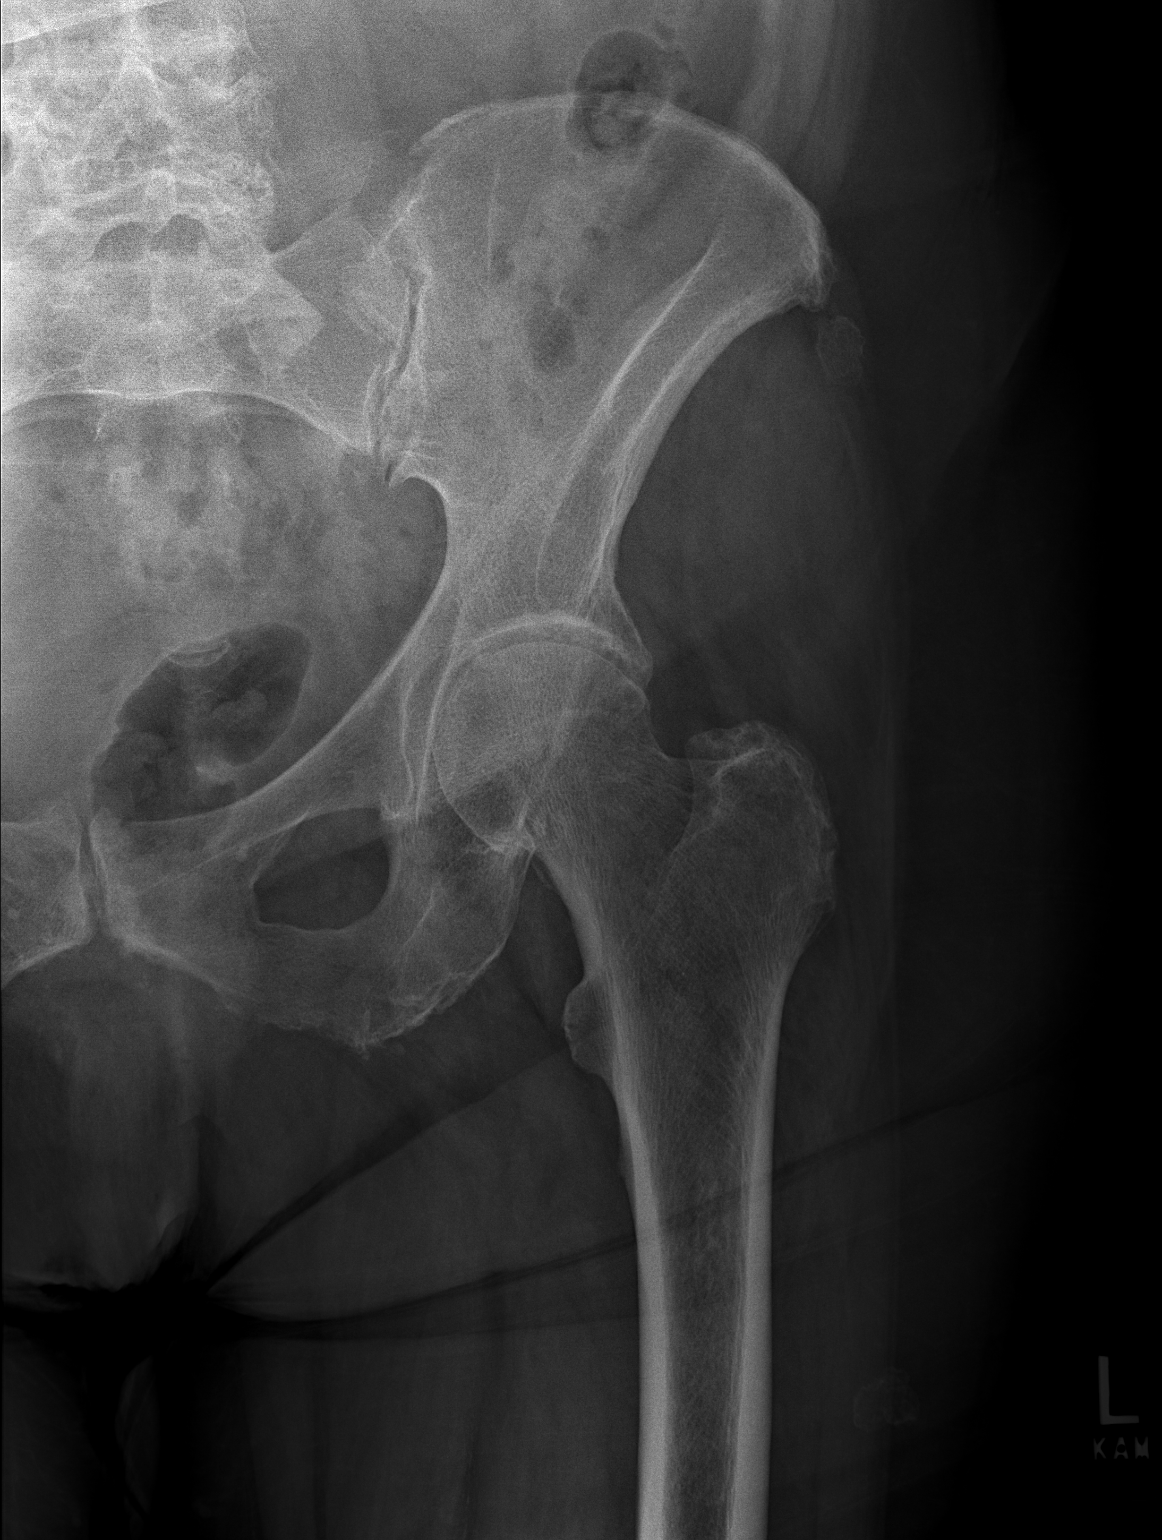

[t hip frog left (2 of 2)]
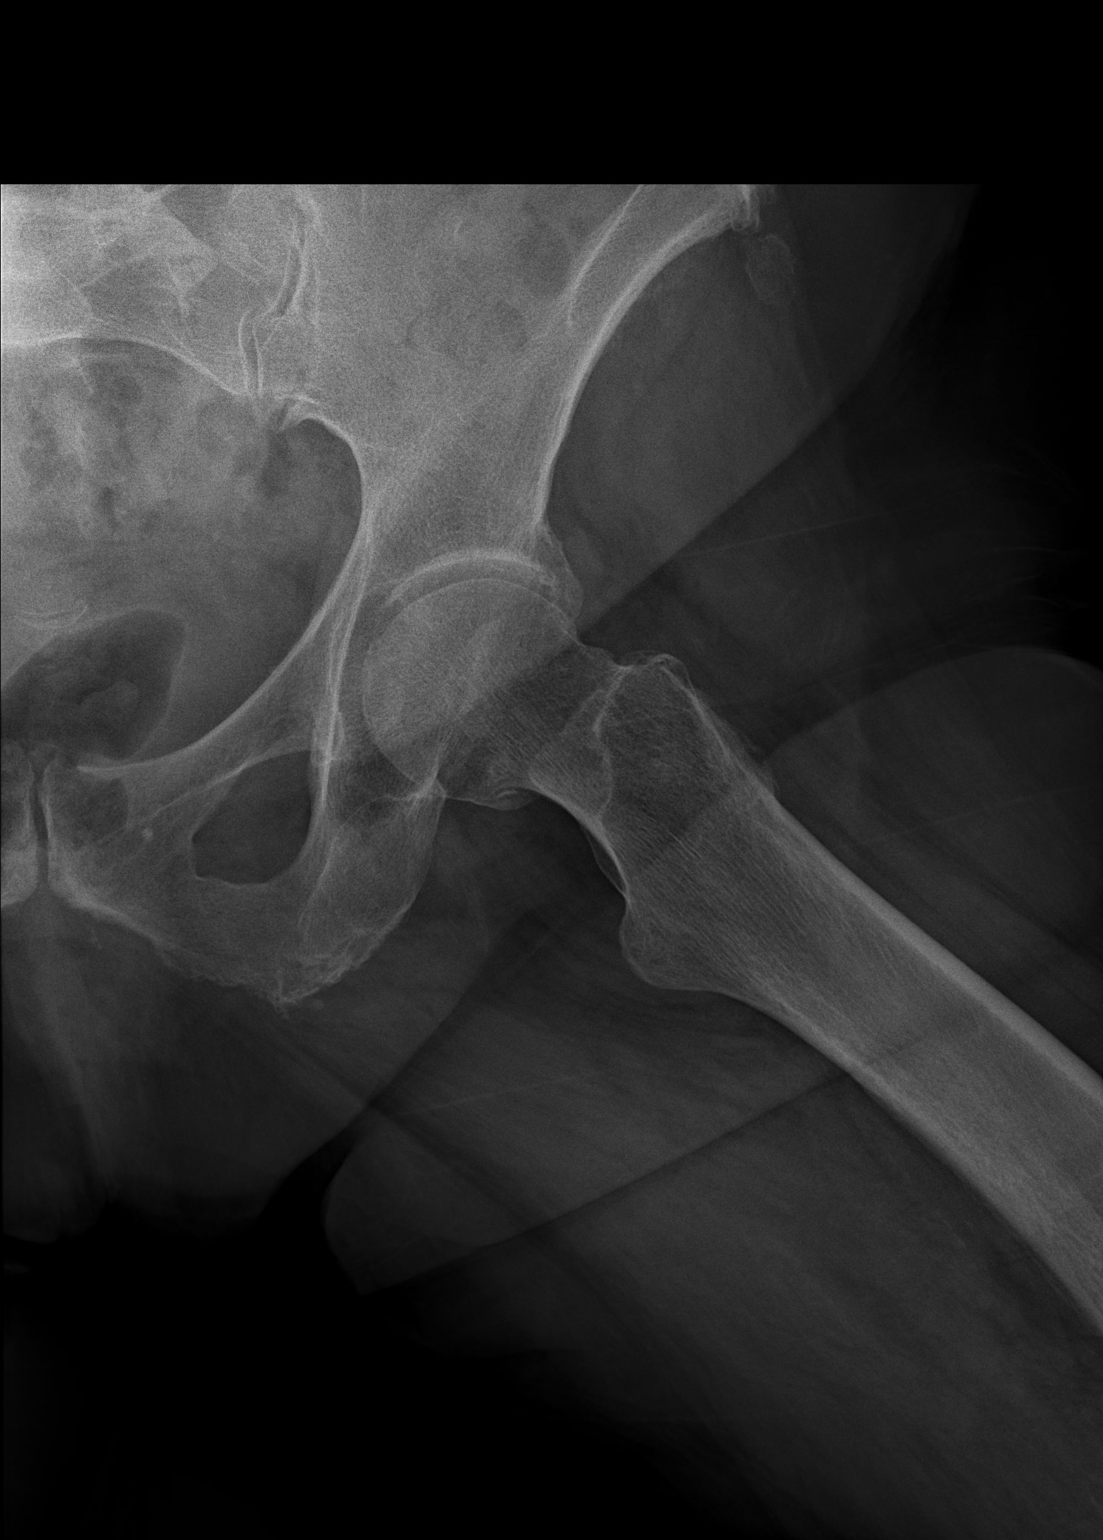

[3 of 3 positions shown; findings below may reference images not displayed]

FINDINGS: Mild degenerative change in the left hip joint. No fracture or
dislocation. No focal bony lesion. Lumbar scoliosis and degenerative
change.
IMPRESSION: Mild degenerative change left hip.  No acute abnormality.

## 2020-11-11 DIAGNOSIS — Z1231 Encounter for screening mammogram for malignant neoplasm of breast: Secondary | ICD-10-CM | POA: Diagnosis not present

## 2020-12-27 DIAGNOSIS — R7303 Prediabetes: Secondary | ICD-10-CM | POA: Diagnosis not present

## 2020-12-27 DIAGNOSIS — N1831 Chronic kidney disease, stage 3a: Secondary | ICD-10-CM | POA: Diagnosis not present

## 2020-12-27 DIAGNOSIS — Z79899 Other long term (current) drug therapy: Secondary | ICD-10-CM | POA: Diagnosis not present

## 2020-12-27 DIAGNOSIS — E782 Mixed hyperlipidemia: Secondary | ICD-10-CM | POA: Diagnosis not present

## 2021-01-01 DIAGNOSIS — G5602 Carpal tunnel syndrome, left upper limb: Secondary | ICD-10-CM | POA: Diagnosis not present

## 2021-01-01 DIAGNOSIS — N3943 Post-void dribbling: Secondary | ICD-10-CM | POA: Diagnosis not present

## 2021-01-01 DIAGNOSIS — R140 Abdominal distension (gaseous): Secondary | ICD-10-CM | POA: Diagnosis not present

## 2021-01-01 DIAGNOSIS — N1831 Chronic kidney disease, stage 3a: Secondary | ICD-10-CM | POA: Diagnosis not present

## 2021-01-01 DIAGNOSIS — M48061 Spinal stenosis, lumbar region without neurogenic claudication: Secondary | ICD-10-CM | POA: Diagnosis not present

## 2021-01-01 DIAGNOSIS — E782 Mixed hyperlipidemia: Secondary | ICD-10-CM | POA: Diagnosis not present

## 2021-01-01 DIAGNOSIS — I700 Atherosclerosis of aorta: Secondary | ICD-10-CM | POA: Diagnosis not present

## 2021-01-01 DIAGNOSIS — I10 Essential (primary) hypertension: Secondary | ICD-10-CM | POA: Diagnosis not present

## 2021-01-01 DIAGNOSIS — R7303 Prediabetes: Secondary | ICD-10-CM | POA: Diagnosis not present

## 2021-01-08 ENCOUNTER — Telehealth: Payer: Self-pay | Admitting: Physician Assistant

## 2021-01-08 NOTE — Telephone Encounter (Signed)
Labs from PCP reviewed.   12/27/20: Trig 205, Total chol 139, HDL 52, LDL 84, SCr 0.94, K 4.6, ALT 11  PLAN:  LDL at goal. Triglycerides are elevated and should be lower.   Renal function, K+, LFTs normal. Work on diet to lower triglycerides.  Continue current meds. Richardson Dopp, PA-C    01/08/2021 11:05 PM

## 2021-01-09 DIAGNOSIS — Z0000 Encounter for general adult medical examination without abnormal findings: Secondary | ICD-10-CM | POA: Diagnosis not present

## 2021-01-09 DIAGNOSIS — Z9181 History of falling: Secondary | ICD-10-CM | POA: Diagnosis not present

## 2021-01-09 DIAGNOSIS — E785 Hyperlipidemia, unspecified: Secondary | ICD-10-CM | POA: Diagnosis not present

## 2021-01-09 DIAGNOSIS — E669 Obesity, unspecified: Secondary | ICD-10-CM | POA: Diagnosis not present

## 2021-01-09 DIAGNOSIS — Z1331 Encounter for screening for depression: Secondary | ICD-10-CM | POA: Diagnosis not present

## 2021-01-09 NOTE — Telephone Encounter (Signed)
Spoke with pt and made her aware of results and recommendations.

## 2021-01-29 DIAGNOSIS — M1612 Unilateral primary osteoarthritis, left hip: Secondary | ICD-10-CM | POA: Diagnosis not present

## 2021-01-29 DIAGNOSIS — M48061 Spinal stenosis, lumbar region without neurogenic claudication: Secondary | ICD-10-CM | POA: Diagnosis not present

## 2021-01-29 DIAGNOSIS — M79671 Pain in right foot: Secondary | ICD-10-CM | POA: Diagnosis not present

## 2021-01-29 DIAGNOSIS — G629 Polyneuropathy, unspecified: Secondary | ICD-10-CM | POA: Diagnosis not present

## 2021-02-04 DIAGNOSIS — M1612 Unilateral primary osteoarthritis, left hip: Secondary | ICD-10-CM | POA: Diagnosis not present

## 2021-02-04 DIAGNOSIS — G629 Polyneuropathy, unspecified: Secondary | ICD-10-CM | POA: Diagnosis not present

## 2021-02-04 DIAGNOSIS — M79671 Pain in right foot: Secondary | ICD-10-CM | POA: Diagnosis not present

## 2021-02-04 DIAGNOSIS — M48061 Spinal stenosis, lumbar region without neurogenic claudication: Secondary | ICD-10-CM | POA: Diagnosis not present

## 2021-02-06 DIAGNOSIS — G629 Polyneuropathy, unspecified: Secondary | ICD-10-CM | POA: Diagnosis not present

## 2021-02-06 DIAGNOSIS — M1612 Unilateral primary osteoarthritis, left hip: Secondary | ICD-10-CM | POA: Diagnosis not present

## 2021-02-06 DIAGNOSIS — M79671 Pain in right foot: Secondary | ICD-10-CM | POA: Diagnosis not present

## 2021-02-06 DIAGNOSIS — M48061 Spinal stenosis, lumbar region without neurogenic claudication: Secondary | ICD-10-CM | POA: Diagnosis not present

## 2021-02-11 DIAGNOSIS — R3915 Urgency of urination: Secondary | ICD-10-CM | POA: Diagnosis not present

## 2021-02-11 DIAGNOSIS — R3121 Asymptomatic microscopic hematuria: Secondary | ICD-10-CM | POA: Diagnosis not present

## 2021-02-11 DIAGNOSIS — D49519 Neoplasm of unspecified behavior of unspecified kidney: Secondary | ICD-10-CM | POA: Diagnosis not present

## 2021-02-13 DIAGNOSIS — M1612 Unilateral primary osteoarthritis, left hip: Secondary | ICD-10-CM | POA: Diagnosis not present

## 2021-02-13 DIAGNOSIS — M79671 Pain in right foot: Secondary | ICD-10-CM | POA: Diagnosis not present

## 2021-02-13 DIAGNOSIS — M48061 Spinal stenosis, lumbar region without neurogenic claudication: Secondary | ICD-10-CM | POA: Diagnosis not present

## 2021-02-13 DIAGNOSIS — G629 Polyneuropathy, unspecified: Secondary | ICD-10-CM | POA: Diagnosis not present

## 2021-02-19 DIAGNOSIS — M1612 Unilateral primary osteoarthritis, left hip: Secondary | ICD-10-CM | POA: Diagnosis not present

## 2021-02-19 DIAGNOSIS — G629 Polyneuropathy, unspecified: Secondary | ICD-10-CM | POA: Diagnosis not present

## 2021-02-19 DIAGNOSIS — M48061 Spinal stenosis, lumbar region without neurogenic claudication: Secondary | ICD-10-CM | POA: Diagnosis not present

## 2021-02-19 DIAGNOSIS — M79671 Pain in right foot: Secondary | ICD-10-CM | POA: Diagnosis not present

## 2021-02-21 DIAGNOSIS — M48061 Spinal stenosis, lumbar region without neurogenic claudication: Secondary | ICD-10-CM | POA: Diagnosis not present

## 2021-02-21 DIAGNOSIS — M1612 Unilateral primary osteoarthritis, left hip: Secondary | ICD-10-CM | POA: Diagnosis not present

## 2021-02-21 DIAGNOSIS — M79671 Pain in right foot: Secondary | ICD-10-CM | POA: Diagnosis not present

## 2021-02-21 DIAGNOSIS — G629 Polyneuropathy, unspecified: Secondary | ICD-10-CM | POA: Diagnosis not present

## 2021-02-25 DIAGNOSIS — M48061 Spinal stenosis, lumbar region without neurogenic claudication: Secondary | ICD-10-CM | POA: Diagnosis not present

## 2021-02-25 DIAGNOSIS — M79671 Pain in right foot: Secondary | ICD-10-CM | POA: Diagnosis not present

## 2021-02-25 DIAGNOSIS — M1612 Unilateral primary osteoarthritis, left hip: Secondary | ICD-10-CM | POA: Diagnosis not present

## 2021-02-25 DIAGNOSIS — G629 Polyneuropathy, unspecified: Secondary | ICD-10-CM | POA: Diagnosis not present

## 2021-02-27 DIAGNOSIS — M1612 Unilateral primary osteoarthritis, left hip: Secondary | ICD-10-CM | POA: Diagnosis not present

## 2021-02-27 DIAGNOSIS — M48061 Spinal stenosis, lumbar region without neurogenic claudication: Secondary | ICD-10-CM | POA: Diagnosis not present

## 2021-02-27 DIAGNOSIS — M79671 Pain in right foot: Secondary | ICD-10-CM | POA: Diagnosis not present

## 2021-02-27 DIAGNOSIS — G629 Polyneuropathy, unspecified: Secondary | ICD-10-CM | POA: Diagnosis not present

## 2021-03-04 DIAGNOSIS — G629 Polyneuropathy, unspecified: Secondary | ICD-10-CM | POA: Diagnosis not present

## 2021-03-04 DIAGNOSIS — M48061 Spinal stenosis, lumbar region without neurogenic claudication: Secondary | ICD-10-CM | POA: Diagnosis not present

## 2021-03-04 DIAGNOSIS — M1612 Unilateral primary osteoarthritis, left hip: Secondary | ICD-10-CM | POA: Diagnosis not present

## 2021-03-04 DIAGNOSIS — M79671 Pain in right foot: Secondary | ICD-10-CM | POA: Diagnosis not present

## 2021-03-05 DIAGNOSIS — R3121 Asymptomatic microscopic hematuria: Secondary | ICD-10-CM | POA: Diagnosis not present

## 2021-03-05 DIAGNOSIS — D7389 Other diseases of spleen: Secondary | ICD-10-CM | POA: Diagnosis not present

## 2021-03-05 DIAGNOSIS — N289 Disorder of kidney and ureter, unspecified: Secondary | ICD-10-CM | POA: Diagnosis not present

## 2021-03-05 DIAGNOSIS — D734 Cyst of spleen: Secondary | ICD-10-CM | POA: Diagnosis not present

## 2021-03-05 DIAGNOSIS — R3129 Other microscopic hematuria: Secondary | ICD-10-CM | POA: Diagnosis not present

## 2021-03-06 DIAGNOSIS — M79671 Pain in right foot: Secondary | ICD-10-CM | POA: Diagnosis not present

## 2021-03-06 DIAGNOSIS — G629 Polyneuropathy, unspecified: Secondary | ICD-10-CM | POA: Diagnosis not present

## 2021-03-06 DIAGNOSIS — M48061 Spinal stenosis, lumbar region without neurogenic claudication: Secondary | ICD-10-CM | POA: Diagnosis not present

## 2021-03-06 DIAGNOSIS — M1612 Unilateral primary osteoarthritis, left hip: Secondary | ICD-10-CM | POA: Diagnosis not present

## 2021-03-11 DIAGNOSIS — M79671 Pain in right foot: Secondary | ICD-10-CM | POA: Diagnosis not present

## 2021-03-11 DIAGNOSIS — M1612 Unilateral primary osteoarthritis, left hip: Secondary | ICD-10-CM | POA: Diagnosis not present

## 2021-03-11 DIAGNOSIS — M48061 Spinal stenosis, lumbar region without neurogenic claudication: Secondary | ICD-10-CM | POA: Diagnosis not present

## 2021-03-11 DIAGNOSIS — G629 Polyneuropathy, unspecified: Secondary | ICD-10-CM | POA: Diagnosis not present

## 2021-03-13 DIAGNOSIS — M79671 Pain in right foot: Secondary | ICD-10-CM | POA: Diagnosis not present

## 2021-03-13 DIAGNOSIS — M1612 Unilateral primary osteoarthritis, left hip: Secondary | ICD-10-CM | POA: Diagnosis not present

## 2021-03-13 DIAGNOSIS — G629 Polyneuropathy, unspecified: Secondary | ICD-10-CM | POA: Diagnosis not present

## 2021-03-13 DIAGNOSIS — M48061 Spinal stenosis, lumbar region without neurogenic claudication: Secondary | ICD-10-CM | POA: Diagnosis not present

## 2021-03-19 DIAGNOSIS — R3121 Asymptomatic microscopic hematuria: Secondary | ICD-10-CM | POA: Diagnosis not present

## 2021-03-19 DIAGNOSIS — R3915 Urgency of urination: Secondary | ICD-10-CM | POA: Diagnosis not present

## 2021-03-21 DIAGNOSIS — G629 Polyneuropathy, unspecified: Secondary | ICD-10-CM | POA: Diagnosis not present

## 2021-03-21 DIAGNOSIS — M79671 Pain in right foot: Secondary | ICD-10-CM | POA: Diagnosis not present

## 2021-03-21 DIAGNOSIS — M1612 Unilateral primary osteoarthritis, left hip: Secondary | ICD-10-CM | POA: Diagnosis not present

## 2021-03-21 DIAGNOSIS — M48061 Spinal stenosis, lumbar region without neurogenic claudication: Secondary | ICD-10-CM | POA: Diagnosis not present

## 2021-03-26 DIAGNOSIS — M1612 Unilateral primary osteoarthritis, left hip: Secondary | ICD-10-CM | POA: Diagnosis not present

## 2021-03-26 DIAGNOSIS — M79671 Pain in right foot: Secondary | ICD-10-CM | POA: Diagnosis not present

## 2021-03-26 DIAGNOSIS — M48061 Spinal stenosis, lumbar region without neurogenic claudication: Secondary | ICD-10-CM | POA: Diagnosis not present

## 2021-03-26 DIAGNOSIS — G629 Polyneuropathy, unspecified: Secondary | ICD-10-CM | POA: Diagnosis not present

## 2021-03-28 DIAGNOSIS — M1612 Unilateral primary osteoarthritis, left hip: Secondary | ICD-10-CM | POA: Diagnosis not present

## 2021-03-28 DIAGNOSIS — G629 Polyneuropathy, unspecified: Secondary | ICD-10-CM | POA: Diagnosis not present

## 2021-03-28 DIAGNOSIS — M79671 Pain in right foot: Secondary | ICD-10-CM | POA: Diagnosis not present

## 2021-03-28 DIAGNOSIS — M48061 Spinal stenosis, lumbar region without neurogenic claudication: Secondary | ICD-10-CM | POA: Diagnosis not present

## 2021-04-03 ENCOUNTER — Ambulatory Visit: Payer: Medicare HMO | Admitting: Podiatry

## 2021-04-03 ENCOUNTER — Ambulatory Visit (INDEPENDENT_AMBULATORY_CARE_PROVIDER_SITE_OTHER): Payer: Medicare HMO

## 2021-04-03 ENCOUNTER — Other Ambulatory Visit: Payer: Self-pay

## 2021-04-03 ENCOUNTER — Encounter: Payer: Self-pay | Admitting: Podiatry

## 2021-04-03 DIAGNOSIS — M7751 Other enthesopathy of right foot: Secondary | ICD-10-CM

## 2021-04-03 DIAGNOSIS — M7750 Other enthesopathy of unspecified foot: Secondary | ICD-10-CM

## 2021-04-03 DIAGNOSIS — M778 Other enthesopathies, not elsewhere classified: Secondary | ICD-10-CM

## 2021-04-03 DIAGNOSIS — M7671 Peroneal tendinitis, right leg: Secondary | ICD-10-CM | POA: Diagnosis not present

## 2021-04-03 MED ORDER — TRIAMCINOLONE ACETONIDE 10 MG/ML IJ SUSP
20.0000 mg | Freq: Once | INTRAMUSCULAR | Status: AC
  Administered 2021-04-03: 20 mg

## 2021-04-03 NOTE — Progress Notes (Signed)
Subjective:   Patient ID: [name], female   DOB: 87 y.o.   MRN: [id number]   HPI Patient presents stating that she has developed a lot of pain in both her right ankle and the outside of her right foot.  States to describe the areas and she is not sure what might of occurred but she is having trouble bearing weight and it is gotten worse over the last several months.  Does not remember specific injury and does not smoke likes to be active   Review of Systems  All other systems reviewed and are negative.       Objective:  Physical Exam Vitals and nursing note reviewed.  Constitutional:      Appearance: She is well-developed.  Pulmonary:     Effort: Pulmonary effort is normal.  Musculoskeletal:        General: Normal range of motion.  Skin:    General: Skin is warm.  Neurological:     Mental Status: She is alert.     Neurovascular status intact muscle strength adequate range of motion adequate with patient found to have quite a bit of discomfort in the sinus tarsi right with inflammation fluid of the capsule and the inflammation pain of the peroneal complex right as it inserts into the base of the fifth metatarsal with fluid buildup.  Patient had range of motion restriction secondary to splinting on the right side and had adequate muscle strength.  Patient has good digital perfusion well oriented x3     Assessment:  Inflammatory capsulitis of the right sinus tarsi capsule and also peroneal tendinitis right     Plan:  H&P reviewed both conditions.  For the ankle I did sterile prep and injected the capsule 3 mg Dexasone Kenalog 5 mg Xylocaine and for the peroneal tendon I did sterile prep and injected the insertional point 3 mg Dexasone Kenalog 5 mg Xylocaine advised on anti-inflammatories physical therapy supportive therapy and dispensed fascial brace to reduce motion.  Instructed on reduced activity reappoint as symptoms indicate  X-rays indicate that there is no signs of  fracture moderate arthritis condition moderate depression of the arch noted

## 2021-04-07 ENCOUNTER — Other Ambulatory Visit: Payer: Self-pay | Admitting: Physician Assistant

## 2021-04-07 DIAGNOSIS — Z6834 Body mass index (BMI) 34.0-34.9, adult: Secondary | ICD-10-CM | POA: Diagnosis not present

## 2021-04-07 DIAGNOSIS — R7303 Prediabetes: Secondary | ICD-10-CM | POA: Diagnosis not present

## 2021-04-14 DIAGNOSIS — M1612 Unilateral primary osteoarthritis, left hip: Secondary | ICD-10-CM | POA: Diagnosis not present

## 2021-04-14 DIAGNOSIS — M48061 Spinal stenosis, lumbar region without neurogenic claudication: Secondary | ICD-10-CM | POA: Diagnosis not present

## 2021-04-14 DIAGNOSIS — M79671 Pain in right foot: Secondary | ICD-10-CM | POA: Diagnosis not present

## 2021-04-14 DIAGNOSIS — G629 Polyneuropathy, unspecified: Secondary | ICD-10-CM | POA: Diagnosis not present

## 2021-04-15 DIAGNOSIS — E119 Type 2 diabetes mellitus without complications: Secondary | ICD-10-CM | POA: Diagnosis not present

## 2021-04-15 DIAGNOSIS — D3131 Benign neoplasm of right choroid: Secondary | ICD-10-CM | POA: Diagnosis not present

## 2021-04-15 DIAGNOSIS — H353131 Nonexudative age-related macular degeneration, bilateral, early dry stage: Secondary | ICD-10-CM | POA: Diagnosis not present

## 2021-04-15 DIAGNOSIS — H40013 Open angle with borderline findings, low risk, bilateral: Secondary | ICD-10-CM | POA: Diagnosis not present

## 2021-04-15 DIAGNOSIS — H5213 Myopia, bilateral: Secondary | ICD-10-CM | POA: Diagnosis not present

## 2021-04-15 DIAGNOSIS — H35363 Drusen (degenerative) of macula, bilateral: Secondary | ICD-10-CM | POA: Diagnosis not present

## 2021-04-15 DIAGNOSIS — H524 Presbyopia: Secondary | ICD-10-CM | POA: Diagnosis not present

## 2021-04-17 DIAGNOSIS — M79671 Pain in right foot: Secondary | ICD-10-CM | POA: Diagnosis not present

## 2021-04-17 DIAGNOSIS — M1612 Unilateral primary osteoarthritis, left hip: Secondary | ICD-10-CM | POA: Diagnosis not present

## 2021-04-17 DIAGNOSIS — G629 Polyneuropathy, unspecified: Secondary | ICD-10-CM | POA: Diagnosis not present

## 2021-04-17 DIAGNOSIS — M48061 Spinal stenosis, lumbar region without neurogenic claudication: Secondary | ICD-10-CM | POA: Diagnosis not present

## 2021-04-23 DIAGNOSIS — G629 Polyneuropathy, unspecified: Secondary | ICD-10-CM | POA: Diagnosis not present

## 2021-04-23 DIAGNOSIS — M48061 Spinal stenosis, lumbar region without neurogenic claudication: Secondary | ICD-10-CM | POA: Diagnosis not present

## 2021-04-23 DIAGNOSIS — M79671 Pain in right foot: Secondary | ICD-10-CM | POA: Diagnosis not present

## 2021-04-23 DIAGNOSIS — M1612 Unilateral primary osteoarthritis, left hip: Secondary | ICD-10-CM | POA: Diagnosis not present

## 2021-04-25 DIAGNOSIS — M48061 Spinal stenosis, lumbar region without neurogenic claudication: Secondary | ICD-10-CM | POA: Diagnosis not present

## 2021-04-25 DIAGNOSIS — G629 Polyneuropathy, unspecified: Secondary | ICD-10-CM | POA: Diagnosis not present

## 2021-04-25 DIAGNOSIS — M79671 Pain in right foot: Secondary | ICD-10-CM | POA: Diagnosis not present

## 2021-04-25 DIAGNOSIS — M1612 Unilateral primary osteoarthritis, left hip: Secondary | ICD-10-CM | POA: Diagnosis not present

## 2021-05-14 ENCOUNTER — Other Ambulatory Visit: Payer: Self-pay

## 2021-05-14 NOTE — Patient Outreach (Signed)
[facility]) Care Management  05/14/2021  RICHA SHOR Dec 18, 1930 [id number]   Telephone call to patient fr follow up on Join EMMI. Patient did not get to speak with a live person. Patient states she is doing good. Discussed [facility] Care Management and support.  Discussed disease management support through the nurse.  She states she does not want this service right now. She states she has a caregiver who helps her. She states she was just inquiring as she did not get to speak with a live person on yesterday.  CM did however discuss sending a letter and brochure for future reference.  Patient did agree to this.    Plan: RN CM will send letter and brochure and close case.    Jone Baseman, RN, MSN [facility] Care Management Coordinator Direct Line [phone] Cell [phone] Toll Free: [phone]  Fax: [phone]

## 2021-05-22 DIAGNOSIS — U071 COVID-19: Secondary | ICD-10-CM | POA: Diagnosis not present

## 2021-05-22 DIAGNOSIS — R6889 Other general symptoms and signs: Secondary | ICD-10-CM | POA: Diagnosis not present

## 2021-05-24 DIAGNOSIS — U071 COVID-19: Secondary | ICD-10-CM | POA: Diagnosis not present

## 2021-08-08 DIAGNOSIS — E782 Mixed hyperlipidemia: Secondary | ICD-10-CM | POA: Diagnosis not present

## 2021-08-08 DIAGNOSIS — D6851 Activated protein C resistance: Secondary | ICD-10-CM | POA: Diagnosis not present

## 2021-08-08 DIAGNOSIS — R7303 Prediabetes: Secondary | ICD-10-CM | POA: Diagnosis not present

## 2021-08-08 DIAGNOSIS — I10 Essential (primary) hypertension: Secondary | ICD-10-CM | POA: Diagnosis not present

## 2021-08-08 DIAGNOSIS — B009 Herpesviral infection, unspecified: Secondary | ICD-10-CM | POA: Diagnosis not present

## 2021-08-08 DIAGNOSIS — N1831 Chronic kidney disease, stage 3a: Secondary | ICD-10-CM | POA: Diagnosis not present

## 2021-09-04 ENCOUNTER — Other Ambulatory Visit: Payer: Self-pay | Admitting: Cardiology

## 2021-09-17 DIAGNOSIS — Z6833 Body mass index (BMI) 33.0-33.9, adult: Secondary | ICD-10-CM | POA: Diagnosis not present

## 2021-09-17 DIAGNOSIS — Z23 Encounter for immunization: Secondary | ICD-10-CM | POA: Diagnosis not present

## 2021-09-17 DIAGNOSIS — B370 Candidal stomatitis: Secondary | ICD-10-CM | POA: Diagnosis not present

## 2021-10-13 ENCOUNTER — Other Ambulatory Visit: Payer: Self-pay

## 2021-10-13 ENCOUNTER — Ambulatory Visit: Payer: Medicare HMO | Admitting: Cardiology

## 2021-10-13 ENCOUNTER — Encounter: Payer: Self-pay | Admitting: Cardiology

## 2021-10-13 VITALS — BP 120/70 | HR 66 | Ht 67.0 in | Wt 208.0 lb

## 2021-10-13 DIAGNOSIS — R0602 Shortness of breath: Secondary | ICD-10-CM | POA: Diagnosis not present

## 2021-10-13 DIAGNOSIS — E782 Mixed hyperlipidemia: Secondary | ICD-10-CM

## 2021-10-13 DIAGNOSIS — I10 Essential (primary) hypertension: Secondary | ICD-10-CM | POA: Diagnosis not present

## 2021-10-13 DIAGNOSIS — Z832 Family history of diseases of the blood and blood-forming organs and certain disorders involving the immune mechanism: Secondary | ICD-10-CM | POA: Diagnosis not present

## 2021-10-13 NOTE — Assessment & Plan Note (Signed)
Work-up overall reassuring.  Stress test, echocardiogram.  Continue with daily exercise.  She has a nice new walker that allows her to stand more upright.  She uses this in her driveway to walk around.  Excellent.  Be careful.  Has had disequilibrium in the past.

## 2021-10-13 NOTE — Assessment & Plan Note (Signed)
Continue with low-dose Crestor 5 mg a day.  Excellent LDL from outside labs of 56 hemoglobin 13.3 hemoglobin A1c 6.3

## 2021-10-13 NOTE — Patient Instructions (Signed)
Medication Instructions:  The current medical regimen is effective;  continue present plan and medications.  *If you need a refill on your cardiac medications before your next appointment, please call your pharmacy*  Follow-Up: At [facility], you and your health needs are our priority.  As part of our continuing mission to provide you with exceptional heart care, we have created designated Provider Care Teams.  These Care Teams include your primary Cardiologist (physician) and Advanced Practice Providers (APPs -  Physician Assistants and Nurse Practitioners) who all work together to provide you with the care you need, when you need it.  We recommend signing up for the patient portal called "MyChart".  Sign up information is provided on this After Visit Summary.  MyChart is used to connect with patients for Virtual Visits (Telemedicine).  Patients are able to view lab/test results, encounter notes, upcoming appointments, etc.  Non-urgent messages can be sent to your provider as well.   To learn more about what you can do with MyChart, go to [email or web address].    Your next appointment:   1 year(s)  The format for your next appointment:   In Person  Provider:   Mark Skains, MD   Thank you for choosing [facility]!!    

## 2021-10-13 NOTE — Progress Notes (Signed)
Cardiology Office Note:    Date:  10/13/2021   ID:  [name], DOB [birth year], MRN [id number]  PCP:  Cyndi Bender, PA-C   [facility] Providers Cardiologist:  Candee Furbish, MD     Referring MD: Cyndi Bender, PA-C     History of Present Illness:    [name] is a 87 y.o. female with a hx of anemia, CKD stage 3, GERD, hypertension, and hyperlipidemia here for follow-up.   At her last visit, she reported shortness of breath. She noticed this over the past few months but this seemed to be getting worse with minimal exertion and associated with chest tightness like, left shoulder pain. She felt that the pain is accentuated when she tries to carry anything. Her dizziness had been a factor. Walking on uneven ground was challenging for her. She also noted that she had some numbness in her feet bilaterally and right thumb.  In the distant past she had had an episode of atrial fibrillation in 2010 after knee surgery but this has never returned. She had a nuclear stress test prior to the knee surgery which was normal per patient. Sometimes her blood pressure is elevated. She also had some unclear dizziness as well as.  Today, she is accompanied by her daughter and is using a standing walker. She mentioned insurance did not cover walkers. However, her walker was an 88th birthday gift from her children.  Her shortness of breath is still persistent but is not worsening. However, these episodes happen infrequently. Slight inclines like walking into church causes her to be short of breath. She has not been to church recently because the of the shortness of breath with exertion.  For exercise, she walks around her garage with her walker. She has to be careful because the road in front of her home is under construction to be a larger highway.  She lives next to the [place] site in [place].  Her parcel of land has been rezoned to industrial  She denies any palpitations or chest pain. No  lightheadedness, headaches, syncope, orthopnea, PND, or lower extremity edema.  Past Medical History:  Diagnosis Date   Anemia    low iron   Chronic renal insufficiency, stage III (moderate) (HCC)    DDD (degenerative disc disease), lumbar    Diverticulosis    Dizziness    Dyspnea    thinks it's due to the back pain   Factor 5 Leiden mutation, heterozygous (HCC)    GERD (gastroesophageal reflux disease)    Herpes simplex infection    History of anemia    History of atrial fibrillation    HTN (hypertension)    IBS (irritable bowel syndrome)    Mixed hyperlipidemia    Osteoarthritis    Osteopenia    PONV (postoperative nausea and vomiting)    Pre-diabetes    Prediabetes    SOB (shortness of breath) on exertion    Spinal stenosis of lumbar region at multiple levels     Past Surgical History:  Procedure Laterality Date   COLONOSCOPY     DENTAL SURGERY     EYE SURGERY Bilateral    cataract surgery with lens implants   KIDNEY SURGERY Left 01/03/2009   left partial nephrectomy (pathology: leiomyoma)   LUMBAR LAMINECTOMY/DECOMPRESSION MICRODISCECTOMY N/A 09/20/2020   Procedure: LUMBAR LAMINECTOMY/DECOMPRESSION MICRODISCECTOMY LUMBAR TWO TO LUMBAR FIVE;  Surgeon: Ashok Pall, MD;  Location: [facility];  Service: Neurosurgery;  Laterality: N/A;   TOTAL KNEE ARTHROPLASTY Left  TOTAL KNEE ARTHROPLASTY Right     Current Medications: Current Meds  Medication Sig   Cholecalciferol (VITAMIN D3) 1000 units CAPS Take 1,000 Units by mouth daily.    gabapentin (NEURONTIN) 100 MG capsule Take 200 mg by mouth in the morning, at noon, and at bedtime.    losartan-hydrochlorothiazide (HYZAAR) 100-25 MG tablet Take 1 tablet by mouth daily.   Multiple Vitamins-Minerals (PRESERVISION AREDS PO) Take 1 capsule by mouth in the morning and at bedtime.    naproxen sodium (ALEVE) 220 MG tablet Take 220-440 mg by mouth 2 (two) times daily as needed (for headaches or pain).   omeprazole (PRILOSEC OTC)  20 MG tablet Take 20 mg by mouth daily before breakfast.   rosuvastatin (CRESTOR) 5 MG tablet Take 1 tablet (5 mg total) by mouth daily.   valACYclovir (VALTREX) 1000 MG tablet Take 1,000 mg by mouth 2 (two) times daily as needed (outbreaks).   vitamin E 400 UNIT capsule Take 400 Units by mouth daily.     Allergies:   Adhesive [tape], Clindamycin/lincomycin, Codeine, Dilaudid [hydromorphone hcl], Elemental sulfur, Fenofibrate, Morphine and related, and Sulfa antibiotics   Social History   Socioeconomic History   Marital status: Widowed    Spouse name: Not on file   Number of children: Not on file   Years of education: Not on file   Highest education level: Not on file  Occupational History   Not on file  Tobacco Use   Smoking status: Never   Smokeless tobacco: Never  Vaping Use   Vaping Use: Never used  Substance and Sexual Activity   Alcohol use: No   Drug use: No   Sexual activity: Not on file  Other Topics Concern   Not on file  Social History Narrative   Not on file   Social Determinants of Health   Financial Resource Strain: Not on file  Food Insecurity: Not on file  Transportation Needs: Not on file  Physical Activity: Not on file  Stress: Not on file  Social Connections: Not on file     Family History: The patient's family history includes Diabetes in her mother; Heart attack in her father and mother; Other in her father.  ROS:   Please see the history of present illness.   (+) Dyspnea on exertion  All other systems reviewed and are negative.  EKGs/Labs/Other Studies Reviewed:    The following studies were reviewed today: Myocar 05/24/20 There was no ST segment deviation noted during stress. Defect 1: There is a small defect of severe severity present in the apex location. This is a low risk study. The left ventricular ejection fraction is normal (55-65%). Nuclear stress EF: 59%. Low risk, mildly abnormal stress nuclear study with apical thinning  versus small prior infarct.  No ischemia.  Gated ejection fraction 59% with normal wall motion.  CTA 05/23/20 1. Negative for acute pulmonary embolus. No acute pulmonary airspace disease. 2. Multiple bilateral lung nodules measuring up to 6 mm in size. Non-contrast chest CT at 3-6 months is recommended. If the nodules are stable at time of repeat CT, then future CT at 18-24 months (from today's scan) is considered optional for low-risk patients, but is recommended for high-risk patients. This recommendation follows the consensus statement: Guidelines for Management of Incidental Pulmonary Nodules Detected on CT Images: From the Fleischner Society 2017; Radiology 2017; 284:228-243. Aortic Atherosclerosis (ICD10-I70.0).  Carotid Arterial Duplex 09/12/19 Right Carotid: Velocities in the right ICA are consistent with a 1-39%  Stenosis Non-hemodynamically  significant plaque <50% noted in the  CCA. The ECA appears <50% stenosed.  Left Carotid: Velocities in the left ICA are consistent with a 1-39%  stenosis. Non-hemodynamically significant plaque <50% noted in the  CCA. The ECA appears <50% stenosed.  Vertebrals:  Bilateral vertebral arteries demonstrate antegrade flow.  Subclavians: Normal flow hemodynamics were seen in bilateral subclavian arteries.   Echo 03/09/17 - LVEF 65-70%, mild LVH, normal wall motion, grade 1 DD with normal    LV filling pressure, normal LA size, mild TR, RVSP 40 mmHg,    normal IVC.  EKG:  EKG was personally reviewed 10/13/21: Sinus rhythm, rate 66 bpm; LAD 02/23/17: sinus rhythm, poor progression, T-wave inversion in 3, aVF, V6, V5, subtle in V4. Possible anterolateral ischemia. 01/20/17: sinus rhythm, leftward axis with nonspecific T-wave flattening   Recent Labs: No results found for requested labs within last 8760 hours.  Recent Lipid Panel    Component Value Date/Time   CHOL 122 10/04/2020 0731   TRIG 168 (H) 10/04/2020 0731   HDL 47 10/04/2020 0731    CHOLHDL 2.6 10/04/2020 0731   CHOLHDL 4.3 05/24/2020 0037   VLDL 32 05/24/2020 0037   LDLCALC 47 10/04/2020 0731     Risk Assessment/Calculations:          Physical Exam:    VS:  BP 120/70 (BP Location: Left Arm, Patient Position: Sitting, Cuff Size: Normal)   Pulse 66   Ht 5\' 7"  (1.702 m)   Wt 208 lb (94.3 kg)   BMI 32.58 kg/m     Wt Readings from Last 3 Encounters:  10/13/21 208 lb (94.3 kg)  09/20/20 215 lb (97.5 kg)  07/02/20 209 lb 6.4 oz (95 kg)     GEN:  Well nourished, well developed in no acute distress HEENT: Normal NECK: No JVD; No carotid bruits LYMPHATICS: No lymphadenopathy CARDIAC: RRR, no murmurs, rubs, gallops RESPIRATORY:  Clear to auscultation without rales, wheezing or rhonchi  ABDOMEN: Soft, non-tender, non-distended MUSCULOSKELETAL:  No edema; No deformity  SKIN: Warm and dry NEUROLOGIC:  Alert and oriented x 3 PSYCHIATRIC:  Normal affect   ASSESSMENT:    1. Essential hypertension   2. Shortness of breath   3. Mixed hyperlipidemia   4. Family history of factor V Leiden mutation    PLAN:    Shortness of breath Work-up overall reassuring.  Stress test, echocardiogram.  Continue with daily exercise.  She has a nice new walker that allows her to stand more upright.  She uses this in her driveway to walk around.  Excellent.  Be careful.  Has had disequilibrium in the past.  HLD (hyperlipidemia) Continue with low-dose Crestor 5 mg a day.  Excellent LDL from outside labs of 56 hemoglobin 13.3 hemoglobin A1c 6.3  Family history of factor V Leiden mutation She continues to take aspirin.  Echocardiogram overall reassuring.  No thrombotic event thus far.  Excellent.  Atypical chest pain/dyspnea with abnormal EKG  - We will proceed with echocardiogram to document her ejection fraction, and look for any valvular abnormalities. I do not appreciate a murmur on exam.  - We will also check a pharmacologic stress test. With her disequilibrium I would  not like her to be on a treadmill. Her symptoms could be ischemia. She does have T-wave changes/ST downsloping on EKG. Given her advanced age, I want to start with a noninvasive approach. Obviously if her symptoms progress or become more worrisome because it always consider cardiac catheterization.  - If cardiac  workup is unremarkable, could consider further pulmonary evaluation. I do not appreciate any active wheezing on exam.  Disequilibrium/dizziness  - Likely inner ear related/equilibrium system related especially with her complaint of what sounds like peripheral neuropathy in her feet bilaterally with numbness at night. I do not appreciate any carotid bruits. Her distal pulses, dorsalis pedis are palpable.  Essential hypertension  - Blood pressure is under excellent control. Medications reviewed.  Family history of factor V Leiden deficiency  - Many of her relatives have had blood clots in the past. She is concerned about this.  - She continues to take aspirin.  - Echocardiogram will be helpful in the sense that if her right ventricle is massively dilated, we may wish to proceed with coronary CT to exclude thromboembolic disease.  - On the other hand, she is 87 years old and has not had a thrombotic event which has good prognostic signs.  Medication Adjustments/Labs and Tests Ordered: Current medicines are reviewed at length with the patient today.  Concerns regarding medicines are outlined above.  Orders Placed This Encounter  Procedures   EKG 12-Lead    No orders of the defined types were placed in this encounter.   Patient Instructions  Medication Instructions:  The current medical regimen is effective;  continue present plan and medications.  *If you need a refill on your cardiac medications before your next appointment, please call your pharmacy*  Follow-Up: At [facility], you and your health needs are our priority.  As part of our continuing mission to provide you with  exceptional heart care, we have created designated Provider Care Teams.  These Care Teams include your primary Cardiologist (physician) and Advanced Practice Providers (APPs -  Physician Assistants and Nurse Practitioners) who all work together to provide you with the care you need, when you need it.  We recommend signing up for the patient portal called "MyChart".  Sign up information is provided on this After Visit Summary.  MyChart is used to connect with patients for Virtual Visits (Telemedicine).  Patients are able to view lab/test results, encounter notes, upcoming appointments, etc.  Non-urgent messages can be sent to your provider as well.   To learn more about what you can do with MyChart, go to [email or web address].    Your next appointment:   1 year(s)  The format for your next appointment:   In Person  Provider:   Candee Furbish, MD     Thank you for choosing [facility]!!     Wilhemina Bonito as a scribe for Candee Furbish, MD.,have documented all relevant documentation on the behalf of Candee Furbish, MD,as directed by  Candee Furbish, MD while in the presence of Candee Furbish, MD.  I, Candee Furbish, MD, have reviewed all documentation for this visit. The documentation on 10/13/21 for the exam, diagnosis, procedures, and orders are all accurate and complete.   Signed, Candee Furbish, MD  10/13/2021 11:21 AM    [place]

## 2021-10-13 NOTE — Assessment & Plan Note (Signed)
She continues to take aspirin.  Echocardiogram overall reassuring.  No thrombotic event thus far.  Excellent.

## 2021-11-04 DIAGNOSIS — H04123 Dry eye syndrome of bilateral lacrimal glands: Secondary | ICD-10-CM | POA: Diagnosis not present

## 2021-11-04 DIAGNOSIS — Z961 Presence of intraocular lens: Secondary | ICD-10-CM | POA: Diagnosis not present

## 2021-11-04 DIAGNOSIS — D3132 Benign neoplasm of left choroid: Secondary | ICD-10-CM | POA: Diagnosis not present

## 2021-11-04 DIAGNOSIS — E119 Type 2 diabetes mellitus without complications: Secondary | ICD-10-CM | POA: Diagnosis not present

## 2021-11-04 DIAGNOSIS — H40013 Open angle with borderline findings, low risk, bilateral: Secondary | ICD-10-CM | POA: Diagnosis not present

## 2021-11-07 DIAGNOSIS — G5602 Carpal tunnel syndrome, left upper limb: Secondary | ICD-10-CM | POA: Diagnosis not present

## 2021-11-07 DIAGNOSIS — Z6833 Body mass index (BMI) 33.0-33.9, adult: Secondary | ICD-10-CM | POA: Diagnosis not present

## 2021-11-07 DIAGNOSIS — Z139 Encounter for screening, unspecified: Secondary | ICD-10-CM | POA: Diagnosis not present

## 2021-11-07 DIAGNOSIS — L918 Other hypertrophic disorders of the skin: Secondary | ICD-10-CM | POA: Diagnosis not present

## 2021-11-07 DIAGNOSIS — G629 Polyneuropathy, unspecified: Secondary | ICD-10-CM | POA: Diagnosis not present

## 2021-11-07 DIAGNOSIS — H02403 Unspecified ptosis of bilateral eyelids: Secondary | ICD-10-CM | POA: Diagnosis not present

## 2021-11-07 DIAGNOSIS — M67432 Ganglion, left wrist: Secondary | ICD-10-CM | POA: Diagnosis not present

## 2021-11-10 ENCOUNTER — Other Ambulatory Visit: Payer: Self-pay | Admitting: Cardiology

## 2021-11-17 DIAGNOSIS — Z780 Asymptomatic menopausal state: Secondary | ICD-10-CM | POA: Diagnosis not present

## 2021-11-17 DIAGNOSIS — M85832 Other specified disorders of bone density and structure, left forearm: Secondary | ICD-10-CM | POA: Diagnosis not present

## 2021-11-17 DIAGNOSIS — Z1231 Encounter for screening mammogram for malignant neoplasm of breast: Secondary | ICD-10-CM | POA: Diagnosis not present

## 2021-12-25 DIAGNOSIS — D485 Neoplasm of uncertain behavior of skin: Secondary | ICD-10-CM | POA: Diagnosis not present

## 2021-12-25 DIAGNOSIS — H02413 Mechanical ptosis of bilateral eyelids: Secondary | ICD-10-CM | POA: Diagnosis not present

## 2021-12-25 DIAGNOSIS — H02831 Dermatochalasis of right upper eyelid: Secondary | ICD-10-CM | POA: Diagnosis not present

## 2021-12-25 DIAGNOSIS — H57813 Brow ptosis, bilateral: Secondary | ICD-10-CM | POA: Diagnosis not present

## 2021-12-25 DIAGNOSIS — H02834 Dermatochalasis of left upper eyelid: Secondary | ICD-10-CM | POA: Diagnosis not present

## 2021-12-25 DIAGNOSIS — H53483 Generalized contraction of visual field, bilateral: Secondary | ICD-10-CM | POA: Diagnosis not present

## 2021-12-25 DIAGNOSIS — H0279 Other degenerative disorders of eyelid and periocular area: Secondary | ICD-10-CM | POA: Diagnosis not present

## 2022-01-12 DIAGNOSIS — E785 Hyperlipidemia, unspecified: Secondary | ICD-10-CM | POA: Diagnosis not present

## 2022-01-12 DIAGNOSIS — Z9181 History of falling: Secondary | ICD-10-CM | POA: Diagnosis not present

## 2022-01-12 DIAGNOSIS — Z1331 Encounter for screening for depression: Secondary | ICD-10-CM | POA: Diagnosis not present

## 2022-01-12 DIAGNOSIS — E669 Obesity, unspecified: Secondary | ICD-10-CM | POA: Diagnosis not present

## 2022-01-12 DIAGNOSIS — Z6833 Body mass index (BMI) 33.0-33.9, adult: Secondary | ICD-10-CM | POA: Diagnosis not present

## 2022-01-12 DIAGNOSIS — Z0000 Encounter for general adult medical examination without abnormal findings: Secondary | ICD-10-CM | POA: Diagnosis not present

## 2022-02-09 DIAGNOSIS — F419 Anxiety disorder, unspecified: Secondary | ICD-10-CM | POA: Diagnosis not present

## 2022-02-09 DIAGNOSIS — E782 Mixed hyperlipidemia: Secondary | ICD-10-CM | POA: Diagnosis not present

## 2022-02-09 DIAGNOSIS — K219 Gastro-esophageal reflux disease without esophagitis: Secondary | ICD-10-CM | POA: Diagnosis not present

## 2022-02-09 DIAGNOSIS — R040 Epistaxis: Secondary | ICD-10-CM | POA: Diagnosis not present

## 2022-02-09 DIAGNOSIS — N1831 Chronic kidney disease, stage 3a: Secondary | ICD-10-CM | POA: Diagnosis not present

## 2022-02-09 DIAGNOSIS — I10 Essential (primary) hypertension: Secondary | ICD-10-CM | POA: Diagnosis not present

## 2022-02-09 DIAGNOSIS — M25512 Pain in left shoulder: Secondary | ICD-10-CM | POA: Diagnosis not present

## 2022-02-09 DIAGNOSIS — I700 Atherosclerosis of aorta: Secondary | ICD-10-CM | POA: Diagnosis not present

## 2022-02-09 DIAGNOSIS — R7303 Prediabetes: Secondary | ICD-10-CM | POA: Diagnosis not present

## 2022-02-09 DIAGNOSIS — Z79899 Other long term (current) drug therapy: Secondary | ICD-10-CM | POA: Diagnosis not present

## 2022-02-09 DIAGNOSIS — N182 Chronic kidney disease, stage 2 (mild): Secondary | ICD-10-CM | POA: Diagnosis not present

## 2022-02-14 DIAGNOSIS — M25512 Pain in left shoulder: Secondary | ICD-10-CM | POA: Diagnosis not present

## 2022-03-02 DIAGNOSIS — F419 Anxiety disorder, unspecified: Secondary | ICD-10-CM | POA: Diagnosis not present

## 2022-03-02 DIAGNOSIS — M25512 Pain in left shoulder: Secondary | ICD-10-CM | POA: Diagnosis not present

## 2022-03-02 DIAGNOSIS — I10 Essential (primary) hypertension: Secondary | ICD-10-CM | POA: Diagnosis not present

## 2022-03-04 DIAGNOSIS — D225 Melanocytic nevi of trunk: Secondary | ICD-10-CM | POA: Diagnosis not present

## 2022-03-04 DIAGNOSIS — L821 Other seborrheic keratosis: Secondary | ICD-10-CM | POA: Diagnosis not present

## 2022-03-04 DIAGNOSIS — L570 Actinic keratosis: Secondary | ICD-10-CM | POA: Diagnosis not present

## 2022-03-04 DIAGNOSIS — C44311 Basal cell carcinoma of skin of nose: Secondary | ICD-10-CM | POA: Diagnosis not present

## 2022-03-04 DIAGNOSIS — L820 Inflamed seborrheic keratosis: Secondary | ICD-10-CM | POA: Diagnosis not present

## 2022-03-04 DIAGNOSIS — X32XXXA Exposure to sunlight, initial encounter: Secondary | ICD-10-CM | POA: Diagnosis not present

## 2022-03-04 DIAGNOSIS — L814 Other melanin hyperpigmentation: Secondary | ICD-10-CM | POA: Diagnosis not present

## 2022-03-04 DIAGNOSIS — D485 Neoplasm of uncertain behavior of skin: Secondary | ICD-10-CM | POA: Diagnosis not present

## 2022-04-03 IMAGING — MR MR LUMBAR SPINE W/O CM
4 of 5 series · 26 of 48 positions shown · non-contrast
Comparison: MRI lumbar spine without contrast 05/29/2015.

CLINICAL DATA: Low back pain.  Right leg pain.

EXAM:
MRI LUMBAR SPINE WITHOUT CONTRAST
TECHNIQUE: Multiplanar, multisequence MR imaging of the lumbar spine was
performed. No intravenous contrast was administered.

[Series 3: T2 · sagittal · 4.0mm · 1.09mm/px · 6 of 16 slices shown (1 of 2)]
[im 1/16]
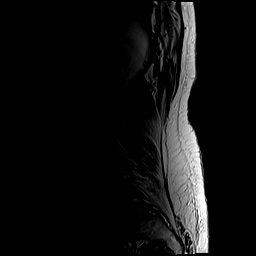
[im 4/16]
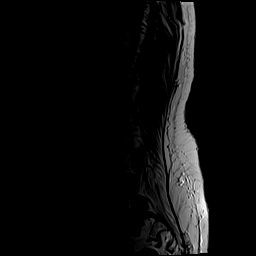
[im 7/16]
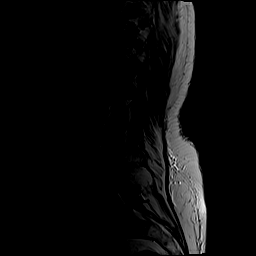
[im 10/16]
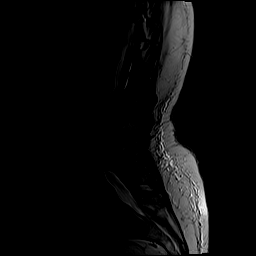
[im 13/16]
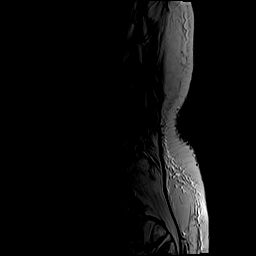
[im 16/16]
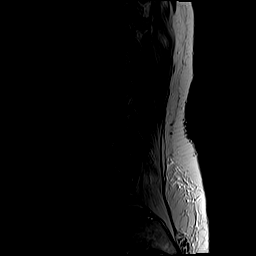

[Series 5: T1 · sagittal · 4.0mm · 1.09mm/px · 6 of 16 slices shown (1 of 2)]
[im 1/16]
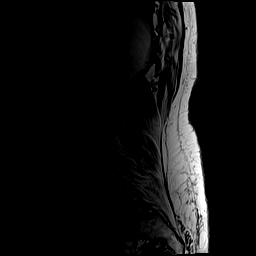
[im 4/16]
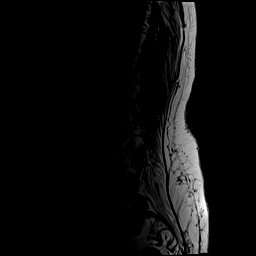
[im 7/16]
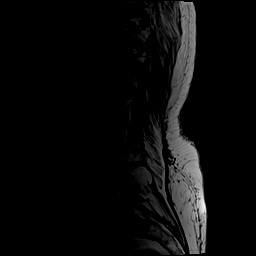
[im 10/16]
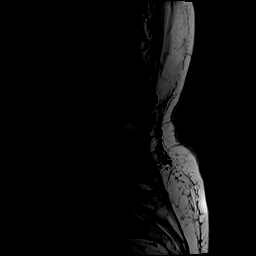
[im 13/16]
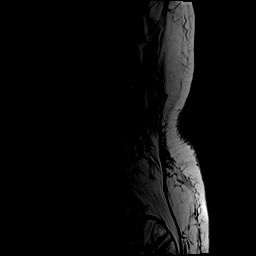
[im 16/16]
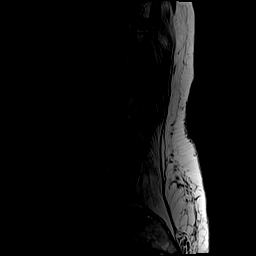

[Series 6: T2 · axial · 4.0mm · 0.39mm/px · z∈[-137,+69]mm · 9 of 40 slices shown (2 of 2)]
[im 1/40]
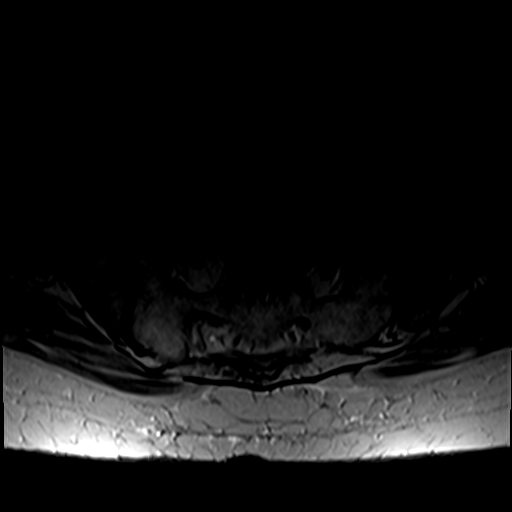
[im 6/40]
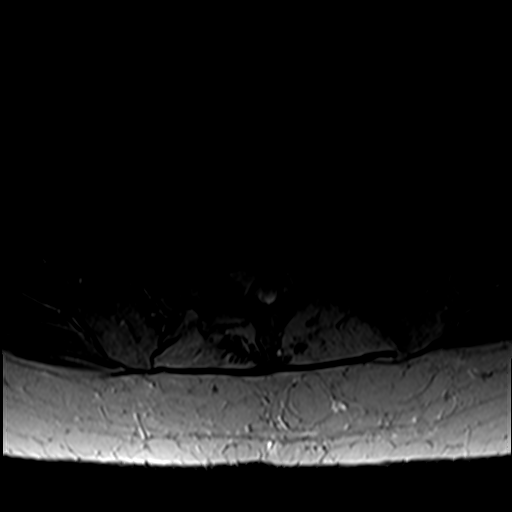
[im 12/40]
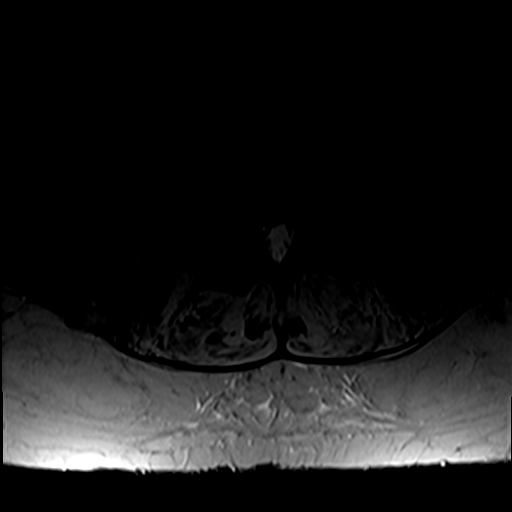
[im 17/40]
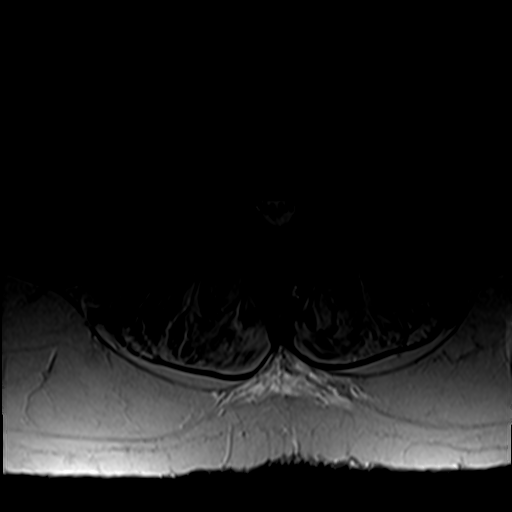
[im 20/40]
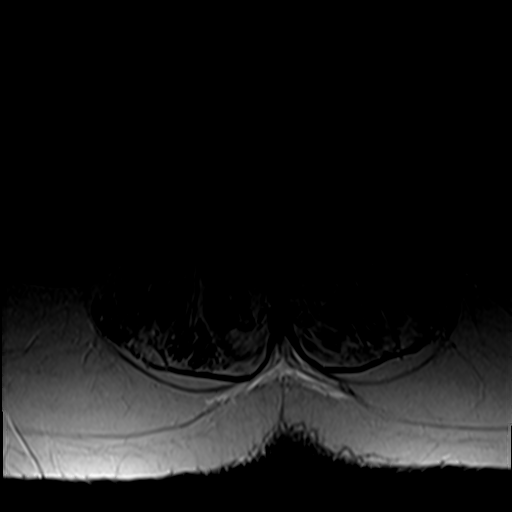
[im 23/40]
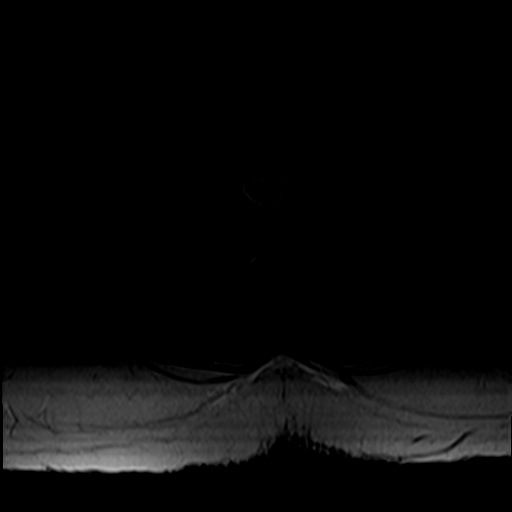
[im 28/40]
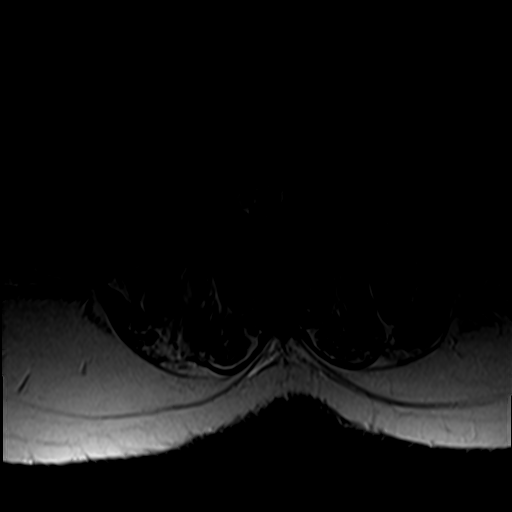
[im 34/40]
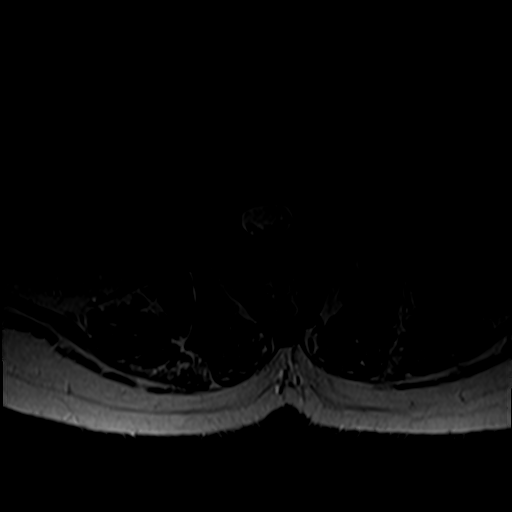
[im 40/40]
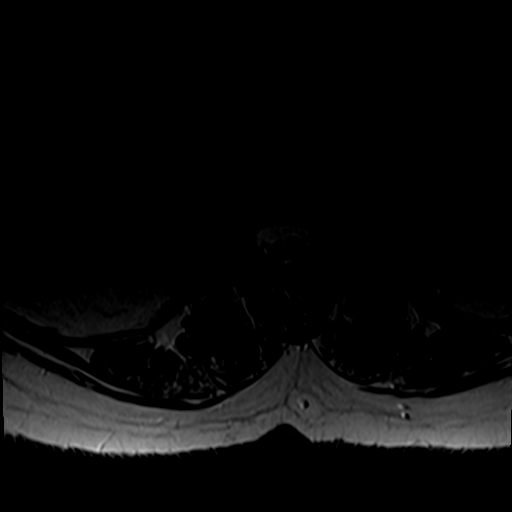

[Series 7: T1 · axial · 4.0mm · 0.39mm/px · z∈[-137,+41]mm · 5 of 40 slices shown (2 of 2)]
[im 1/40]
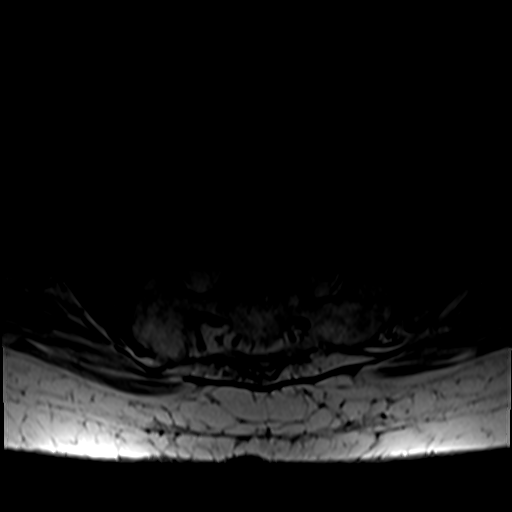
[im 6/40]
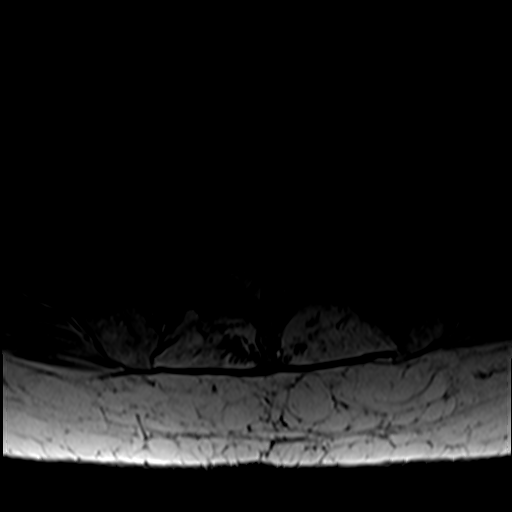
[im 12/40]
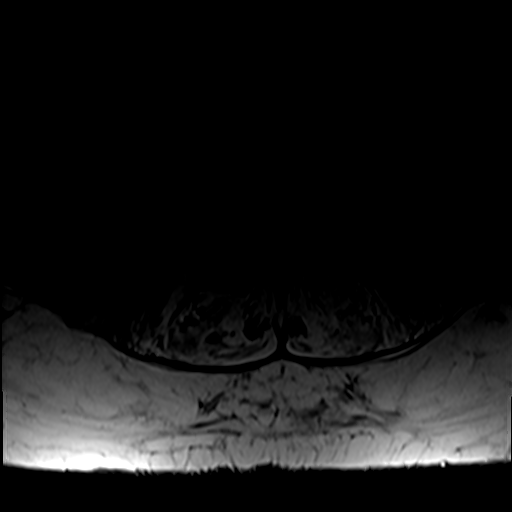
[im 20/40]
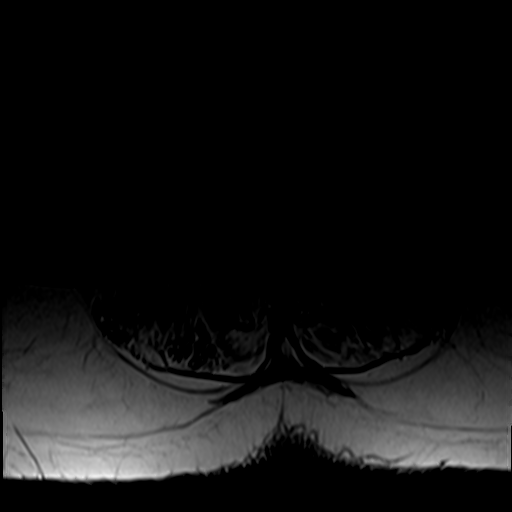
[im 34/40]
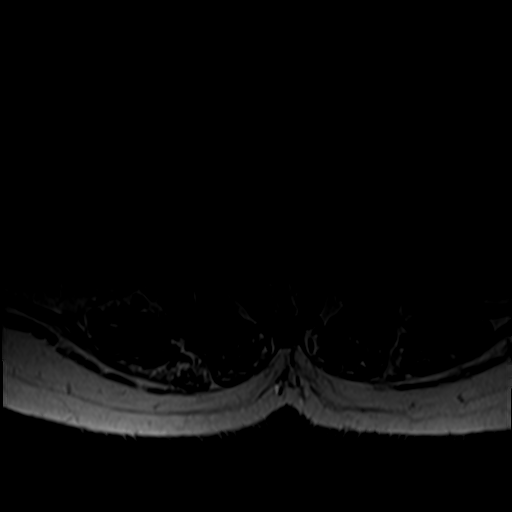

[26 of 48 positions shown; findings below may reference images not displayed]

FINDINGS: Segmentation: 5 non rib-bearing lumbar type vertebral bodies are
present. The lowest fully formed vertebral body is L5.

Alignment: Grade 1 anterolisthesis at L3-4 has progressed slightly.
Grade 1 anterolisthesis at L4-5 is stable. Rightward curvature of
the lumbar spine is centered at L2. Compensatory leftward curvature
is centered at L4.

Vertebrae:  Marrow signal and vertebral body heights are normal.

Conus medullaris and cauda equina: Conus extends to the T12-L1
level. Conus and cauda equina appear normal.

Paraspinal and other soft tissues: Limited imaging the abdomen is
unremarkable. There is no significant adenopathy. No solid organ
lesions are present.

Disc levels:

T12-L1: Mild disc bulging and facet hypertrophy is present without
significant stenosis.

L1-2: A broad-based disc protrusion is asymmetric to the left. Facet
hypertrophy is worse on the left. The central canal is patent. Mild
foraminal narrowing is worse on the left.

L2-3: A broad-based disc protrusion is present. Moderate facet
hypertrophy has progressed. Mild left subarticular narrowing is
present. Moderate foraminal narrowing has progressed bilaterally,
left greater than right.

L3-4: Advanced facet hypertrophy has progressed bilaterally. Grade 1
anterolisthesis is progressed with uncovering of a broad-based disc
protrusion. Severe central canal stenosis is now present. Severe
right and moderate left foraminal narrowing has progressed.

L4-5: A broad-based disc protrusion and moderate facet hypertrophy
has progressed. Moderate right subarticular in foraminal narrowing
has progressed. Mild left foraminal narrowing is stable.

L5-S1: Mild facet hypertrophy is present bilaterally. No significant
stenosis is present.
IMPRESSION: 1. Progressive multilevel spondylosis of the lumbar spine as
described.
2. Severe central canal stenosis at L3-4 is now present secondary to
progressive advanced bilateral facet hypertrophy and uncovering of a
broad-based disc protrusion with progressive anterolisthesis.
3. Severe right and moderate left foraminal stenosis at L3-4 has
progressed.
4. Moderate right subarticular and foraminal stenosis at L4-5 has
progressed.
5. Mild foraminal narrowing bilaterally at L1-2 is worse on the
left.
6. Moderate foraminal narrowing bilaterally at L2-3 has progressed,
left greater than right.
7. Mild facet hypertrophy at L5-S1 without significant stenosis.

## 2022-04-30 DIAGNOSIS — M25551 Pain in right hip: Secondary | ICD-10-CM | POA: Diagnosis not present

## 2022-04-30 DIAGNOSIS — M25561 Pain in right knee: Secondary | ICD-10-CM | POA: Diagnosis not present

## 2022-04-30 DIAGNOSIS — M898X6 Other specified disorders of bone, lower leg: Secondary | ICD-10-CM | POA: Diagnosis not present

## 2022-05-19 DIAGNOSIS — E119 Type 2 diabetes mellitus without complications: Secondary | ICD-10-CM | POA: Diagnosis not present

## 2022-05-19 DIAGNOSIS — H52223 Regular astigmatism, bilateral: Secondary | ICD-10-CM | POA: Diagnosis not present

## 2022-05-19 DIAGNOSIS — H40013 Open angle with borderline findings, low risk, bilateral: Secondary | ICD-10-CM | POA: Diagnosis not present

## 2022-05-19 DIAGNOSIS — H353131 Nonexudative age-related macular degeneration, bilateral, early dry stage: Secondary | ICD-10-CM | POA: Diagnosis not present

## 2022-05-19 DIAGNOSIS — H524 Presbyopia: Secondary | ICD-10-CM | POA: Diagnosis not present

## 2022-05-19 DIAGNOSIS — D3132 Benign neoplasm of left choroid: Secondary | ICD-10-CM | POA: Diagnosis not present

## 2022-05-19 DIAGNOSIS — H5213 Myopia, bilateral: Secondary | ICD-10-CM | POA: Diagnosis not present

## 2022-07-29 DIAGNOSIS — M25561 Pain in right knee: Secondary | ICD-10-CM | POA: Diagnosis not present

## 2022-07-29 DIAGNOSIS — Z96651 Presence of right artificial knee joint: Secondary | ICD-10-CM | POA: Diagnosis not present

## 2022-07-29 DIAGNOSIS — M25551 Pain in right hip: Secondary | ICD-10-CM | POA: Diagnosis not present

## 2022-07-29 DIAGNOSIS — M13851 Other specified arthritis, right hip: Secondary | ICD-10-CM | POA: Diagnosis not present

## 2022-07-29 DIAGNOSIS — M7061 Trochanteric bursitis, right hip: Secondary | ICD-10-CM | POA: Diagnosis not present

## 2022-08-04 DIAGNOSIS — M25551 Pain in right hip: Secondary | ICD-10-CM | POA: Diagnosis not present

## 2022-08-07 DIAGNOSIS — M1611 Unilateral primary osteoarthritis, right hip: Secondary | ICD-10-CM | POA: Diagnosis not present

## 2022-08-19 DIAGNOSIS — M1611 Unilateral primary osteoarthritis, right hip: Secondary | ICD-10-CM | POA: Diagnosis not present

## 2022-08-21 DIAGNOSIS — N182 Chronic kidney disease, stage 2 (mild): Secondary | ICD-10-CM | POA: Diagnosis not present

## 2022-08-21 DIAGNOSIS — R7303 Prediabetes: Secondary | ICD-10-CM | POA: Diagnosis not present

## 2022-08-21 DIAGNOSIS — E782 Mixed hyperlipidemia: Secondary | ICD-10-CM | POA: Diagnosis not present

## 2022-08-24 DIAGNOSIS — F419 Anxiety disorder, unspecified: Secondary | ICD-10-CM | POA: Diagnosis not present

## 2022-08-24 DIAGNOSIS — E782 Mixed hyperlipidemia: Secondary | ICD-10-CM | POA: Diagnosis not present

## 2022-08-24 DIAGNOSIS — R7303 Prediabetes: Secondary | ICD-10-CM | POA: Diagnosis not present

## 2022-08-24 DIAGNOSIS — I10 Essential (primary) hypertension: Secondary | ICD-10-CM | POA: Diagnosis not present

## 2022-08-24 DIAGNOSIS — Z23 Encounter for immunization: Secondary | ICD-10-CM | POA: Diagnosis not present

## 2022-08-24 DIAGNOSIS — I700 Atherosclerosis of aorta: Secondary | ICD-10-CM | POA: Diagnosis not present

## 2022-08-24 DIAGNOSIS — K219 Gastro-esophageal reflux disease without esophagitis: Secondary | ICD-10-CM | POA: Diagnosis not present

## 2022-08-24 DIAGNOSIS — D6851 Activated protein C resistance: Secondary | ICD-10-CM | POA: Diagnosis not present

## 2022-08-24 DIAGNOSIS — N182 Chronic kidney disease, stage 2 (mild): Secondary | ICD-10-CM | POA: Diagnosis not present

## 2022-09-11 DIAGNOSIS — T63461A Toxic effect of venom of wasps, accidental (unintentional), initial encounter: Secondary | ICD-10-CM | POA: Diagnosis not present

## 2022-09-28 DIAGNOSIS — M1611 Unilateral primary osteoarthritis, right hip: Secondary | ICD-10-CM | POA: Diagnosis not present

## 2022-09-28 DIAGNOSIS — M7061 Trochanteric bursitis, right hip: Secondary | ICD-10-CM | POA: Diagnosis not present

## 2022-11-11 ENCOUNTER — Other Ambulatory Visit: Payer: Self-pay | Admitting: Cardiology

## 2022-11-19 DIAGNOSIS — Z1231 Encounter for screening mammogram for malignant neoplasm of breast: Secondary | ICD-10-CM | POA: Diagnosis not present

## 2022-12-02 DIAGNOSIS — H40013 Open angle with borderline findings, low risk, bilateral: Secondary | ICD-10-CM | POA: Diagnosis not present

## 2022-12-02 DIAGNOSIS — E119 Type 2 diabetes mellitus without complications: Secondary | ICD-10-CM | POA: Diagnosis not present

## 2022-12-02 DIAGNOSIS — H353131 Nonexudative age-related macular degeneration, bilateral, early dry stage: Secondary | ICD-10-CM | POA: Diagnosis not present

## 2022-12-02 DIAGNOSIS — D3132 Benign neoplasm of left choroid: Secondary | ICD-10-CM | POA: Diagnosis not present

## 2023-01-28 DIAGNOSIS — M5412 Radiculopathy, cervical region: Secondary | ICD-10-CM | POA: Diagnosis not present

## 2023-01-28 DIAGNOSIS — M7542 Impingement syndrome of left shoulder: Secondary | ICD-10-CM | POA: Diagnosis not present

## 2023-02-02 ENCOUNTER — Other Ambulatory Visit (HOSPITAL_COMMUNITY): Payer: Self-pay

## 2023-02-03 DIAGNOSIS — M5412 Radiculopathy, cervical region: Secondary | ICD-10-CM | POA: Diagnosis not present

## 2023-02-04 ENCOUNTER — Other Ambulatory Visit (HOSPITAL_COMMUNITY): Payer: Self-pay

## 2023-02-05 ENCOUNTER — Other Ambulatory Visit (HOSPITAL_COMMUNITY): Payer: Self-pay

## 2023-02-05 ENCOUNTER — Other Ambulatory Visit: Payer: Self-pay

## 2023-02-05 MED ORDER — LOSARTAN POTASSIUM-HCTZ 100-25 MG PO TABS
1.0000 | ORAL_TABLET | Freq: Every day | ORAL | 1 refills | Status: DC
  Filled 2023-02-05: qty 90, 90d supply, fill #0

## 2023-02-22 DIAGNOSIS — E782 Mixed hyperlipidemia: Secondary | ICD-10-CM | POA: Diagnosis not present

## 2023-02-22 DIAGNOSIS — R7303 Prediabetes: Secondary | ICD-10-CM | POA: Diagnosis not present

## 2023-02-22 DIAGNOSIS — N182 Chronic kidney disease, stage 2 (mild): Secondary | ICD-10-CM | POA: Diagnosis not present

## 2023-02-22 DIAGNOSIS — Z79899 Other long term (current) drug therapy: Secondary | ICD-10-CM | POA: Diagnosis not present

## 2023-02-23 DIAGNOSIS — K219 Gastro-esophageal reflux disease without esophagitis: Secondary | ICD-10-CM | POA: Diagnosis not present

## 2023-02-23 DIAGNOSIS — L299 Pruritus, unspecified: Secondary | ICD-10-CM | POA: Diagnosis not present

## 2023-02-23 DIAGNOSIS — Z9181 History of falling: Secondary | ICD-10-CM | POA: Diagnosis not present

## 2023-02-23 DIAGNOSIS — M48061 Spinal stenosis, lumbar region without neurogenic claudication: Secondary | ICD-10-CM | POA: Diagnosis not present

## 2023-02-23 DIAGNOSIS — I10 Essential (primary) hypertension: Secondary | ICD-10-CM | POA: Diagnosis not present

## 2023-02-23 DIAGNOSIS — I700 Atherosclerosis of aorta: Secondary | ICD-10-CM | POA: Diagnosis not present

## 2023-02-23 DIAGNOSIS — D6851 Activated protein C resistance: Secondary | ICD-10-CM | POA: Diagnosis not present

## 2023-02-23 DIAGNOSIS — E782 Mixed hyperlipidemia: Secondary | ICD-10-CM | POA: Diagnosis not present

## 2023-02-23 DIAGNOSIS — J302 Other seasonal allergic rhinitis: Secondary | ICD-10-CM | POA: Diagnosis not present

## 2023-02-23 DIAGNOSIS — Z139 Encounter for screening, unspecified: Secondary | ICD-10-CM | POA: Diagnosis not present

## 2023-02-23 DIAGNOSIS — N182 Chronic kidney disease, stage 2 (mild): Secondary | ICD-10-CM | POA: Diagnosis not present

## 2023-02-23 DIAGNOSIS — E1169 Type 2 diabetes mellitus with other specified complication: Secondary | ICD-10-CM | POA: Diagnosis not present

## 2023-02-24 ENCOUNTER — Other Ambulatory Visit (HOSPITAL_COMMUNITY): Payer: Self-pay

## 2023-02-24 ENCOUNTER — Other Ambulatory Visit: Payer: Self-pay

## 2023-02-24 MED ORDER — GABAPENTIN 300 MG PO CAPS
300.0000 mg | ORAL_CAPSULE | Freq: Every evening | ORAL | 1 refills | Status: DC
  Filled 2023-02-24: qty 90, 90d supply, fill #0
  Filled 2023-05-23: qty 90, 90d supply, fill #1

## 2023-02-24 MED ORDER — FLUTICASONE PROPIONATE 50 MCG/ACT NA SUSP
NASAL | 11 refills | Status: DC
  Filled 2023-02-24: qty 16, 30d supply, fill #0
  Filled 2024-01-03: qty 16, 30d supply, fill #1

## 2023-02-26 DIAGNOSIS — M5412 Radiculopathy, cervical region: Secondary | ICD-10-CM | POA: Diagnosis not present

## 2023-03-09 ENCOUNTER — Other Ambulatory Visit: Payer: Self-pay

## 2023-03-09 ENCOUNTER — Other Ambulatory Visit (HOSPITAL_COMMUNITY): Payer: Self-pay

## 2023-03-09 ENCOUNTER — Encounter: Payer: Self-pay | Admitting: Cardiology

## 2023-03-09 ENCOUNTER — Ambulatory Visit: Payer: PPO | Attending: Cardiology | Admitting: Cardiology

## 2023-03-09 VITALS — BP 139/66 | HR 68 | Ht 67.0 in | Wt 203.0 lb

## 2023-03-09 DIAGNOSIS — I10 Essential (primary) hypertension: Secondary | ICD-10-CM | POA: Diagnosis not present

## 2023-03-09 DIAGNOSIS — E782 Mixed hyperlipidemia: Secondary | ICD-10-CM

## 2023-03-09 DIAGNOSIS — R0602 Shortness of breath: Secondary | ICD-10-CM | POA: Diagnosis not present

## 2023-03-09 MED ORDER — ROSUVASTATIN CALCIUM 5 MG PO TABS
5.0000 mg | ORAL_TABLET | Freq: Every day | ORAL | 3 refills | Status: DC
  Filled 2023-03-09: qty 90, 90d supply, fill #0
  Filled 2023-08-18: qty 90, 90d supply, fill #1
  Filled 2023-11-15: qty 90, 90d supply, fill #2
  Filled 2024-03-06: qty 90, 90d supply, fill #3

## 2023-03-09 MED ORDER — LOSARTAN POTASSIUM-HCTZ 100-25 MG PO TABS
1.0000 | ORAL_TABLET | Freq: Every day | ORAL | 3 refills | Status: DC
  Filled 2023-03-09 – 2023-05-23 (×2): qty 90, 90d supply, fill #0
  Filled 2023-08-03: qty 90, 90d supply, fill #1

## 2023-03-09 NOTE — Progress Notes (Signed)
Cardiology Office Note:    Date:  03/09/2023   ID:  [name], DOB Oct 20, 1931, MRN [id number]  PCP:  Monica Peak, PA-C   [facility] Providers Cardiologist:  Monica Schultz, MD     Referring MD: Monica Peak, PA-C     History of Present Illness:    [name] is a 89 y.o. female with a hx of anemia, CKD stage 3, GERD, hypertension, and hyperlipidemia here for follow-up.   In the distant past she had had an episode of atrial fibrillation in 2010 after knee surgery but this has never returned. She had a nuclear stress test prior to the knee surgery which was normal per patient. Sometimes her blood pressure is elevated. She also had some unclear dizziness as well as.  Using a standing walker. She mentioned insurance did not cover walkers. However, her walker was an 88th birthday gift from her children.  At her prior visit, she reported shortness of breath. Her shortness of breath has improved. However, these episodes happen infrequently. Slight inclines like walking into church causes her to be short of breath. She has not been to church recently because the of the shortness of breath with exertion.  For exercise, she walks around her garage with her walker. She has to be careful because the road in front of her home is under construction to be a larger highway.  She lives next to the [place] site in [place].  Her parcel of land has been rezoned to industrial.  No fevers chills nausea vomiting syncope  Past Medical History:  Diagnosis Date   Anemia    low iron   Chronic renal insufficiency, stage III (moderate)    DDD (degenerative disc disease), lumbar    Diverticulosis    Dizziness    Dyspnea    thinks it's due to the back pain   Factor 5 Leiden mutation, heterozygous    GERD (gastroesophageal reflux disease)    Herpes simplex infection    History of anemia    History of atrial fibrillation    HTN (hypertension)    IBS (irritable bowel syndrome)    Mixed  hyperlipidemia    Osteoarthritis    Osteopenia    PONV (postoperative nausea and vomiting)    Pre-diabetes    Prediabetes    SOB (shortness of breath) on exertion    Spinal stenosis of lumbar region at multiple levels     Past Surgical History:  Procedure Laterality Date   COLONOSCOPY     DENTAL SURGERY     EYE SURGERY Bilateral    cataract surgery with lens implants   KIDNEY SURGERY Left 01/03/2009   left partial nephrectomy (pathology: leiomyoma)   LUMBAR LAMINECTOMY/DECOMPRESSION MICRODISCECTOMY N/A 09/20/2020   Procedure: LUMBAR LAMINECTOMY/DECOMPRESSION MICRODISCECTOMY LUMBAR TWO TO LUMBAR FIVE;  Surgeon: Monica Memos, MD;  Location: [facility] OR;  Service: Neurosurgery;  Laterality: N/A;   TOTAL KNEE ARTHROPLASTY Left    TOTAL KNEE ARTHROPLASTY Right     Current Medications: Current Meds  Medication Sig   Cholecalciferol (VITAMIN D3) 1000 units CAPS Take 1,000 Units by mouth daily.    fluticasone (FLONASE) 50 MCG/ACT nasal spray Use 2 sprays in each nostril once daily for sinus/nasal congestion.   gabapentin (NEURONTIN) 300 MG capsule Take 1 capsule (300 mg total) by mouth Nightly for nerve pain.   Multiple Vitamins-Minerals (PRESERVISION AREDS PO) Take 1 capsule by mouth in the morning and at bedtime.    omeprazole (PRILOSEC OTC) 20 MG tablet  Take 20 mg by mouth daily before breakfast.   valACYclovir (VALTREX) 1000 MG tablet Take 1,000 mg by mouth 2 (two) times daily as needed (outbreaks).   vitamin E 400 UNIT capsule Take 400 Units by mouth daily.   [DISCONTINUED] losartan-hydrochlorothiazide (HYZAAR) 100-25 MG tablet Take 1 tablet by mouth daily.   [DISCONTINUED] rosuvastatin (CRESTOR) 5 MG tablet Take 1 tablet (5 mg total) by mouth daily.     Allergies:   Adhesive [tape], Clindamycin/lincomycin, Codeine, Dilaudid [hydromorphone hcl], Elemental sulfur, Fenofibrate, Morphine and related, and Sulfa antibiotics   Social History   Socioeconomic History   Marital status:  Widowed    Spouse name: Not on file   Number of children: Not on file   Years of education: Not on file   Highest education level: Not on file  Occupational History   Not on file  Tobacco Use   Smoking status: Never   Smokeless tobacco: Never  Vaping Use   Vaping Use: Never used  Substance and Sexual Activity   Alcohol use: No   Drug use: No   Sexual activity: Not on file  Other Topics Concern   Not on file  Social History Narrative   Not on file   Social Determinants of Health   Financial Resource Strain: Not on file  Food Insecurity: Not on file  Transportation Needs: Not on file  Physical Activity: Not on file  Stress: Not on file  Social Connections: Not on file     Family History: The patient's family history includes Diabetes in her mother; Heart attack in her father and mother; Other in her father.  ROS:   Please see the history of present illness.   Mild ankle edema  All other systems reviewed and are negative.  EKGs/Labs/Other Studies Reviewed:    The following studies were reviewed today:  Cardiac Studies & Procedures     STRESS TESTS  NM MYOCAR MULTI W/SPECT W 05/24/2020  Narrative  There was no ST segment deviation noted during stress.  Defect 1: There is a small defect of severe severity present in the apex location.  This is a low risk study.  The left ventricular ejection fraction is normal (55-65%).  Nuclear stress EF: 59%.  Low risk, mildly abnormal stress nuclear study with apical thinning versus small prior infarct.  No ischemia.  Gated ejection fraction 59% with normal wall motion.   ECHOCARDIOGRAM  ECHOCARDIOGRAM COMPLETE 03/09/2017  Narrative *[facility]* [address] [place], [place] [address] [phone]  ------------------------------------------------------------------- Transthoracic Echocardiography  Patient:    Monica, Hawkins MR #:       [id number] Study Date: 03/09/2017 Gender:     F Age:         52 Height:     170.2 cm Weight:     93 kg BSA:        2.13 m^2 Pt. Status: Room:  SONOGRAPHER  [name], M.D. REFERRING    [name], M.D. ATTENDING    Monica Shutter MD PERFORMING   [facility]  cc:  ------------------------------------------------------------------- LV EF: 65% -   70%  ------------------------------------------------------------------- Indications:      (R07.9).  ------------------------------------------------------------------- History:   PMH:  Acquired from the patient and from the patient&'s chart.  Chest pain.  Dyspnea.  Risk factors:  Hypertension. Dyslipidemia.  ------------------------------------------------------------------- Study Conclusions  - Left ventricle: The cavity size was normal. Wall thickness was increased in a pattern of mild LVH.  Systolic function was vigorous. The estimated ejection fraction was in the range of 65% to 70%. Wall motion was normal; there were no regional wall motion abnormalities. Doppler parameters are consistent with abnormal left ventricular relaxation (grade 1 diastolic dysfunction). The E/e&' ratio is <8, suggesting normal LV filling pressure. - Left atrium: The atrium was normal in size. - Tricuspid valve: There was mild regurgitation. - Pulmonary arteries: PA Hawkins pressure: 40 mm Hg (S). - Inferior vena cava: The vessel was normal in size. The respirophasic diameter changes were in the normal range (>= 50%), consistent with normal central venous pressure.  Impressions:  - LVEF 65-70%, mild LVH, normal wall motion, grade 1 DD with normal LV filling pressure, normal LA size, mild TR, RVSP 40 mmHg, normal IVC.  ------------------------------------------------------------------- Study data:  No prior study was available for comparison.  Study status:  Routine.  Procedure:  The patient reported no pain pre or post test. Transthoracic echocardiography for left  ventricular function evaluation. Image quality was adequate.  Study completion: There were no complications.          Transthoracic echocardiography.  M-mode, complete 2D, spectral Doppler, and color Doppler.  Birthdate:  Patient birthdate: May 18, 1931.  Age:  Patient is 89 yr old.  Sex:  Gender: female.    BMI: 32.1 kg/m^2.  Blood pressure:     136/80  Patient status:  Outpatient.  Study date: Study date: 03/09/2017. Study time: 03:16 PM.  Location:  [facility]  -------------------------------------------------------------------  ------------------------------------------------------------------- Left ventricle:  The cavity size was normal. Wall thickness was increased in a pattern of mild LVH. Systolic function was vigorous. The estimated ejection fraction was in the range of 65% to 70%. Wall motion was normal; there were no regional wall motion abnormalities. Doppler parameters are consistent with abnormal left ventricular relaxation (grade 1 diastolic dysfunction). The E/e&' ratio is <8, suggesting normal LV filling pressure.  ------------------------------------------------------------------- Aortic valve:   Structurally normal valve. Trileaflet. Cusp separation was normal.  Doppler:  Transvalvular velocity was within the normal range. There was no stenosis. There was no regurgitation.  ------------------------------------------------------------------- Aorta:  Aortic root: The aortic root was normal in size. Ascending aorta: The ascending aorta was normal in size.  ------------------------------------------------------------------- Mitral valve:   Structurally normal valve.   Leaflet separation was normal.  Doppler:  Transvalvular velocity was within the normal range. There was no evidence for stenosis. There was no regurgitation.    Hawkins gradient (D): 2 mm Hg.  ------------------------------------------------------------------- Left atrium:  The atrium was normal in  size.  ------------------------------------------------------------------- Atrial septum:  No defect or patent foramen ovale was identified.  ------------------------------------------------------------------- Right ventricle:  The cavity size was normal. Wall thickness was normal. Systolic function was normal.  ------------------------------------------------------------------- Pulmonic valve:    The valve appears to be grossly normal. Doppler:  There was no significant regurgitation.  ------------------------------------------------------------------- Tricuspid valve:   Doppler:  There was mild regurgitation.  ------------------------------------------------------------------- Pulmonary artery:   The main pulmonary artery was normal-sized.  ------------------------------------------------------------------- Right atrium:  The atrium was normal in size.  ------------------------------------------------------------------- Pericardium:  There was no pericardial effusion.  ------------------------------------------------------------------- Systemic veins: Inferior vena cava: The vessel was normal in size. The respirophasic diameter changes were in the normal range (>= 50%), consistent with normal central venous pressure.  ------------------------------------------------------------------- Measurements  Left ventricle                           Value  Reference LV ID, ED, PLAX chordal                  45.6  mm     43 - 52 LV ID, ES, PLAX chordal                  26.2  mm     23 - 38 LV fx shortening, PLAX chordal           43    %      >=29 LV PW thickness, ED                      12    mm     --------- IVS/LV PW ratio, ED                      1            <=1.3 Stroke volume, 2D                        77    ml     --------- Stroke volume/bsa, 2D                    36    ml/m^2 --------- LV ejection fraction, 1-p A4C            66    %      --------- LV end-diastolic  volume, 2-p             50    ml     --------- LV end-systolic volume, 2-p              17    ml     --------- LV ejection fraction, 2-p                66    %      --------- Stroke volume, 2-p                       33    ml     --------- LV end-diastolic volume/bsa, 2-p         23    ml/m^2 --------- LV end-systolic volume/bsa, 2-p          8     ml/m^2 --------- Stroke volume/bsa, 2-p                   15.5  ml/m^2 --------- LV e&', lateral                           7.51  cm/s   --------- LV E/e&', lateral                         9.6          --------- LV e&', medial                            10.4  cm/s   --------- LV E/e&', medial                          6.93         --------- LV e&', average  8.96  cm/s   --------- LV E/e&', average                         8.05         ---------  Ventricular septum                       Value        Reference IVS thickness, ED                        12    mm     ---------  LVOT                                     Value        Reference LVOT ID, S                               20    mm     --------- LVOT area                                3.14  cm^2   --------- LVOT ID                                  20    mm     --------- LVOT Hawkins velocity, S                    112   cm/s   --------- LVOT mean velocity, S                    71.4  cm/s   --------- LVOT VTI, S                              24.5  cm     --------- LVOT Hawkins gradient, S                    5     mm Hg  --------- Stroke volume (SV), LVOT DP              77    ml     --------- Stroke index (SV/bsa), LVOT DP           36.2  ml/m^2 ---------  Aorta                                    Value        Reference Aortic root ID, ED                       34    mm     --------- Ascending aorta ID, A-P, S               29    mm     ---------  Left atrium  Value        Reference LA ID, A-P, ES                           43    mm     --------- LA  ID/bsa, A-P                           2.02  cm/m^2 <=2.2 LA volume, S                             50    ml     --------- LA volume/bsa, S                         23.5  ml/m^2 --------- LA volume, ES, 1-p A4C                   40    ml     --------- LA volume/bsa, ES, 1-p A4C               18.8  ml/m^2 --------- LA volume, ES, 1-p A2C                   60    ml     --------- LA volume/bsa, ES, 1-p A2C               28.2  ml/m^2 ---------  Mitral valve                             Value        Reference Mitral E-wave Hawkins velocity              72.1  cm/s   --------- Mitral A-wave Hawkins velocity              107   cm/s   --------- Mitral deceleration time         (H)     324   ms     150 - 230 Mitral Hawkins gradient, D                  2     mm Hg  --------- Mitral E/A ratio, Hawkins                   0.7          ---------  Pulmonary arteries                       Value        Reference PA pressure, S, DP               (H)     40    mm Hg  <=30  Tricuspid valve                          Value        Reference Tricuspid regurg Hawkins velocity           304   cm/s   --------- Tricuspid Hawkins RV-RA gradient            37    mm Hg  ---------  Systemic veins  Value        Reference Estimated CVP                            3     mm Hg  ---------  Right ventricle                          Value        Reference RV pressure, S, DP               (H)     40    mm Hg  <=30 RV s&', lateral, S                        17.3  cm/s   ---------  Legend: (L)  and  (H)  Sarvesh Meddaugh values outside specified reference range.  ------------------------------------------------------------------- Prepared and Electronically Authenticated by  Monica Shutter MD 2018-04-10T18:45:34             NUC stress 05/24/20 Low risk, mildly abnormal stress nuclear study with apical thinning versus small prior infarct.  No ischemia.  Gated ejection fraction 59% with normal wall motion.  CTA 05/23/20 1. Negative  for acute pulmonary embolus. No acute pulmonary airspace disease. 2. Multiple bilateral lung nodules measuring up to 6 mm in size. Non-contrast chest CT at 3-6 months is recommended. If the nodules are stable at time of repeat CT, then future CT at 18-24 months (from today's scan) is considered optional for low-risk patients, but is recommended for high-risk patients. This recommendation follows the consensus statement: Guidelines for Management of Incidental Pulmonary Nodules Detected on CT Images: From the Fleischner Society 2017; Radiology 2017; 284:228-243. Aortic Atherosclerosis (ICD10-I70.0).   Echo 03/09/17 - LVEF 65-70%, mild LVH, normal wall motion, grade 1 DD with normal    LV filling pressure, normal LA size, mild TR, RVSP 40 mmHg,    normal IVC.  EKG:  EKG was personally reviewed /9/24-normal sinus rhythm 68 borderline LVH. 10/13/21: Sinus rhythm, rate 66 bpm; LAD 02/23/17: sinus rhythm, poor progression, T-wave inversion in 3, aVF, V6, V5, subtle in V4. Possible anterolateral ischemia. 01/20/17: sinus rhythm, leftward axis with nonspecific T-wave flattening   Recent Labs: No results found for requested labs within last 365 days.  Recent Lipid Panel    Component Value Date/Time   CHOL 122 10/04/2020 0731   TRIG 168 (H) 10/04/2020 0731   HDL 47 10/04/2020 0731   CHOLHDL 2.6 10/04/2020 0731   CHOLHDL 4.3 05/24/2020 0037   VLDL 32 05/24/2020 0037   LDLCALC 47 10/04/2020 0731     Risk Assessment/Calculations:          Physical Exam:    VS:  BP 139/66 (BP Location: Left Arm, Patient Position: Sitting, Cuff Size: Normal)   Pulse 68   Ht 5\' 7"  (1.702 m)   Wt 203 lb (92.1 kg)   BMI 31.79 kg/m     Wt Readings from Last 3 Encounters:  03/09/23 203 lb (92.1 kg)  10/13/21 208 lb (94.3 kg)  09/20/20 215 lb (97.5 kg)     GEN: Well nourished, well developed, in no acute distress HEENT: normal Neck: no JVD, carotid bruits, or masses Cardiac: RRR; no murmurs,  rubs, or gallops,no edema  Respiratory:  clear to auscultation bilaterally, normal work of breathing GI: soft, nontender, nondistended, + BS MS: no deformity or atrophy Skin: warm and  dry, no rash Neuro:  Alert and Oriented x 3, Strength and sensation are intact Psych: euthymic mood, full affect   ASSESSMENT:    1. Shortness of breath   2. Mixed hyperlipidemia   3. Essential hypertension     PLAN:     Shortness of breath Thankfully this has improved.  Prior work-up overall reassuring.  Stress test, echocardiogram.  Continue with daily exercise.  She has a nice walker that allows her to stand more upright.  She uses this in her driveway to walk around.  Excellent.  Be careful.  Has had disequilibrium in the past.  HLD (hyperlipidemia) Continue with low-dose Crestor 5 mg a day.  Excellent LDL less than 70 from outside labs.  Hemoglobin 13.3 hemoglobin A1c 6.3  Disequilibrium/dizziness  - Likely inner ear related/equilibrium system related especially with her complaint of what sounds like peripheral neuropathy in her feet bilaterally with numbness at night. I do not appreciate any carotid bruits. Her distal pulses, dorsalis pedis are palpable.  Continue with exercise.  Essential hypertension  - Blood pressure is under excellent control. Medications reviewed.  No changes made.  Refills.  Family history of factor V Leiden deficiency  - Many of her relatives have had blood clots in the past. She is concerned about this.  - Off of aspirin.nosebleeds.  Takes Tylenol for discomfort.  No longer on aspirin because of nosebleeds.  - On the other hand,  has not had a thrombotic event which has good prognostic signs.  Medication Adjustments/Labs and Tests Ordered: Current medicines are reviewed at length with the patient today.  Concerns regarding medicines are outlined above.  Orders Placed This Encounter  Procedures   EKG 12-Lead    Meds ordered this encounter  Medications    rosuvastatin (CRESTOR) 5 MG tablet    Sig: Take 1 tablet (5 mg total) by mouth daily.    Dispense:  90 tablet    Refill:  3   losartan-hydrochlorothiazide (HYZAAR) 100-25 MG tablet    Sig: Take 1 tablet by mouth daily.    Dispense:  90 tablet    Refill:  3     Patient Instructions  Medication Instructions:  The current medical regimen is effective;  continue present plan and medications.  *If you need a refill on your cardiac medications before your next appointment, please call your pharmacy*   Follow-Up: At [facility], you and your health needs are our priority.  As part of our continuing mission to provide you with exceptional heart care, we have created designated Provider Care Teams.  These Care Teams include your primary Cardiologist (physician) and Advanced Practice Providers (APPs -  Physician Assistants and Nurse Practitioners) who all work together to provide you with the care you need, when you need it.  We recommend signing up for the patient portal called "MyChart".  Sign up information is provided on this After Visit Summary.  MyChart is used to connect with patients for Virtual Visits (Telemedicine).  Patients are able to view lab/test results, encounter notes, upcoming appointments, etc.  Non-urgent messages can be sent to your provider as well.   To learn more about what you can do with MyChart, go to [email or web address].    Your next appointment:   1 year  Provider:   Donato Schultz, MD         Signed, Monica Schultz, MD  03/09/2023 10:06 AM     [facility]

## 2023-03-09 NOTE — Patient Instructions (Addendum)
Medication Instructions:  The current medical regimen is effective;  continue present plan and medications.  *If you need a refill on your cardiac medications before your next appointment, please call your pharmacy*   Follow-Up: At [facility], you and your health needs are our priority.  As part of our continuing mission to provide you with exceptional heart care, we have created designated Provider Care Teams.  These Care Teams include your primary Cardiologist (physician) and Advanced Practice Providers (APPs -  Physician Assistants and Nurse Practitioners) who all work together to provide you with the care you need, when you need it.  We recommend signing up for the patient portal called "MyChart".  Sign up information is provided on this After Visit Summary.  MyChart is used to connect with patients for Virtual Visits (Telemedicine).  Patients are able to view lab/test results, encounter notes, upcoming appointments, etc.  Non-urgent messages can be sent to your provider as well.   To learn more about what you can do with MyChart, go to [email or web address].    Your next appointment:   1 year  Provider:   Donato Schultz, MD

## 2023-04-02 DIAGNOSIS — J209 Acute bronchitis, unspecified: Secondary | ICD-10-CM | POA: Diagnosis not present

## 2023-04-02 DIAGNOSIS — R051 Acute cough: Secondary | ICD-10-CM | POA: Diagnosis not present

## 2023-04-02 DIAGNOSIS — Z20822 Contact with and (suspected) exposure to covid-19: Secondary | ICD-10-CM | POA: Diagnosis not present

## 2023-04-02 DIAGNOSIS — J019 Acute sinusitis, unspecified: Secondary | ICD-10-CM | POA: Diagnosis not present

## 2023-04-08 ENCOUNTER — Other Ambulatory Visit (HOSPITAL_COMMUNITY): Payer: Self-pay

## 2023-04-08 DIAGNOSIS — J019 Acute sinusitis, unspecified: Secondary | ICD-10-CM | POA: Diagnosis not present

## 2023-04-08 MED ORDER — ALBUTEROL SULFATE HFA 108 (90 BASE) MCG/ACT IN AERS
2.0000 | INHALATION_SPRAY | Freq: Four times a day (QID) | RESPIRATORY_TRACT | 2 refills | Status: AC | PRN
  Filled 2023-04-08: qty 6.7, 17d supply, fill #0

## 2023-04-08 MED ORDER — BENZONATATE 200 MG PO CAPS
200.0000 mg | ORAL_CAPSULE | Freq: Three times a day (TID) | ORAL | 0 refills | Status: DC | PRN
  Filled 2023-04-08: qty 30, 10d supply, fill #0

## 2023-04-09 ENCOUNTER — Other Ambulatory Visit: Payer: Self-pay

## 2023-04-09 ENCOUNTER — Encounter: Payer: Self-pay | Admitting: Pharmacist

## 2023-04-13 ENCOUNTER — Other Ambulatory Visit: Payer: Self-pay

## 2023-04-14 ENCOUNTER — Other Ambulatory Visit: Payer: Self-pay

## 2023-04-30 DIAGNOSIS — K219 Gastro-esophageal reflux disease without esophagitis: Secondary | ICD-10-CM | POA: Diagnosis not present

## 2023-04-30 DIAGNOSIS — M8580 Other specified disorders of bone density and structure, unspecified site: Secondary | ICD-10-CM | POA: Diagnosis not present

## 2023-05-24 ENCOUNTER — Other Ambulatory Visit (HOSPITAL_COMMUNITY): Payer: Self-pay

## 2023-05-24 ENCOUNTER — Other Ambulatory Visit: Payer: Self-pay

## 2023-07-02 DIAGNOSIS — D0461 Carcinoma in situ of skin of right upper limb, including shoulder: Secondary | ICD-10-CM | POA: Diagnosis not present

## 2023-08-03 ENCOUNTER — Other Ambulatory Visit (HOSPITAL_COMMUNITY): Payer: Self-pay

## 2023-08-13 DIAGNOSIS — X32XXXD Exposure to sunlight, subsequent encounter: Secondary | ICD-10-CM | POA: Diagnosis not present

## 2023-08-13 DIAGNOSIS — L570 Actinic keratosis: Secondary | ICD-10-CM | POA: Diagnosis not present

## 2023-08-13 DIAGNOSIS — Z08 Encounter for follow-up examination after completed treatment for malignant neoplasm: Secondary | ICD-10-CM | POA: Diagnosis not present

## 2023-08-13 DIAGNOSIS — Z85828 Personal history of other malignant neoplasm of skin: Secondary | ICD-10-CM | POA: Diagnosis not present

## 2023-08-18 ENCOUNTER — Other Ambulatory Visit (HOSPITAL_COMMUNITY): Payer: Self-pay

## 2023-08-25 DIAGNOSIS — N182 Chronic kidney disease, stage 2 (mild): Secondary | ICD-10-CM | POA: Diagnosis not present

## 2023-08-25 DIAGNOSIS — E1169 Type 2 diabetes mellitus with other specified complication: Secondary | ICD-10-CM | POA: Diagnosis not present

## 2023-08-25 DIAGNOSIS — E782 Mixed hyperlipidemia: Secondary | ICD-10-CM | POA: Diagnosis not present

## 2023-08-26 LAB — LAB REPORT - SCANNED
A1c: 6.4
EGFR: 74

## 2023-08-31 ENCOUNTER — Other Ambulatory Visit (HOSPITAL_COMMUNITY): Payer: Self-pay

## 2023-08-31 DIAGNOSIS — Z23 Encounter for immunization: Secondary | ICD-10-CM | POA: Diagnosis not present

## 2023-08-31 DIAGNOSIS — E1121 Type 2 diabetes mellitus with diabetic nephropathy: Secondary | ICD-10-CM | POA: Diagnosis not present

## 2023-08-31 DIAGNOSIS — M48061 Spinal stenosis, lumbar region without neurogenic claudication: Secondary | ICD-10-CM | POA: Diagnosis not present

## 2023-08-31 DIAGNOSIS — I700 Atherosclerosis of aorta: Secondary | ICD-10-CM | POA: Diagnosis not present

## 2023-08-31 DIAGNOSIS — E782 Mixed hyperlipidemia: Secondary | ICD-10-CM | POA: Diagnosis not present

## 2023-08-31 DIAGNOSIS — M19042 Primary osteoarthritis, left hand: Secondary | ICD-10-CM | POA: Diagnosis not present

## 2023-08-31 DIAGNOSIS — K219 Gastro-esophageal reflux disease without esophagitis: Secondary | ICD-10-CM | POA: Diagnosis not present

## 2023-08-31 DIAGNOSIS — D6851 Activated protein C resistance: Secondary | ICD-10-CM | POA: Diagnosis not present

## 2023-08-31 DIAGNOSIS — M19041 Primary osteoarthritis, right hand: Secondary | ICD-10-CM | POA: Diagnosis not present

## 2023-08-31 DIAGNOSIS — N182 Chronic kidney disease, stage 2 (mild): Secondary | ICD-10-CM | POA: Diagnosis not present

## 2023-08-31 DIAGNOSIS — I10 Essential (primary) hypertension: Secondary | ICD-10-CM | POA: Diagnosis not present

## 2023-08-31 MED ORDER — OMEPRAZOLE 20 MG PO CPDR
20.0000 mg | DELAYED_RELEASE_CAPSULE | Freq: Every day | ORAL | 1 refills | Status: DC
  Filled 2023-08-31 – 2023-09-07 (×2): qty 90, 90d supply, fill #0
  Filled 2023-11-15 – 2023-12-02 (×2): qty 90, 90d supply, fill #1

## 2023-08-31 MED ORDER — GABAPENTIN 300 MG PO CAPS
600.0000 mg | ORAL_CAPSULE | Freq: Every evening | ORAL | 1 refills | Status: DC
  Filled 2023-08-31 – 2023-09-07 (×2): qty 180, 90d supply, fill #0
  Filled 2023-11-17: qty 180, 90d supply, fill #1

## 2023-08-31 MED ORDER — DICLOFENAC SODIUM 1 % EX GEL
2.0000 g | Freq: Four times a day (QID) | CUTANEOUS | 2 refills | Status: DC
  Filled 2023-08-31: qty 100, 13d supply, fill #0
  Filled 2023-09-07: qty 100, 12d supply, fill #0
  Filled 2024-01-03: qty 500, 31d supply, fill #1
  Filled 2024-05-27 – 2024-07-04 (×2): qty 500, 31d supply, fill #2

## 2023-09-01 ENCOUNTER — Other Ambulatory Visit (HOSPITAL_COMMUNITY): Payer: Self-pay

## 2023-09-01 ENCOUNTER — Other Ambulatory Visit: Payer: Self-pay

## 2023-09-03 ENCOUNTER — Other Ambulatory Visit: Payer: Self-pay

## 2023-09-07 ENCOUNTER — Other Ambulatory Visit (HOSPITAL_COMMUNITY): Payer: Self-pay

## 2023-09-07 ENCOUNTER — Other Ambulatory Visit: Payer: Self-pay

## 2023-09-08 ENCOUNTER — Other Ambulatory Visit: Payer: Self-pay

## 2023-09-15 DIAGNOSIS — H524 Presbyopia: Secondary | ICD-10-CM | POA: Diagnosis not present

## 2023-09-15 DIAGNOSIS — H353131 Nonexudative age-related macular degeneration, bilateral, early dry stage: Secondary | ICD-10-CM | POA: Diagnosis not present

## 2023-09-15 DIAGNOSIS — E119 Type 2 diabetes mellitus without complications: Secondary | ICD-10-CM | POA: Diagnosis not present

## 2023-09-15 DIAGNOSIS — H40013 Open angle with borderline findings, low risk, bilateral: Secondary | ICD-10-CM | POA: Diagnosis not present

## 2023-09-15 DIAGNOSIS — D3132 Benign neoplasm of left choroid: Secondary | ICD-10-CM | POA: Diagnosis not present

## 2023-09-26 ENCOUNTER — Observation Stay (HOSPITAL_COMMUNITY)
Admission: EM | Admit: 2023-09-26 | Discharge: 2023-09-27 | Disposition: A | Discharge status: Home / Self Care | Payer: PPO | Attending: Internal Medicine | Admitting: Internal Medicine

## 2023-09-26 ENCOUNTER — Emergency Department (HOSPITAL_COMMUNITY): Payer: PPO

## 2023-09-26 ENCOUNTER — Encounter (HOSPITAL_COMMUNITY): Payer: Self-pay

## 2023-09-26 DIAGNOSIS — J90 Pleural effusion, not elsewhere classified: Secondary | ICD-10-CM | POA: Diagnosis not present

## 2023-09-26 DIAGNOSIS — Z96653 Presence of artificial knee joint, bilateral: Secondary | ICD-10-CM | POA: Insufficient documentation

## 2023-09-26 DIAGNOSIS — E876 Hypokalemia: Secondary | ICD-10-CM | POA: Insufficient documentation

## 2023-09-26 DIAGNOSIS — Z1152 Encounter for screening for COVID-19: Secondary | ICD-10-CM | POA: Insufficient documentation

## 2023-09-26 DIAGNOSIS — I10 Essential (primary) hypertension: Secondary | ICD-10-CM | POA: Diagnosis not present

## 2023-09-26 DIAGNOSIS — N189 Chronic kidney disease, unspecified: Secondary | ICD-10-CM | POA: Insufficient documentation

## 2023-09-26 DIAGNOSIS — I4891 Unspecified atrial fibrillation: Secondary | ICD-10-CM | POA: Insufficient documentation

## 2023-09-26 DIAGNOSIS — J181 Lobar pneumonia, unspecified organism: Principal | ICD-10-CM | POA: Insufficient documentation

## 2023-09-26 DIAGNOSIS — D509 Iron deficiency anemia, unspecified: Secondary | ICD-10-CM | POA: Insufficient documentation

## 2023-09-26 DIAGNOSIS — Z79899 Other long term (current) drug therapy: Secondary | ICD-10-CM | POA: Diagnosis not present

## 2023-09-26 DIAGNOSIS — R5383 Other fatigue: Secondary | ICD-10-CM | POA: Diagnosis not present

## 2023-09-26 DIAGNOSIS — E871 Hypo-osmolality and hyponatremia: Secondary | ICD-10-CM | POA: Insufficient documentation

## 2023-09-26 DIAGNOSIS — R918 Other nonspecific abnormal finding of lung field: Secondary | ICD-10-CM | POA: Diagnosis not present

## 2023-09-26 DIAGNOSIS — I129 Hypertensive chronic kidney disease with stage 1 through stage 4 chronic kidney disease, or unspecified chronic kidney disease: Secondary | ICD-10-CM | POA: Insufficient documentation

## 2023-09-26 DIAGNOSIS — M6281 Muscle weakness (generalized): Secondary | ICD-10-CM | POA: Diagnosis not present

## 2023-09-26 DIAGNOSIS — J189 Pneumonia, unspecified organism: Principal | ICD-10-CM | POA: Diagnosis present

## 2023-09-26 DIAGNOSIS — D7389 Other diseases of spleen: Secondary | ICD-10-CM | POA: Diagnosis not present

## 2023-09-26 DIAGNOSIS — J168 Pneumonia due to other specified infectious organisms: Secondary | ICD-10-CM | POA: Diagnosis not present

## 2023-09-26 DIAGNOSIS — K5730 Diverticulosis of large intestine without perforation or abscess without bleeding: Secondary | ICD-10-CM | POA: Diagnosis not present

## 2023-09-26 DIAGNOSIS — R509 Fever, unspecified: Secondary | ICD-10-CM | POA: Diagnosis not present

## 2023-09-26 DIAGNOSIS — E041 Nontoxic single thyroid nodule: Secondary | ICD-10-CM | POA: Diagnosis not present

## 2023-09-26 DIAGNOSIS — R531 Weakness: Secondary | ICD-10-CM | POA: Diagnosis not present

## 2023-09-26 DIAGNOSIS — J984 Other disorders of lung: Secondary | ICD-10-CM | POA: Diagnosis not present

## 2023-09-26 DIAGNOSIS — R0602 Shortness of breath: Secondary | ICD-10-CM | POA: Diagnosis not present

## 2023-09-26 LAB — CBC WITH DIFFERENTIAL/PLATELET
Abs Immature Granulocytes: 0.02 10*3/uL (ref 0.00–0.07)
Basophils Absolute: 0 10*3/uL (ref 0.0–0.1)
Basophils Relative: 0 %
Eosinophils Absolute: 0 10*3/uL (ref 0.0–0.5)
Eosinophils Relative: 0 %
HCT: 37.1 % (ref 36.0–46.0)
Hemoglobin: 12.2 g/dL (ref 12.0–15.0)
Immature Granulocytes: 0 %
Lymphocytes Relative: 16 %
Lymphs Abs: 1.1 10*3/uL (ref 0.7–4.0)
MCH: 29.4 pg (ref 26.0–34.0)
MCHC: 32.9 g/dL (ref 30.0–36.0)
MCV: 89.4 fL (ref 80.0–100.0)
Monocytes Absolute: 0.8 10*3/uL (ref 0.1–1.0)
Monocytes Relative: 11 %
Neutro Abs: 5 10*3/uL (ref 1.7–7.7)
Neutrophils Relative %: 73 %
Platelets: 217 10*3/uL (ref 150–400)
RBC: 4.15 MIL/uL (ref 3.87–5.11)
RDW: 15.3 % (ref 11.5–15.5)
WBC: 6.9 10*3/uL (ref 4.0–10.5)
nRBC: 0 % (ref 0.0–0.2)

## 2023-09-26 LAB — PROTIME-INR
INR: 1.1 (ref 0.8–1.2)
Prothrombin Time: 14.1 s (ref 11.4–15.2)

## 2023-09-26 LAB — I-STAT CG4 LACTIC ACID, ED
Lactic Acid, Venous: 0.8 mmol/L (ref 0.5–1.9)
Lactic Acid, Venous: 0.7 mmol/L (ref 0.5–1.9)

## 2023-09-26 LAB — COMPREHENSIVE METABOLIC PANEL
ALT: 15 U/L (ref 0–44)
AST: 19 U/L (ref 15–41)
Albumin: 3.3 g/dL — ABNORMAL LOW (ref 3.5–5.0)
Alkaline Phosphatase: 85 U/L (ref 38–126)
Anion gap: 11 (ref 5–15)
BUN: 12 mg/dL (ref 8–23)
CO2: 23 mmol/L (ref 22–32)
Calcium: 8.8 mg/dL — ABNORMAL LOW (ref 8.9–10.3)
Chloride: 92 mmol/L — ABNORMAL LOW (ref 98–111)
Creatinine, Ser: 0.76 mg/dL (ref 0.44–1.00)
GFR, Estimated: >60 mL/min (ref >60)
Glucose, Bld: 163 mg/dL — ABNORMAL HIGH (ref 70–99)
Potassium: 3.3 mmol/L — ABNORMAL LOW (ref 3.5–5.1)
Sodium: 126 mmol/L — ABNORMAL LOW (ref 135–145)
Total Bilirubin: 0.5 mg/dL (ref 0.3–1.2)
Total Protein: 6.9 g/dL (ref 6.5–8.1)

## 2023-09-26 LAB — URINALYSIS, W/ REFLEX TO CULTURE (INFECTION SUSPECTED)
Bilirubin Urine: NEGATIVE
Glucose, UA: NEGATIVE mg/dL
Ketones, ur: NEGATIVE mg/dL
Nitrite: NEGATIVE
Protein, ur: 100 mg/dL — AB
RBC / HPF: >50 RBC/hpf (ref 0–5)
Specific Gravity, Urine: 1.017 (ref 1.005–1.030)
WBC, UA: >50 WBC/hpf (ref 0–5)
pH: 6 (ref 5.0–8.0)

## 2023-09-26 LAB — SARS CORONAVIRUS 2 BY RT PCR: SARS Coronavirus 2 by RT PCR: NEGATIVE

## 2023-09-26 MED ORDER — AZITHROMYCIN 500 MG IV SOLR
500.0000 mg | Freq: Once | INTRAVENOUS | Status: AC
  Administered 2023-09-26: 500 mg via INTRAVENOUS
  Filled 2023-09-26: qty 5

## 2023-09-26 MED ORDER — SODIUM CHLORIDE 0.9 % IV BOLUS
500.0000 mL | Freq: Once | INTRAVENOUS | Status: AC
  Administered 2023-09-26: 500 mL via INTRAVENOUS

## 2023-09-26 MED ORDER — POTASSIUM CHLORIDE CRYS ER 20 MEQ PO TBCR
40.0000 meq | EXTENDED_RELEASE_TABLET | Freq: Once | ORAL | Status: AC
Start: 2023-09-26 — End: 2023-09-26
  Administered 2023-09-26: 40 meq via ORAL
  Filled 2023-09-26: qty 2

## 2023-09-26 MED ORDER — IOHEXOL 350 MG/ML SOLN
100.0000 mL | Freq: Once | INTRAVENOUS | Status: AC | PRN
  Administered 2023-09-26: 100 mL via INTRAVENOUS

## 2023-09-26 MED ORDER — ACETAMINOPHEN 325 MG PO TABS
650.0000 mg | ORAL_TABLET | Freq: Once | ORAL | Status: AC
  Administered 2023-09-26: 650 mg via ORAL
  Filled 2023-09-26: qty 2

## 2023-09-26 MED ORDER — SODIUM CHLORIDE 0.9 % IV SOLN
1.0000 g | Freq: Once | INTRAVENOUS | Status: AC
  Administered 2023-09-26: 1 g via INTRAVENOUS
  Filled 2023-09-26: qty 10

## 2023-09-26 MED ORDER — SODIUM CHLORIDE 0.9 % IV BOLUS: 1000.0000 mL | Freq: Once | INTRAVENOUS | Status: DC

## 2023-09-26 NOTE — ED Provider Notes (Signed)
[place] EMERGENCY DEPARTMENT AT [facility] Provider Note   CSN: [id number] Arrival date & time: 09/26/23  1729     History  Chief Complaint  Patient presents with   Weakness    [name] is a 89 y.o. female.   Weakness Associated symptoms: cough, fever and shortness of breath   Patient presents for fatigue, fevers, shortness of breath, generalized weakness.  Medical history includes anemia, HLD, CKD, GERD, atrial fibrillation, HTN, IBS, osteopenia, prediabetes.  Onset of symptoms was 2 days ago.  Patient typically ambulates without any difficulty.  Over the past 2 days, she has had limited mobility due to her generalized weakness.  She will get lightheaded with standing.  She did not have any falls.  She has had a cough productive of white sputum.  Tmax at home was 102 degrees.  She has had decreased appetite but has been able to eat and drink.  She has not had any nausea or vomiting.  She has had constipation over the past week.  She received an enema earlier today.  She was able to pass a small amount of stool.  She does have a history of intermittent constipation.  Patient denies any recent urine symptoms.    Home Medications Prior to Admission medications   Medication Sig Start Date End Date Taking? Authorizing Provider  albuterol (VENTOLIN HFA) 108 (90 Base) MCG/ACT inhaler Inhale 2 puffs into the lungs 3 - 4 times daily as needed until cough improving, then 4 times daily as needed for wheezing or shortness of breath 04/08/23     benzonatate (TESSALON) 200 MG capsule Take 1 capsule (200 mg total) by mouth 3 (three) times daily as needed for cough 04/08/23     Cholecalciferol (VITAMIN D3) 1000 units CAPS Take 1,000 Units by mouth daily.     [provider]  diclofenac Sodium (VOLTAREN) 1 % GEL Apply 2 grams topically to affected joints of both hands 4 (four) times daily. 08/31/23     fluticasone (FLONASE) 50 MCG/ACT nasal spray Use 2 sprays in each nostril  once daily for sinus/nasal congestion. 02/23/23     gabapentin (NEURONTIN) 300 MG capsule Take 1 capsule (300 mg total) by mouth Nightly for nerve pain. 02/23/23   Hamrick, Durward Fortes, MD  gabapentin (NEURONTIN) 300 MG capsule Take 2 capsules (600 mg total) by mouth every evening for nerve pain. 08/31/23     losartan-hydrochlorothiazide (HYZAAR) 100-25 MG tablet Take 1 tablet by mouth daily. 03/09/23   Jake Bathe, MD  Multiple Vitamins-Minerals (PRESERVISION AREDS PO) Take 1 capsule by mouth in the morning and at bedtime.     [provider]  omeprazole (PRILOSEC OTC) 20 MG tablet Take 20 mg by mouth daily before breakfast.    [provider]  omeprazole (PRILOSEC) 20 MG capsule Take 1 capsule (20 mg total) by mouth daily for acid reflux. 08/31/23     rosuvastatin (CRESTOR) 5 MG tablet Take 1 tablet (5 mg total) by mouth daily. 03/09/23   Jake Bathe, MD  valACYclovir (VALTREX) 1000 MG tablet Take 1,000 mg by mouth 2 (two) times daily as needed (outbreaks).    [provider]  vitamin E 400 UNIT capsule Take 400 Units by mouth daily.    [provider]      Allergies    Adhesive [tape], Clindamycin/lincomycin, Codeine, Dilaudid [hydromorphone hcl], Elemental sulfur, Fenofibrate, Morphine and codeine, and Sulfa antibiotics    Review of Systems   Review of  Systems  Constitutional:  Positive for activity change, appetite change, fatigue and fever.  Respiratory:  Positive for cough and shortness of breath.   Gastrointestinal:  Positive for constipation.  Neurological:  Positive for weakness (Generalized) and light-headedness.  All other systems reviewed and are negative.   Physical Exam Updated Vital Signs BP 126/69   Pulse 79   Temp (!) 100.7 F (38.2 C) (Oral)   Resp 17   SpO2 93%  Physical Exam Vitals and nursing note reviewed.  Constitutional:      General: She is not in acute distress.    Appearance: Normal appearance. She is well-developed. She is  not ill-appearing, toxic-appearing or diaphoretic.  HENT:     Head: Normocephalic and atraumatic.     Right Ear: External ear normal.     Left Ear: External ear normal.     Nose: Nose normal.     Mouth/Throat:     Mouth: Mucous membranes are moist.  Eyes:     Extraocular Movements: Extraocular movements intact.     Conjunctiva/sclera: Conjunctivae normal.  Cardiovascular:     Rate and Rhythm: Normal rate and regular rhythm.     Heart sounds: No murmur heard. Pulmonary:     Effort: Pulmonary effort is normal. No respiratory distress.     Breath sounds: Normal breath sounds. No wheezing or rales.  Abdominal:     General: There is no distension.     Palpations: Abdomen is soft.     Tenderness: There is no abdominal tenderness.  Musculoskeletal:        General: No swelling. Normal range of motion.     Cervical back: Normal range of motion and neck supple.     Right lower leg: No edema.     Left lower leg: No edema.  Skin:    General: Skin is warm and dry.     Capillary Refill: Capillary refill takes less than 2 seconds.     Coloration: Skin is not jaundiced or pale.  Neurological:     General: No focal deficit present.     Mental Status: She is alert and oriented to person, place, and time.     Cranial Nerves: No cranial nerve deficit.     Sensory: No sensory deficit.     Motor: No weakness.     Coordination: Coordination normal.  Psychiatric:        Mood and Affect: Mood normal.        Behavior: Behavior normal.        Thought Content: Thought content normal.        Judgment: Judgment normal.     ED Results / Procedures / Treatments   Labs (all labs ordered are listed, but only abnormal results are displayed) Labs Reviewed  COMPREHENSIVE METABOLIC PANEL - Abnormal; Notable for the following components:      Result Value   Sodium 126 (*)    Potassium 3.3 (*)    Chloride 92 (*)    Glucose, Bld 163 (*)    Calcium 8.8 (*)    Albumin 3.3 (*)    All other components  within normal limits  URINALYSIS, W/ REFLEX TO CULTURE (INFECTION SUSPECTED) - Abnormal; Notable for the following components:   APPearance HAZY (*)    Hgb urine dipstick MODERATE (*)    Protein, ur 100 (*)    Leukocytes,Ua LARGE (*)    Bacteria, UA FEW (*)    All other components within normal limits  SARS CORONAVIRUS 2 BY  RT PCR  CULTURE, BLOOD (ROUTINE X 2)  CULTURE, BLOOD (ROUTINE X 2)  RESP PANEL BY RT-PCR (RSV, FLU A&B, COVID)  RVPGX2  CBC WITH DIFFERENTIAL/PLATELET  PROTIME-INR  I-STAT CG4 LACTIC ACID, ED  I-STAT CG4 LACTIC ACID, ED    EKG EKG Interpretation Date/Time:  Sunday September 26 2023 18:12:29 EDT Ventricular Rate:  83 PR Interval:  149 QRS Duration:  109 QT Interval:  345 QTC Calculation: 406 R Axis:   -23  Text Interpretation: Sinus rhythm Incomplete left bundle branch block Probable left ventricular hypertrophy Anterior ST elevation, probably due to LVH Confirmed by Gloris Manchester ([id number]) on 09/26/2023 9:52:06 PM  Radiology DG Chest 2 View  Result Date: 09/26/2023 CLINICAL DATA:  Weakness and fevers. EXAM: CHEST - 2 VIEW COMPARISON:  05/23/2020 FINDINGS: Heart size and mediastinal contours. No pleural effusion or interstitial edema. Airspace opacity within the central right middle lobe is identified concerning for pneumonia. Left lung appears clear. The visualized osseous structures are unremarkable. IMPRESSION: Right middle lobe airspace opacity concerning for pneumonia. Followup PA and lateral chest X-ray is recommended in 3-4 weeks following trial of antibiotic therapy to ensure resolution and exclude underlying malignancy. Electronically Signed   By: Signa Kell M.D.   On: 09/26/2023 18:46    Procedures Procedures    Medications Ordered in ED Medications  azithromycin (ZITHROMAX) 500 mg in sodium chloride 0.9 % 250 mL IVPB (has no administration in time range)  potassium chloride SA (KLOR-CON M) CR tablet 40 mEq (has no administration in time range)   acetaminophen (TYLENOL) tablet 650 mg (has no administration in time range)  cefTRIAXone (ROCEPHIN) 1 g in sodium chloride 0.9 % 100 mL IVPB (1 g Intravenous New Bag/Given 09/26/23 2238)  sodium chloride 0.9 % bolus 500 mL (500 mLs Intravenous New Bag/Given 09/26/23 2238)  iohexol (OMNIPAQUE) 350 MG/ML injection 100 mL (100 mLs Intravenous Contrast Given 09/26/23 2307)    ED Course/ Medical Decision Making/ A&P                                 Medical Decision Making Amount and/or Complexity of Data Reviewed Radiology: ordered.  Risk Prescription drug management.   This patient presents to the ED for concern of fever, fatigue, cough, shortness of breath, constipation, this involves an extensive number of treatment options, and is a complaint that carries with it a high risk of complications and morbidity.  The differential diagnosis includes pneumonia, PE, viral URI, other infection, dehydration, neoplasm   Co morbidities that complicate the patient evaluation  anemia, HLD, CKD, GERD, atrial fibrillation, HTN, IBS, osteopenia, prediabetes   Additional history obtained:  Additional history obtained from patient's daughter External records from outside source obtained and reviewed including EMR   Lab Tests:  I Ordered, and personally interpreted labs.  The pertinent results include: Hypokalemia and hyponatremia present.  Hemoglobin is normal.  No leukocytosis is present.  Urinalysis shows possible infection, however, contaminated with squamous epithelial cells.   Imaging Studies ordered:  I ordered imaging studies including chest x-ray, CTA chest, CT of abdomen and pelvis I independently visualized and interpreted imaging which showed chest x-ray concerning for right middle lobe pneumonia.  CT imaging pending at time of signout. I agree with the radiologist interpretation   Cardiac Monitoring: / EKG:  The patient was maintained on a cardiac monitor.  I personally viewed  and interpreted the cardiac monitored which showed an underlying rhythm  of: Sinus rhythm  Problem List / ED Course / Critical interventions / Medication management  Patient presenting for cough, shortness of breath, fatigue, weakness, and fevers over the past 2 days.  Week.  Workup was initiated prior to patient being bedded in the ED.  Initial lab work is notable for hyponatremia.  Urinalysis does show bacteria, hematuria, and pyuria.  She denies any recent dysuria.  She has had some urine frequency.  Chest x-ray shows a right middle lobe pneumonia.  Vital signs on arrival are notable for low-grade fever.  She has had fevers at home with Tmax of 102 degrees over the past 2 days.  On assessment, she is well-appearing.  She is currently able to speak in complete sentences.  She is mildly tachypneic.  SpO2 is currently normal on room air.  On lung auscultation, no wheezing or rhonchi is present.  Antibiotics were initiated for treatment of pneumonia.  Ceftriaxone was included to cover possible UTI.  Tylenol was ordered for antipyresis.  Given her constipation for the past week, will obtain CT imaging.  CT imaging was pending at time of signout.  Care of patient was signed out to oncoming ED provider. I ordered medication including IV fluids for hydration; ceftriaxone and azithromycin for pneumonia; potassium chloride for hypokalemia; Tylenol for antipyresis Reevaluation of the patient after these medicines showed that the patient improved I have reviewed the patients home medicines and have made adjustments as needed   Social Determinants of Health:  Lives at home with family        Final Clinical Impression(s) / ED Diagnoses Final diagnoses:  Pneumonia of right middle lobe due to infectious organism    Rx / DC Orders ED Discharge Orders     None         Gloris Manchester, MD 09/26/23 2311

## 2023-09-26 NOTE — ED Provider Triage Note (Signed)
Emergency Medicine Provider Triage Evaluation Note  [name] , Hawkins 89 y.o. female  was evaluated in triage.  Pt complains of fever. Weak, generalized. Pain in BIL forearms. Feels dizzy, off balance. Increase in epistaxis, last today. Could not get out of bed. Can't control bowels, some diarrhea  Review of Systems  Positive:  Negative:   Physical Exam  BP 126/69 (BP Location: Left Arm)   Pulse 88   Temp 99.7 F (37.6 C) (Oral)   Resp (!) 24   SpO2 94%  Gen:   Awake, no distress   Resp:  Normal effort  MSK:   Moves extremities without difficulty  Other:    Medical Decision Making  Medically screening exam initiated at 6:10 PM.  Appropriate orders placed.  [name] was informed that the remainder of the evaluation will be completed by another provider, this initial triage assessment does not replace that evaluation, and the importance of remaining in the ED until their evaluation is complete.  fever   Monica Kalmar A, PA-C 09/26/23 1811

## 2023-09-26 NOTE — ED Triage Notes (Signed)
Pt presents with c/o weakness since last Friday. Pt reports that she was so weak on Friday night that she could not get out of the bed. Pt reports she felt like she could not control her balance. Pt also reports having some nosebleeds but reports a hx of sinus problems.

## 2023-09-27 ENCOUNTER — Other Ambulatory Visit: Payer: Self-pay

## 2023-09-27 ENCOUNTER — Other Ambulatory Visit (HOSPITAL_COMMUNITY): Payer: Self-pay

## 2023-09-27 DIAGNOSIS — J189 Pneumonia, unspecified organism: Secondary | ICD-10-CM | POA: Diagnosis not present

## 2023-09-27 LAB — BASIC METABOLIC PANEL
Anion gap: 9 (ref 5–15)
BUN: 12 mg/dL (ref 8–23)
CO2: 26 mmol/L (ref 22–32)
Calcium: 9 mg/dL (ref 8.9–10.3)
Chloride: 95 mmol/L — ABNORMAL LOW (ref 98–111)
Creatinine, Ser: 0.76 mg/dL (ref 0.44–1.00)
GFR, Estimated: >60 mL/min (ref >60)
Glucose, Bld: 123 mg/dL — ABNORMAL HIGH (ref 70–99)
Potassium: 3.7 mmol/L (ref 3.5–5.1)
Sodium: 130 mmol/L — ABNORMAL LOW (ref 135–145)

## 2023-09-27 LAB — EXPECTORATED SPUTUM ASSESSMENT W GRAM STAIN, RFLX TO RESP C

## 2023-09-27 LAB — CBC
HCT: 35.5 % — ABNORMAL LOW (ref 36.0–46.0)
Hemoglobin: 11.7 g/dL — ABNORMAL LOW (ref 12.0–15.0)
MCH: 29.7 pg (ref 26.0–34.0)
MCHC: 33 g/dL (ref 30.0–36.0)
MCV: 90.1 fL (ref 80.0–100.0)
Platelets: 207 10*3/uL (ref 150–400)
RBC: 3.94 MIL/uL (ref 3.87–5.11)
RDW: 15.3 % (ref 11.5–15.5)
WBC: 6.6 10*3/uL (ref 4.0–10.5)
nRBC: 0 % (ref 0.0–0.2)

## 2023-09-27 LAB — RESP PANEL BY RT-PCR (RSV, FLU A&B, COVID)  RVPGX2
Influenza A by PCR: NEGATIVE
Influenza B by PCR: NEGATIVE
Resp Syncytial Virus by PCR: NEGATIVE
SARS Coronavirus 2 by RT PCR: NEGATIVE

## 2023-09-27 LAB — TSH: TSH: 3.576 u[IU]/mL (ref 0.350–4.500)

## 2023-09-27 LAB — PROTIME-INR
INR: 1 (ref 0.8–1.2)
Prothrombin Time: 13.5 s (ref 11.4–15.2)

## 2023-09-27 LAB — MAGNESIUM: Magnesium: 1.7 mg/dL (ref 1.7–2.4)

## 2023-09-27 LAB — PHOSPHORUS: Phosphorus: 2.6 mg/dL (ref 2.5–4.6)

## 2023-09-27 LAB — OSMOLALITY: Osmolality: 283 mosm/kg (ref 275–295)

## 2023-09-27 MED ORDER — AZITHROMYCIN 250 MG PO TABS: 500.0000 mg | ORAL_TABLET | Freq: Every day | ORAL | Status: DC

## 2023-09-27 MED ORDER — ROSUVASTATIN CALCIUM 5 MG PO TABS
5.0000 mg | ORAL_TABLET | Freq: Every day | ORAL | Status: DC
  Administered 2023-09-27: 5 mg via ORAL
  Filled 2023-09-27 (×2): qty 1

## 2023-09-27 MED ORDER — PANTOPRAZOLE SODIUM 40 MG PO TBEC
40.0000 mg | DELAYED_RELEASE_TABLET | Freq: Every day | ORAL | Status: DC
  Administered 2023-09-27: 40 mg via ORAL
  Filled 2023-09-27: qty 1

## 2023-09-27 MED ORDER — SODIUM CHLORIDE 0.9 % IV SOLN: 2.0000 g | INTRAVENOUS | Status: DC

## 2023-09-27 MED ORDER — ACETAMINOPHEN 500 MG PO TABS: 1000.0000 mg | ORAL_TABLET | Freq: Four times a day (QID) | ORAL | Status: DC | PRN

## 2023-09-27 MED ORDER — AZITHROMYCIN 250 MG PO TABS
500.0000 mg | ORAL_TABLET | Freq: Every day | ORAL | 0 refills | Status: DC
  Filled 2023-09-27: qty 6, 3d supply, fill #0

## 2023-09-27 MED ORDER — ALBUTEROL SULFATE (2.5 MG/3ML) 0.083% IN NEBU: 2.5000 mg | INHALATION_SOLUTION | Freq: Two times a day (BID) | RESPIRATORY_TRACT | Status: DC

## 2023-09-27 MED ORDER — GABAPENTIN 300 MG PO CAPS: 600.0000 mg | ORAL_CAPSULE | Freq: Every evening | ORAL | Status: DC

## 2023-09-27 MED ORDER — ALBUTEROL SULFATE (2.5 MG/3ML) 0.083% IN NEBU
2.5000 mg | INHALATION_SOLUTION | Freq: Four times a day (QID) | RESPIRATORY_TRACT | Status: DC
  Administered 2023-09-27: 2.5 mg via RESPIRATORY_TRACT
  Filled 2023-09-27: qty 3

## 2023-09-27 MED ORDER — SODIUM CHLORIDE 0.9 % IV SOLN: 2.0000 g | Freq: Once | INTRAVENOUS | Status: DC

## 2023-09-27 MED ORDER — CEPHALEXIN 250 MG PO CAPS
250.0000 mg | ORAL_CAPSULE | Freq: Four times a day (QID) | ORAL | 0 refills | Status: AC
  Filled 2023-09-27: qty 12, 3d supply, fill #0

## 2023-09-27 NOTE — Progress Notes (Addendum)
SATURATION QUALIFICATIONS: (This note is used to comply with regulatory documentation for home oxygen)  Patient Saturations on Room Air at Rest = 95%  Patient Saturations on Room Air while Ambulating = 95%  Pt did not require 02 while ambulating. Dr. Natale Milch made aware.

## 2023-09-27 NOTE — Care Management Obs Status (Signed)
MEDICARE OBSERVATION STATUS NOTIFICATION   Patient Details  Name: [name] MRN: [id number] Date of Birth: [birth year]   Medicare Observation Status Notification Given:  Yes    Beckie Busing, RN 09/27/2023, 1:26 PM

## 2023-09-27 NOTE — Care Management CC44 (Signed)
Condition Code 44 Documentation Completed  Patient Details  Name: [name] MRN: [id number] Date of Birth: [birth year]   Condition Code 44 given:  Yes Patient signature on Condition Code 44 notice:  Yes Documentation of 2 MD's agreement:  Yes Code 44 added to claim:  Yes    Beckie Busing, RN 09/27/2023, 1:26 PM

## 2023-09-27 NOTE — ED Notes (Signed)
..ED TO INPATIENT HANDOFF REPORT  ED Nurse Name and Phone #: Lyndel Safe Name/Age/Gender [name] 89 y.o. female Room/Bed: WA07/WA07  Code Status   Code Status: Full Code  Home/SNF/Other Home Patient oriented to: self, place, time, and situation Is this baseline? Yes   Triage Complete: Triage complete  Chief Complaint Community acquired pneumonia [J18.9]  Triage Note Pt presents with c/o weakness since last Friday. Pt reports that she was so weak on Friday night that she could not get out of the bed. Pt reports she felt like she could not control her balance. Pt also reports having some nosebleeds but reports a hx of sinus problems.   Allergies Allergies  Allergen Reactions   Adhesive [Tape] Other (See Comments)    PEELS OFF THE SKIN!!   Clindamycin/Lincomycin Nausea And Vomiting   Codeine Nausea Only   Dilaudid [Hydromorphone Hcl] Nausea And Vomiting   Elemental Sulfur Hives and Other (See Comments)    Should this be "Sulfa" (??)   Fenofibrate Other (See Comments)    Reaction not recalled   Morphine And Codeine Nausea And Vomiting   Sulfa Antibiotics Hives    Level of Care/Admitting Diagnosis ED Disposition     ED Disposition  Admit   Condition  --   Comment  Hospital Area: [facility] [[id number]]  Level of Care: Telemetry [5]  Admit to tele based on following criteria: Other see comments  Comments: Borderline septic  May admit patient to [facility] or [facility] if equivalent level of care is available:: No  Covid Evaluation: Asymptomatic - no recent exposure (last 10 days) testing not required  Diagnosis: Community acquired pneumonia [784696]  Admitting Physician: Dolly Rias [[id number]]  Attending Physician: Dolly Rias [phone]  Certification:: I certify this patient will need inpatient services for at least 2 midnights  Expected Medical Readiness: 09/30/2023          B Medical/Surgery History Past Medical  History:  Diagnosis Date   Anemia    low iron   Chronic renal insufficiency, stage III (moderate) (HCC)    DDD (degenerative disc disease), lumbar    Diverticulosis    Dizziness    Dyspnea    thinks it's due to the back pain   Factor 5 Leiden mutation, heterozygous (HCC)    GERD (gastroesophageal reflux disease)    Herpes simplex infection    History of anemia    History of atrial fibrillation    HTN (hypertension)    IBS (irritable bowel syndrome)    Mixed hyperlipidemia    Osteoarthritis    Osteopenia    PONV (postoperative nausea and vomiting)    Pre-diabetes    Prediabetes    SOB (shortness of breath) on exertion    Spinal stenosis of lumbar region at multiple levels    Past Surgical History:  Procedure Laterality Date   COLONOSCOPY     DENTAL SURGERY     EYE SURGERY Bilateral    cataract surgery with lens implants   KIDNEY SURGERY Left 01/03/2009   left partial nephrectomy (pathology: leiomyoma)   LUMBAR LAMINECTOMY/DECOMPRESSION MICRODISCECTOMY N/A 09/20/2020   Procedure: LUMBAR LAMINECTOMY/DECOMPRESSION MICRODISCECTOMY LUMBAR TWO TO LUMBAR FIVE;  Surgeon: Coletta Memos, MD;  Location: [facility] OR;  Service: Neurosurgery;  Laterality: N/A;   TOTAL KNEE ARTHROPLASTY Left    TOTAL KNEE ARTHROPLASTY Right      A IV Location/Drains/Wounds Patient Lines/Drains/Airways Status     Active Line/Drains/Airways     Name Placement date  Placement time Site Days   Peripheral IV 09/20/20 Posterior;Right Hand 09/20/20  0730  Hand  1102   Peripheral IV 09/26/23 20 G Left Antecubital 09/26/23  2218  Antecubital  1   External Urinary Catheter 05/23/20  2214  --  1222   Incision (Closed) 09/20/20 Back Other (Comment) 09/20/20  1150  -- 1102            Intake/Output Last 24 hours  Intake/Output Summary (Last 24 hours) at 09/27/2023 0430 Last data filed at 09/27/2023 0028 Gross per 24 hour  Intake 851.27 ml  Output --  Net 851.27 ml    Labs/Imaging Results for orders  placed or performed during the hospital encounter of 09/26/23 (from the past 48 hour(s))  Urinalysis, w/ Reflex to Culture (Infection Suspected) -Urine, Clean Catch     Status: Abnormal   Collection Time: 09/26/23  6:25 PM  Result Value Ref Range   Specimen Source URINE, CLEAN CATCH    Color, Urine YELLOW YELLOW   APPearance HAZY (A) CLEAR   Specific Gravity, Urine 1.017 1.005 - 1.030   pH 6.0 5.0 - 8.0   Glucose, UA NEGATIVE NEGATIVE mg/dL   Hgb urine dipstick MODERATE (A) NEGATIVE   Bilirubin Urine NEGATIVE NEGATIVE   Ketones, ur NEGATIVE NEGATIVE mg/dL   Protein, ur 409 (A) NEGATIVE mg/dL   Nitrite NEGATIVE NEGATIVE   Leukocytes,Ua LARGE (A) NEGATIVE   RBC / HPF >50 0 - 5 RBC/hpf   WBC, UA >50 0 - 5 WBC/hpf    Comment:        Reflex urine culture not performed if WBC <=10, OR if Squamous epithelial cells >5. If Squamous epithelial cells >5 suggest recollection.    Bacteria, UA FEW (A) NONE SEEN   Squamous Epithelial / HPF 11-20 0 - 5 /HPF   Mucus PRESENT     Comment: Performed at [facility], [address], [place], [place] [address]  CBC with Differential     Status: None   Collection Time: 09/26/23  6:26 PM  Result Value Ref Range   WBC 6.9 4.0 - 10.5 K/uL   RBC 4.15 3.87 - 5.11 MIL/uL   Hemoglobin 12.2 12.0 - 15.0 g/dL   HCT 47.8 29.5 - 62.1 %   MCV 89.4 80.0 - 100.0 fL   MCH 29.4 26.0 - 34.0 pg   MCHC 32.9 30.0 - 36.0 g/dL   RDW 30.8 65.7 - 84.6 %   Platelets 217 150 - 400 K/uL   nRBC 0.0 0.0 - 0.2 %   Neutrophils Relative % 73 %   Neutro Abs 5.0 1.7 - 7.7 K/uL   Lymphocytes Relative 16 %   Lymphs Abs 1.1 0.7 - 4.0 K/uL   Monocytes Relative 11 %   Monocytes Absolute 0.8 0.1 - 1.0 K/uL   Eosinophils Relative 0 %   Eosinophils Absolute 0.0 0.0 - 0.5 K/uL   Basophils Relative 0 %   Basophils Absolute 0.0 0.0 - 0.1 K/uL   Immature Granulocytes 0 %   Abs Immature Granulocytes 0.02 0.00 - 0.07 K/uL    Comment: Performed at [facility], [address] [place], [place] [address]  Comprehensive metabolic panel     Status: Abnormal   Collection Time: 09/26/23  6:26 PM  Result Value Ref Range   Sodium 126 (L) 135 - 145 mmol/L   Potassium 3.3 (L) 3.5 - 5.1 mmol/L   Chloride 92 (L) 98 - 111 mmol/L   CO2 23  22 - 32 mmol/L   Glucose, Bld 163 (H) 70 - 99 mg/dL    Comment: Glucose reference range applies only to samples taken after fasting for at least 8 hours.   BUN 12 8 - 23 mg/dL   Creatinine, Ser 5.62 0.44 - 1.00 mg/dL   Calcium 8.8 (L) 8.9 - 10.3 mg/dL   Total Protein 6.9 6.5 - 8.1 g/dL   Albumin 3.3 (L) 3.5 - 5.0 g/dL   AST 19 15 - 41 U/L   ALT 15 0 - 44 U/L   Alkaline Phosphatase 85 38 - 126 U/L   Total Bilirubin 0.5 0.3 - 1.2 mg/dL   GFR, Estimated >13 >08 mL/min    Comment: (NOTE) Calculated using the CKD-EPI Creatinine Equation (2021)    Anion gap 11 5 - 15    Comment: Performed at [facility], [address] [place], [place] [address]  SARS Coronavirus 2 by RT PCR (hospital order, performed in [facility] hospital lab) *cepheid single result test* Urine, Clean Catch     Status: None   Collection Time: 09/26/23  6:26 PM   Specimen: Urine, Clean Catch; Nasal Swab  Result Value Ref Range   SARS Coronavirus 2 by RT PCR NEGATIVE NEGATIVE    Comment: (NOTE) SARS-CoV-2 target nucleic acids are NOT DETECTED.  The SARS-CoV-2 RNA is generally detectable in upper and lower respiratory specimens during the acute phase of infection. The lowest concentration of SARS-CoV-2 viral copies this assay can detect is 250 copies / mL. A negative result does not preclude SARS-CoV-2 infection and should not be used as the sole basis for treatment or other patient management decisions.  A negative result may occur with improper specimen collection / handling, submission of specimen other than nasopharyngeal swab, presence of viral mutation(s) within the areas targeted by this assay, and  inadequate number of viral copies (<250 copies / mL). A negative result must be combined with clinical observations, patient history, and epidemiological information.  Fact Sheet for Patients:   RoadLapTop.co.za  Fact Sheet for Healthcare Providers: http://kim-miller.com/  This test is not yet approved or  cleared by the [place] FDA and has been authorized for detection and/or diagnosis of SARS-CoV-2 by FDA under an Emergency Use Authorization (EUA).  This EUA will remain in effect (meaning this test can be used) for the duration of the COVID-19 declaration under Section 564(b)(1) of the Act, 21 U.S.C. section 360bbb-3(b)(1), unless the authorization is terminated or revoked sooner.  Performed at [facility], [address], [place], [place] [address]   Protime-INR     Status: None   Collection Time: 09/26/23  6:26 PM  Result Value Ref Range   Prothrombin Time 14.1 11.4 - 15.2 seconds   INR 1.1 0.8 - 1.2    Comment: (NOTE) INR goal varies based on device and disease states. Performed at [facility], [address] [place], [place] [address]   Blood culture (routine x 2)     Status: None (Preliminary result)   Collection Time: 09/26/23  6:40 PM   Specimen: BLOOD  Result Value Ref Range   Specimen Description      BLOOD SITE NOT SPECIFIED Performed at [facility], [address], [place], [place] [address]    Special Requests      Blood Culture adequate volume BOTTLES DRAWN AEROBIC AND ANAEROBIC Performed at [facility], [address] [place], [place] [address]    Culture  PENDING    Report Status PENDING   I-Stat CG4 Lactic Acid     Status: None   Collection Time: 09/26/23  6:47 PM  Result Value Ref Range   Lactic Acid, Venous 0.8 0.5 - 1.9 mmol/L  Blood culture (routine x 2)     Status: None (Preliminary result)   Collection Time: 09/26/23 10:26 PM    Specimen: BLOOD RIGHT ARM  Result Value Ref Range   Specimen Description      BLOOD RIGHT ARM Performed at [facility], [address] [place], [place] [address]    Special Requests      Blood Culture adequate volume BOTTLES DRAWN AEROBIC AND ANAEROBIC Performed at [facility], [address], [place], [place] [address]    Culture PENDING    Report Status PENDING   Resp panel by RT-PCR (RSV, Flu A&B, Covid) Anterior Nasal Swab     Status: None   Collection Time: 09/26/23 10:33 PM   Specimen: Anterior Nasal Swab  Result Value Ref Range   SARS Coronavirus 2 by RT PCR NEGATIVE NEGATIVE    Comment: (NOTE) SARS-CoV-2 target nucleic acids are NOT DETECTED.  The SARS-CoV-2 RNA is generally detectable in upper respiratory specimens during the acute phase of infection. The lowest concentration of SARS-CoV-2 viral copies this assay can detect is 138 copies/mL. A negative result does not preclude SARS-Cov-2 infection and should not be used as the sole basis for treatment or other patient management decisions. A negative result may occur with  improper specimen collection/handling, submission of specimen other than nasopharyngeal swab, presence of viral mutation(s) within the areas targeted by this assay, and inadequate number of viral copies(<138 copies/mL). A negative result must be combined with clinical observations, patient history, and epidemiological information. The expected result is Negative.  Fact Sheet for Patients:  BloggerCourse.com  Fact Sheet for Healthcare Providers:  SeriousBroker.it  This test is no t yet approved or cleared by the [place] FDA and  has been authorized for detection and/or diagnosis of SARS-CoV-2 by FDA under an Emergency Use Authorization (EUA). This EUA will remain  in effect (meaning this test can be used) for the duration of the COVID-19 declaration under Section  564(b)(1) of the Act, 21 U.S.C.section 360bbb-3(b)(1), unless the authorization is terminated  or revoked sooner.       Influenza A by PCR NEGATIVE NEGATIVE   Influenza B by PCR NEGATIVE NEGATIVE    Comment: (NOTE) The Xpert Xpress SARS-CoV-2/FLU/RSV plus assay is intended as an aid in the diagnosis of influenza from Nasopharyngeal swab specimens and should not be used as a sole basis for treatment. Nasal washings and aspirates are unacceptable for Xpert Xpress SARS-CoV-2/FLU/RSV testing.  Fact Sheet for Patients: BloggerCourse.com  Fact Sheet for Healthcare Providers: SeriousBroker.it  This test is not yet approved or cleared by the [place] FDA and has been authorized for detection and/or diagnosis of SARS-CoV-2 by FDA under an Emergency Use Authorization (EUA). This EUA will remain in effect (meaning this test can be used) for the duration of the COVID-19 declaration under Section 564(b)(1) of the Act, 21 U.S.C. section 360bbb-3(b)(1), unless the authorization is terminated or revoked.     Resp Syncytial Virus by PCR NEGATIVE NEGATIVE    Comment: (NOTE) Fact Sheet for Patients: BloggerCourse.com  Fact Sheet for Healthcare Providers: SeriousBroker.it  This test is not yet approved or cleared by the [place] FDA and has been authorized for detection and/or diagnosis of SARS-CoV-2 by  FDA under an Emergency Use Authorization (EUA). This EUA will remain in effect (meaning this test can be used) for the duration of the COVID-19 declaration under Section 564(b)(1) of the Act, 21 U.S.C. section 360bbb-3(b)(1), unless the authorization is terminated or revoked.  Performed at [facility], [address] [place], [place] [address]   I-Stat CG4 Lactic Acid     Status: None   Collection Time: 09/26/23 10:38 PM  Result Value Ref Range   Lactic Acid,  Venous 0.7 0.5 - 1.9 mmol/L   CT ABDOMEN PELVIS W CONTRAST  Result Date: 09/26/2023 CLINICAL DATA:  Weakness EXAM: CT ABDOMEN AND PELVIS WITH CONTRAST TECHNIQUE: Multidetector CT imaging of the abdomen and pelvis was performed using the standard protocol following bolus administration of intravenous contrast. RADIATION DOSE REDUCTION: This exam was performed according to the departmental dose-optimization program which includes automated exposure control, adjustment of the mA and/or kV according to patient size and/or use of iterative reconstruction technique. CONTRAST:  OMNIPAQUE IOHEXOL 350 MG/ML SOLN COMPARISON:  CT 03/05/2021 FINDINGS: Lower chest: Lung bases demonstrate small minimally dense right pleural effusion. Partial consolidation at the posterior right base and incompletely visualized airspace disease at the right middle lobe. Hepatobiliary: No focal liver abnormality is seen. No gallstones, gallbladder wall thickening, or biliary dilatation. Pancreas: Unremarkable. No pancreatic ductal dilatation or surrounding inflammatory changes. Spleen: 1.7 cm hypodense splenic lesion, previously 1.5 cm. Adrenals/Urinary Tract: Adrenal glands are normal. Kidneys show no hydronephrosis. The bladder is unremarkable Stomach/Bowel: Stomach is within normal limits. Diverticular disease of the colon without acute inflammatory process. No evidence of bowel wall thickening, distention, or inflammatory changes. Vascular/Lymphatic: Advanced aortic atherosclerosis. No aneurysm. No suspicious lymph nodes. Reproductive: Uterus and bilateral adnexa are unremarkable. Other: Negative for pelvic effusion or free air. Musculoskeletal: Scoliosis and degenerative changes of the spine. No acute osseous abnormality IMPRESSION: 1. No CT evidence for acute intra-abdominal or pelvic abnormality. 2. Diverticular disease of the colon without acute inflammatory process. 3. Small minimally dense right pleural effusion with partial  consolidation at the posterior right base and incompletely visualized airspace disease at the right middle lobe. 4. Aortic atherosclerosis. 5. Slight interval increase in size of hypodense splenic lesion, favor benign process but consider MRI follow-up in 6-12 months. Aortic Atherosclerosis (ICD10-I70.0). Electronically Signed   By: Jasmine Pang M.D.   On: 09/26/2023 23:56   CT Angio Chest PE W and/or Wo Contrast  Result Date: 09/26/2023 CLINICAL DATA:  Weakness EXAM: CT ANGIOGRAPHY CHEST WITH CONTRAST TECHNIQUE: Multidetector CT imaging of the chest was performed using the standard protocol during bolus administration of intravenous contrast. Multiplanar CT image reconstructions and MIPs were obtained to evaluate the vascular anatomy. RADIATION DOSE REDUCTION: This exam was performed according to the departmental dose-optimization program which includes automated exposure control, adjustment of the mA and/or kV according to patient size and/or use of iterative reconstruction technique. CONTRAST:  OMNIPAQUE IOHEXOL 350 MG/ML SOLN COMPARISON:  Chest x-ray 09/26/2023, chest CT 08/29/2020, 04/09/2006 FINDINGS: Cardiovascular: Satisfactory opacification of the pulmonary arteries to the segmental level. No evidence of pulmonary embolism. Moderate aortic atherosclerosis. No aneurysm or dissection. Coronary vascular calcification. Mild cardiomegaly. No pericardial effusion Mediastinum/Nodes: Midline trachea. 12 mm exophytic nodule posterior right thyroid, no imaging follow-up is recommended. No suspicious lymph nodes. Esophagus within normal limits Lungs/Pleura: Small right-sided slightly dense pleural effusion without rim enhancement. Mild bilateral mosaic attenuation. Partial right middle lobe consolidation. Mild airspace disease in the posterior right base as well. Scattered pulmonary  nodules, for example 6 mm left lower lobe pulmonary nodule series 12, image 77, previously 5 mm. 6 mm right middle lobe  pulmonary nodule on series 12, image 72, previously 6 mm. Upper Abdomen: See separately dictated abdominal CT Musculoskeletal: No acute osseous abnormality. Review of the MIP images confirms the above findings. IMPRESSION: 1. Negative for acute pulmonary embolus. 2. Small right-sided slightly dense pleural effusion without rim enhancement, possible small volume hemothorax. Partial right middle lobe consolidation and mild airspace disease in the posterior right base, suspicious for pneumonia. Imaging follow-up to resolution is recommended. 3. Mild bilateral mosaic attenuation suggesting small airways disease. 4. Scattered pulmonary nodules measuring up to 6 mm, similar to prior. No specific imaging follow-up recommended for this finding 5. Aortic atherosclerosis. Aortic Atherosclerosis (ICD10-I70.0). Electronically Signed   By: Jasmine Pang M.D.   On: 09/26/2023 23:47   DG Chest 2 View  Result Date: 09/26/2023 CLINICAL DATA:  Weakness and fevers. EXAM: CHEST - 2 VIEW COMPARISON:  05/23/2020 FINDINGS: Heart size and mediastinal contours. No pleural effusion or interstitial edema. Airspace opacity within the central right middle lobe is identified concerning for pneumonia. Left lung appears clear. The visualized osseous structures are unremarkable. IMPRESSION: Right middle lobe airspace opacity concerning for pneumonia. Followup PA and lateral chest X-ray is recommended in 3-4 weeks following trial of antibiotic therapy to ensure resolution and exclude underlying malignancy. Electronically Signed   By: Signa Kell M.D.   On: 09/26/2023 18:46    Pending Labs Unresulted Labs (From admission, onward)     Start     Ordered   09/27/23 0500  Basic metabolic panel  Tomorrow morning,   R        09/27/23 0241   09/27/23 0500  CBC  Tomorrow morning,   R        09/27/23 0241   09/27/23 0500  Magnesium  Tomorrow morning,   R        09/27/23 0241   09/27/23 0500  Phosphorus  Tomorrow morning,   R         09/27/23 0241   09/27/23 0500  Protime-INR  Tomorrow morning,   R        09/27/23 0241   09/27/23 0500  TSH  Tomorrow morning,   R        09/27/23 0241   09/27/23 0500  Osmolality  Tomorrow morning,   R        09/27/23 0244   09/27/23 0245  Osmolality, urine  Once,   R        09/27/23 0244   09/27/23 0245  Sodium, urine, random  Once,   R        09/27/23 0244   09/27/23 0234  Hematocrit  Once,   URGENT       Comments: Do not draw from blood, to be done on pleural fluid    09/27/23 0233            Vitals/Pain Today's Vitals   09/27/23 0145 09/27/23 0300 09/27/23 0330 09/27/23 0400  BP: (!) 140/76 135/69 115/65 (!) 145/64  Pulse: 75 74 65 68  Resp: (!) 25 12 14  (!) 22  Temp:      TempSrc:      SpO2: 92% 94% 95% 95%  PainSc:        Isolation Precautions Airborne and Contact precautions  Medications Medications  azithromycin (ZITHROMAX) tablet 500 mg (has no administration in time range)  cefTRIAXone (ROCEPHIN) 2 g in sodium  chloride 0.9 % 100 mL IVPB (has no administration in time range)  acetaminophen (TYLENOL) tablet 1,000 mg (has no administration in time range)  albuterol (PROVENTIL) (2.5 MG/3ML) 0.083% nebulizer solution 2.5 mg (2.5 mg Nebulization Not Given 09/27/23 0357)  rosuvastatin (CRESTOR) tablet 5 mg (has no administration in time range)  pantoprazole (PROTONIX) EC tablet 40 mg (has no administration in time range)  gabapentin (NEURONTIN) capsule 600 mg (has no administration in time range)  cefTRIAXone (ROCEPHIN) 1 g in sodium chloride 0.9 % 100 mL IVPB (0 g Intravenous Stopped 09/27/23 0028)  azithromycin (ZITHROMAX) 500 mg in sodium chloride 0.9 % 250 mL IVPB (0 mg Intravenous Stopped 09/27/23 0028)  sodium chloride 0.9 % bolus 500 mL (0 mLs Intravenous Stopped 09/27/23 0028)  iohexol (OMNIPAQUE) 350 MG/ML injection 100 mL (100 mLs Intravenous Contrast Given 09/26/23 2307)  potassium chloride SA (KLOR-CON M) CR tablet 40 mEq (40 mEq Oral Given 09/26/23  2326)  acetaminophen (TYLENOL) tablet 650 mg (650 mg Oral Given 09/26/23 2324)    Mobility walks with person assist     Focused Assessments     R Recommendations: See Admitting Provider Note  Report given to:   Additional Notes:

## 2023-09-27 NOTE — Discharge Summary (Signed)
Physician Discharge Summary  [name] ZDG:[id number] DOB: 1930/12/30 DOA: 09/26/2023  PCP: Lonie Peak, PA-C  Admit date: 09/26/2023 Discharge date: 09/27/2023  Admitted From: Home Disposition: Home  Recommendations for Outpatient Follow-up:  Follow up with PCP in 1-2 weeks Repeat chest x-ray/CT in 3 to 4 weeks as discussed to further evaluate prior fluid  Home Health: None Equipment/Devices: None  Discharge Condition: Stable CODE STATUS: Full Diet recommendation: As tolerated, regular diet  Brief/Interim Summary: [name] is a 89 y.o. female with hx of hypertension, hyperlipidemia, who presents with 2 days of generalized weakness and fever at home.    Patient admitted as above with acute hypoxic respiratory failure, right sided pneumonia, does not meet sepsis criteria with transient hypoxia.  She does have somewhat chronic respiratory symptoms with cough, notable mosaic pattern on CT is not acute, noted on CT 2021.  Would recommend close follow-up and review imaging with PCP for possible consult to outpatient pulmonology if appropriate for further evaluation and testing.  Small-volume pleural effusion noted, follow repeat imaging for further indication for aspiration drain or biopsy.  Cultures thus far remain negative, transition patient off of broad-spectrum antibiotics onto azithromycin and ceftriaxone for remainder of course.  She otherwise remains euvolemic, without hypoxia, ambulating today without any symptoms or complaints.  Patient's other chronic comorbid conditions appear to be well-controlled.  Possible splenic lesion also noted on imaging, completely incidental, would consider outpatient MRI versus ultrasound pending follow-up with PCP.  Discharge Diagnoses:  Principal Problem:   Community acquired pneumonia    Discharge Instructions   Allergies as of 09/27/2023       Reactions   Adhesive [tape] Other (See Comments)   PEELS OFF THE SKIN!!    Clindamycin/lincomycin Nausea And Vomiting   Codeine Nausea Only   Dilaudid [hydromorphone Hcl] Nausea And Vomiting   Elemental Sulfur Hives, Other (See Comments)   Should this be "Sulfa" (??)   Fenofibrate Other (See Comments)   Reaction not recalled   Morphine And Codeine Nausea And Vomiting   Sulfa Antibiotics Hives        Medication List     STOP taking these medications    losartan-hydrochlorothiazide 100-25 MG tablet Commonly known as: HYZAAR       TAKE these medications    albuterol 108 (90 Base) MCG/ACT inhaler Commonly known as: VENTOLIN HFA Inhale 2 puffs into the lungs 3 - 4 times daily as needed until cough improving, then 4 times daily as needed for wheezing or shortness of breath   azithromycin 250 MG tablet Commonly known as: ZITHROMAX Take 500 mg (2 tablets) p.o. daily until completed   benzonatate 200 MG capsule Commonly known as: TESSALON Take 1 capsule (200 mg total) by mouth 3 (three) times daily as needed for cough   cephALEXin 250 MG capsule Commonly known as: KEFLEX Take 1 capsule (250 mg total) by mouth 4 (four) times daily for 3 days.   fluticasone 50 MCG/ACT nasal spray Commonly known as: FLONASE Use 2 sprays in each nostril once daily for sinus/nasal congestion.   FT Arthritis Pain 1 % Gel Generic drug: diclofenac Sodium Apply 2 grams topically to affected joints of both hands 4 (four) times daily. What changed:  when to take this reasons to take this   gabapentin 300 MG capsule Commonly known as: NEURONTIN Take 2 capsules (600 mg total) by mouth every evening for nerve pain. What changed: Another medication with the same name was removed. Continue taking this medication, and  follow the directions you see here.   omeprazole 20 MG capsule Commonly known as: PRILOSEC Take 1 capsule (20 mg total) by mouth daily for acid reflux.   PRESERVISION AREDS PO Take 1 capsule by mouth in the morning and at bedtime.   rosuvastatin 5 MG  tablet Commonly known as: CRESTOR Take 1 tablet (5 mg total) by mouth daily.   valACYclovir 1000 MG tablet Commonly known as: VALTREX Take 1,000 mg by mouth 2 (two) times daily as needed (outbreaks).   Vitamin D3 25 MCG (1000 UT) Caps Take 1,000 Units by mouth daily.   vitamin E 180 MG (400 UNITS) capsule Take 400 Units by mouth daily.        Allergies  Allergen Reactions   Adhesive [Tape] Other (See Comments)    PEELS OFF THE SKIN!!   Clindamycin/Lincomycin Nausea And Vomiting   Codeine Nausea Only   Dilaudid [Hydromorphone Hcl] Nausea And Vomiting   Elemental Sulfur Hives and Other (See Comments)    Should this be "Sulfa" (??)   Fenofibrate Other (See Comments)    Reaction not recalled   Morphine And Codeine Nausea And Vomiting   Sulfa Antibiotics Hives    Consultations: None  Procedures/Studies: CT ABDOMEN PELVIS W CONTRAST  Result Date: 09/26/2023 CLINICAL DATA:  Weakness EXAM: CT ABDOMEN AND PELVIS WITH CONTRAST TECHNIQUE: Multidetector CT imaging of the abdomen and pelvis was performed using the standard protocol following bolus administration of intravenous contrast. RADIATION DOSE REDUCTION: This exam was performed according to the departmental dose-optimization program which includes automated exposure control, adjustment of the mA and/or kV according to patient size and/or use of iterative reconstruction technique. CONTRAST:  OMNIPAQUE IOHEXOL 350 MG/ML SOLN COMPARISON:  CT 03/05/2021 FINDINGS: Lower chest: Lung bases demonstrate small minimally dense right pleural effusion. Partial consolidation at the posterior right base and incompletely visualized airspace disease at the right middle lobe. Hepatobiliary: No focal liver abnormality is seen. No gallstones, gallbladder wall thickening, or biliary dilatation. Pancreas: Unremarkable. No pancreatic ductal dilatation or surrounding inflammatory changes. Spleen: 1.7 cm hypodense splenic lesion, previously 1.5 cm.  Adrenals/Urinary Tract: Adrenal glands are normal. Kidneys show no hydronephrosis. The bladder is unremarkable Stomach/Bowel: Stomach is within normal limits. Diverticular disease of the colon without acute inflammatory process. No evidence of bowel wall thickening, distention, or inflammatory changes. Vascular/Lymphatic: Advanced aortic atherosclerosis. No aneurysm. No suspicious lymph nodes. Reproductive: Uterus and bilateral adnexa are unremarkable. Other: Negative for pelvic effusion or free air. Musculoskeletal: Scoliosis and degenerative changes of the spine. No acute osseous abnormality IMPRESSION: 1. No CT evidence for acute intra-abdominal or pelvic abnormality. 2. Diverticular disease of the colon without acute inflammatory process. 3. Small minimally dense right pleural effusion with partial consolidation at the posterior right base and incompletely visualized airspace disease at the right middle lobe. 4. Aortic atherosclerosis. 5. Slight interval increase in size of hypodense splenic lesion, favor benign process but consider MRI follow-up in 6-12 months. Aortic Atherosclerosis (ICD10-I70.0). Electronically Signed   By: Jasmine Pang M.D.   On: 09/26/2023 23:56   CT Angio Chest PE W and/or Wo Contrast  Result Date: 09/26/2023 CLINICAL DATA:  Weakness EXAM: CT ANGIOGRAPHY CHEST WITH CONTRAST TECHNIQUE: Multidetector CT imaging of the chest was performed using the standard protocol during bolus administration of intravenous contrast. Multiplanar CT image reconstructions and MIPs were obtained to evaluate the vascular anatomy. RADIATION DOSE REDUCTION: This exam was performed according to the departmental dose-optimization program which includes automated exposure control, adjustment of the mA  and/or kV according to patient size and/or use of iterative reconstruction technique. CONTRAST:  OMNIPAQUE IOHEXOL 350 MG/ML SOLN COMPARISON:  Chest x-ray 09/26/2023, chest CT 08/29/2020, 04/09/2006  FINDINGS: Cardiovascular: Satisfactory opacification of the pulmonary arteries to the segmental level. No evidence of pulmonary embolism. Moderate aortic atherosclerosis. No aneurysm or dissection. Coronary vascular calcification. Mild cardiomegaly. No pericardial effusion Mediastinum/Nodes: Midline trachea. 12 mm exophytic nodule posterior right thyroid, no imaging follow-up is recommended. No suspicious lymph nodes. Esophagus within normal limits Lungs/Pleura: Small right-sided slightly dense pleural effusion without rim enhancement. Mild bilateral mosaic attenuation. Partial right middle lobe consolidation. Mild airspace disease in the posterior right base as well. Scattered pulmonary nodules, for example 6 mm left lower lobe pulmonary nodule series 12, image 77, previously 5 mm. 6 mm right middle lobe pulmonary nodule on series 12, image 72, previously 6 mm. Upper Abdomen: See separately dictated abdominal CT Musculoskeletal: No acute osseous abnormality. Review of the MIP images confirms the above findings. IMPRESSION: 1. Negative for acute pulmonary embolus. 2. Small right-sided slightly dense pleural effusion without rim enhancement, possible small volume hemothorax. Partial right middle lobe consolidation and mild airspace disease in the posterior right base, suspicious for pneumonia. Imaging follow-up to resolution is recommended. 3. Mild bilateral mosaic attenuation suggesting small airways disease. 4. Scattered pulmonary nodules measuring up to 6 mm, similar to prior. No specific imaging follow-up recommended for this finding 5. Aortic atherosclerosis. Aortic Atherosclerosis (ICD10-I70.0). Electronically Signed   By: Jasmine Pang M.D.   On: 09/26/2023 23:47   DG Chest 2 View  Result Date: 09/26/2023 CLINICAL DATA:  Weakness and fevers. EXAM: CHEST - 2 VIEW COMPARISON:  05/23/2020 FINDINGS: Heart size and mediastinal contours. No pleural effusion or interstitial edema. Airspace opacity within the  central right middle lobe is identified concerning for pneumonia. Left lung appears clear. The visualized osseous structures are unremarkable. IMPRESSION: Right middle lobe airspace opacity concerning for pneumonia. Followup PA and lateral chest X-ray is recommended in 3-4 weeks following trial of antibiotic therapy to ensure resolution and exclude underlying malignancy. Electronically Signed   By: Signa Kell M.D.   On: 09/26/2023 18:46     Subjective: No acute issues or events overnight denies nausea vomiting diarrhea constipation any fevers chills or chest pain   Discharge Exam: Vitals:   09/27/23 0553 09/27/23 0854  BP: 137/64   Pulse: 69   Resp: 16   Temp: 98.3 F (36.8 C)   SpO2: 95% 96%   Vitals:   09/27/23 0521 09/27/23 0553 09/27/23 0604 09/27/23 0854  BP:  137/64    Pulse:  69    Resp:  16    Temp: 98 F (36.7 C) 98.3 F (36.8 C)    TempSrc: Oral Oral    SpO2:  95%  96%  Weight:   89.7 kg   Height:   5\' 7"  (1.702 m)     General: Pt is alert, awake, not in acute distress Cardiovascular: RRR, S1/S2 +, no rubs, no gallops Respiratory: CTA bilaterally, no wheezing, no rhonchi Abdominal: Soft, NT, ND, bowel sounds + Extremities: no edema, no cyanosis    The results of significant diagnostics from this hospitalization (including imaging, microbiology, ancillary and laboratory) are listed below for reference.     Microbiology: Recent Results (from the past 240 hour(s))  SARS Coronavirus 2 by RT PCR (hospital order, performed in [facility] hospital lab) *cepheid single result test* Urine, Clean Catch     Status: None   Collection Time:  09/26/23  6:26 PM   Specimen: Urine, Clean Catch; Nasal Swab  Result Value Ref Range Status   SARS Coronavirus 2 by RT PCR NEGATIVE NEGATIVE Final    Comment: (NOTE) SARS-CoV-2 target nucleic acids are NOT DETECTED.  The SARS-CoV-2 RNA is generally detectable in upper and lower respiratory specimens during the acute phase of  infection. The lowest concentration of SARS-CoV-2 viral copies this assay can detect is 250 copies / mL. A negative result does not preclude SARS-CoV-2 infection and should not be used as the sole basis for treatment or other patient management decisions.  A negative result may occur with improper specimen collection / handling, submission of specimen other than nasopharyngeal swab, presence of viral mutation(s) within the areas targeted by this assay, and inadequate number of viral copies (<250 copies / mL). A negative result must be combined with clinical observations, patient history, and epidemiological information.  Fact Sheet for Patients:   RoadLapTop.co.za  Fact Sheet for Healthcare Providers: http://kim-miller.com/  This test is not yet approved or  cleared by the [place] FDA and has been authorized for detection and/or diagnosis of SARS-CoV-2 by FDA under an Emergency Use Authorization (EUA).  This EUA will remain in effect (meaning this test can be used) for the duration of the COVID-19 declaration under Section 564(b)(1) of the Act, 21 U.S.C. section 360bbb-3(b)(1), unless the authorization is terminated or revoked sooner.  Performed at [facility], [address], [place], [place] [address]   Blood culture (routine x 2)     Status: None (Preliminary result)   Collection Time: 09/26/23  6:40 PM   Specimen: BLOOD  Result Value Ref Range Status   Specimen Description   Final    BLOOD SITE NOT SPECIFIED Performed at [facility], [address] [place], [place] [address]    Special Requests   Final    Blood Culture adequate volume BOTTLES DRAWN AEROBIC AND ANAEROBIC Performed at [facility], [address], [place], [place] [address]    Culture   Final    NO GROWTH < 12 HOURS Performed at [facility], [address] [place], [place] [address]    Report Status  PENDING  Incomplete  Blood culture (routine x 2)     Status: None (Preliminary result)   Collection Time: 09/26/23 10:26 PM   Specimen: BLOOD RIGHT ARM  Result Value Ref Range Status   Specimen Description   Final    BLOOD RIGHT ARM Performed at [facility], [address], [place], [place] [address]    Special Requests   Final    Blood Culture adequate volume BOTTLES DRAWN AEROBIC AND ANAEROBIC Performed at [facility], [address], [place], [place] [address]    Culture PENDING  Incomplete   Report Status PENDING  Incomplete  Resp panel by RT-PCR (RSV, Flu A&B, Covid) Anterior Nasal Swab     Status: None   Collection Time: 09/26/23 10:33 PM   Specimen: Anterior Nasal Swab  Result Value Ref Range Status   SARS Coronavirus 2 by RT PCR NEGATIVE NEGATIVE Final    Comment: (NOTE) SARS-CoV-2 target nucleic acids are NOT DETECTED.  The SARS-CoV-2 RNA is generally detectable in upper respiratory specimens during the acute phase of infection. The lowest concentration of SARS-CoV-2 viral copies this assay can detect is 138 copies/mL. A negative result does not preclude SARS-Cov-2 infection and should not be used as the sole basis for  treatment or other patient management decisions. A negative result may occur with  improper specimen collection/handling, submission of specimen other than nasopharyngeal swab, presence of viral mutation(s) within the areas targeted by this assay, and inadequate number of viral copies(<138 copies/mL). A negative result must be combined with clinical observations, patient history, and epidemiological information. The expected result is Negative.  Fact Sheet for Patients:  BloggerCourse.com  Fact Sheet for Healthcare Providers:  SeriousBroker.it  This test is no t yet approved or cleared by the [place] FDA and  has been authorized for detection and/or diagnosis of  SARS-CoV-2 by FDA under an Emergency Use Authorization (EUA). This EUA will remain  in effect (meaning this test can be used) for the duration of the COVID-19 declaration under Section 564(b)(1) of the Act, 21 U.S.C.section 360bbb-3(b)(1), unless the authorization is terminated  or revoked sooner.       Influenza A by PCR NEGATIVE NEGATIVE Final   Influenza B by PCR NEGATIVE NEGATIVE Final    Comment: (NOTE) The Xpert Xpress SARS-CoV-2/FLU/RSV plus assay is intended as an aid in the diagnosis of influenza from Nasopharyngeal swab specimens and should not be used as a sole basis for treatment. Nasal washings and aspirates are unacceptable for Xpert Xpress SARS-CoV-2/FLU/RSV testing.  Fact Sheet for Patients: BloggerCourse.com  Fact Sheet for Healthcare Providers: SeriousBroker.it  This test is not yet approved or cleared by the [place] FDA and has been authorized for detection and/or diagnosis of SARS-CoV-2 by FDA under an Emergency Use Authorization (EUA). This EUA will remain in effect (meaning this test can be used) for the duration of the COVID-19 declaration under Section 564(b)(1) of the Act, 21 U.S.C. section 360bbb-3(b)(1), unless the authorization is terminated or revoked.     Resp Syncytial Virus by PCR NEGATIVE NEGATIVE Final    Comment: (NOTE) Fact Sheet for Patients: BloggerCourse.com  Fact Sheet for Healthcare Providers: SeriousBroker.it  This test is not yet approved or cleared by the [place] FDA and has been authorized for detection and/or diagnosis of SARS-CoV-2 by FDA under an Emergency Use Authorization (EUA). This EUA will remain in effect (meaning this test can be used) for the duration of the COVID-19 declaration under Section 564(b)(1) of the Act, 21 U.S.C. section 360bbb-3(b)(1), unless the authorization is terminated  or revoked.  Performed at [facility], [address] [place], [place] [address]   Expectorated Sputum Assessment w Gram Stain, Rflx to Resp Cult     Status: None   Collection Time: 09/27/23 10:26 AM   Specimen: Expectorated Sputum  Result Value Ref Range Status   Specimen Description EXPECTORATED SPUTUM  Final   Special Requests NONE  Final   Sputum evaluation   Final    THIS SPECIMEN IS ACCEPTABLE FOR SPUTUM CULTURE Performed at [facility], [address] [place], [place] [address]    Report Status 09/27/2023 FINAL  Final     Labs: BNP (last 3 results) No results for input(s): "BNP" in the last 8760 hours. Basic Metabolic Panel: Recent Labs  Lab 09/26/23 1826 09/27/23 0534  NA 126* 130*  K 3.3* 3.7  CL 92* 95*  CO2 23 26  GLUCOSE 163* 123*  BUN 12 12  CREATININE 0.76 0.76  CALCIUM 8.8* 9.0  MG  --  1.7  PHOS  --  2.6   Liver Function Tests: Recent Labs  Lab 09/26/23 1826  AST 19  ALT 15  ALKPHOS 85  BILITOT 0.5  PROT 6.9  ALBUMIN 3.3*   No results for input(s): "LIPASE", "AMYLASE" in the last 168 hours. No results for input(s): "AMMONIA" in the last 168 hours. CBC: Recent Labs  Lab 09/26/23 1826 09/27/23 0534  WBC 6.9 6.6  NEUTROABS 5.0  --   HGB 12.2 11.7*  HCT 37.1 35.5*  MCV 89.4 90.1  PLT 217 207   Cardiac Enzymes: No results for input(s): "CKTOTAL", "CKMB", "CKMBINDEX", "TROPONINI" in the last 168 hours. BNP: Invalid input(s): "POCBNP" CBG: No results for input(s): "GLUCAP" in the last 168 hours. D-Dimer No results for input(s): "DDIMER" in the last 72 hours. Hgb A1c No results for input(s): "HGBA1C" in the last 72 hours. Lipid Profile No results for input(s): "CHOL", "HDL", "LDLCALC", "TRIG", "CHOLHDL", "LDLDIRECT" in the last 72 hours. Thyroid function studies Recent Labs    09/27/23 0534  TSH 3.576   Anemia work up No results for input(s): "VITAMINB12", "FOLATE", "FERRITIN", "TIBC",  "IRON", "RETICCTPCT" in the last 72 hours. Urinalysis    Component Value Date/Time   COLORURINE YELLOW 09/26/2023 1825   APPEARANCEUR HAZY (A) 09/26/2023 1825   LABSPEC 1.017 09/26/2023 1825   PHURINE 6.0 09/26/2023 1825   GLUCOSEU NEGATIVE 09/26/2023 1825   HGBUR MODERATE (A) 09/26/2023 1825   BILIRUBINUR NEGATIVE 09/26/2023 1825   KETONESUR NEGATIVE 09/26/2023 1825   PROTEINUR 100 (A) 09/26/2023 1825   UROBILINOGEN 0.2 05/09/2008 1148   NITRITE NEGATIVE 09/26/2023 1825   LEUKOCYTESUR LARGE (A) 09/26/2023 1825   Sepsis Labs Recent Labs  Lab 09/26/23 1826 09/27/23 0534  WBC 6.9 6.6   Microbiology Recent Results (from the past 240 hour(s))  SARS Coronavirus 2 by RT PCR (hospital order, performed in [facility] hospital lab) *cepheid single result test* Urine, Clean Catch     Status: None   Collection Time: 09/26/23  6:26 PM   Specimen: Urine, Clean Catch; Nasal Swab  Result Value Ref Range Status   SARS Coronavirus 2 by RT PCR NEGATIVE NEGATIVE Final    Comment: (NOTE) SARS-CoV-2 target nucleic acids are NOT DETECTED.  The SARS-CoV-2 RNA is generally detectable in upper and lower respiratory specimens during the acute phase of infection. The lowest concentration of SARS-CoV-2 viral copies this assay can detect is 250 copies / mL. A negative result does not preclude SARS-CoV-2 infection and should not be used as the sole basis for treatment or other patient management decisions.  A negative result may occur with improper specimen collection / handling, submission of specimen other than nasopharyngeal swab, presence of viral mutation(s) within the areas targeted by this assay, and inadequate number of viral copies (<250 copies / mL). A negative result must be combined with clinical observations, patient history, and epidemiological information.  Fact Sheet for Patients:   RoadLapTop.co.za  Fact Sheet for Healthcare  Providers: http://kim-miller.com/  This test is not yet approved or  cleared by the [place] FDA and has been authorized for detection and/or diagnosis of SARS-CoV-2 by FDA under an Emergency Use Authorization (EUA).  This EUA will remain in effect (meaning this test can be used) for the duration of the COVID-19 declaration under Section 564(b)(1) of the Act, 21 U.S.C. section 360bbb-3(b)(1), unless the authorization is terminated or revoked sooner.  Performed at [facility], [address], [place], [place] [address]   Blood culture (routine x 2)     Status: None (Preliminary result)   Collection Time: 09/26/23  6:40 PM   Specimen: BLOOD  Result Value Ref Range Status   Specimen Description  Final    BLOOD SITE NOT SPECIFIED Performed at [facility], [address], [place], [place] [address]    Special Requests   Final    Blood Culture adequate volume BOTTLES DRAWN AEROBIC AND ANAEROBIC Performed at [facility], [address] [place], [place] [address]    Culture   Final    NO GROWTH < 12 HOURS Performed at [facility], [address], [place], [place] [address]    Report Status PENDING  Incomplete  Blood culture (routine x 2)     Status: None (Preliminary result)   Collection Time: 09/26/23 10:26 PM   Specimen: BLOOD RIGHT ARM  Result Value Ref Range Status   Specimen Description   Final    BLOOD RIGHT ARM Performed at [facility], [address], [place], [place] [address]    Special Requests   Final    Blood Culture adequate volume BOTTLES DRAWN AEROBIC AND ANAEROBIC Performed at [facility], [address], [place], [place] [address]    Culture PENDING  Incomplete   Report Status PENDING  Incomplete  Resp panel by RT-PCR (RSV, Flu A&B, Covid) Anterior Nasal Swab     Status: None   Collection Time: 09/26/23 10:33 PM   Specimen: Anterior Nasal Swab  Result  Value Ref Range Status   SARS Coronavirus 2 by RT PCR NEGATIVE NEGATIVE Final    Comment: (NOTE) SARS-CoV-2 target nucleic acids are NOT DETECTED.  The SARS-CoV-2 RNA is generally detectable in upper respiratory specimens during the acute phase of infection. The lowest concentration of SARS-CoV-2 viral copies this assay can detect is 138 copies/mL. A negative result does not preclude SARS-Cov-2 infection and should not be used as the sole basis for treatment or other patient management decisions. A negative result may occur with  improper specimen collection/handling, submission of specimen other than nasopharyngeal swab, presence of viral mutation(s) within the areas targeted by this assay, and inadequate number of viral copies(<138 copies/mL). A negative result must be combined with clinical observations, patient history, and epidemiological information. The expected result is Negative.  Fact Sheet for Patients:  BloggerCourse.com  Fact Sheet for Healthcare Providers:  SeriousBroker.it  This test is no t yet approved or cleared by the [place] FDA and  has been authorized for detection and/or diagnosis of SARS-CoV-2 by FDA under an Emergency Use Authorization (EUA). This EUA will remain  in effect (meaning this test can be used) for the duration of the COVID-19 declaration under Section 564(b)(1) of the Act, 21 U.S.C.section 360bbb-3(b)(1), unless the authorization is terminated  or revoked sooner.       Influenza A by PCR NEGATIVE NEGATIVE Final   Influenza B by PCR NEGATIVE NEGATIVE Final    Comment: (NOTE) The Xpert Xpress SARS-CoV-2/FLU/RSV plus assay is intended as an aid in the diagnosis of influenza from Nasopharyngeal swab specimens and should not be used as a sole basis for treatment. Nasal washings and aspirates are unacceptable for Xpert Xpress SARS-CoV-2/FLU/RSV testing.  Fact Sheet for  Patients: BloggerCourse.com  Fact Sheet for Healthcare Providers: SeriousBroker.it  This test is not yet approved or cleared by the [place] FDA and has been authorized for detection and/or diagnosis of SARS-CoV-2 by FDA under an Emergency Use Authorization (EUA). This EUA will remain in effect (meaning this test can be used) for the duration of the COVID-19 declaration under Section 564(b)(1) of the Act, 21 U.S.C. section 360bbb-3(b)(1), unless the authorization  is terminated or revoked.     Resp Syncytial Virus by PCR NEGATIVE NEGATIVE Final    Comment: (NOTE) Fact Sheet for Patients: BloggerCourse.com  Fact Sheet for Healthcare Providers: SeriousBroker.it  This test is not yet approved or cleared by the [place] FDA and has been authorized for detection and/or diagnosis of SARS-CoV-2 by FDA under an Emergency Use Authorization (EUA). This EUA will remain in effect (meaning this test can be used) for the duration of the COVID-19 declaration under Section 564(b)(1) of the Act, 21 U.S.C. section 360bbb-3(b)(1), unless the authorization is terminated or revoked.  Performed at [facility], [address], [place], [place] [address]   Expectorated Sputum Assessment w Gram Stain, Rflx to Resp Cult     Status: None   Collection Time: 09/27/23 10:26 AM   Specimen: Expectorated Sputum  Result Value Ref Range Status   Specimen Description EXPECTORATED SPUTUM  Final   Special Requests NONE  Final   Sputum evaluation   Final    THIS SPECIMEN IS ACCEPTABLE FOR SPUTUM CULTURE Performed at [facility], [address] [place], [place] [address]    Report Status 09/27/2023 FINAL  Final     Time coordinating discharge: Over 30 minutes  SIGNED:   Azucena Fallen, DO [facility] 09/27/2023, 12:51 PM Pager   If 7PM-7AM,  please contact night-coverage [email or web address]

## 2023-09-27 NOTE — H&P (Addendum)
History and Physical    [name]:[id number] DOB: 02/20/1931 DOA: 09/26/2023  PCP: Lonie Peak, PA-C   Patient coming from: Home   Chief Complaint:  Chief Complaint  Patient presents with   Weakness    HPI:  [name] is a 89 y.o. female with hx of hypertension, hyperlipidemia, who presents with 2 days of generalized weakness and fever at home.  Reports a chronic history of shortness of breath with right sputum times months which is generally unchanged.  No other cold like symptoms.  No sick contacts.  Developed subjective fever and sleeping most of the time through the day over the weekend.  Her daughter came home in the evening prior to admission became concerned and advised her to go to the emergency department.  Otherwise denies chest pain, no abdominal pain, nausea, vomiting, diarrhea.  Is constipated.  No dysuria.    Review of Systems:  ROS complete and negative except as marked above   Allergies  Allergen Reactions   Adhesive [Tape] Other (See Comments)    PEELS OFF THE SKIN!!   Clindamycin/Lincomycin Nausea And Vomiting   Codeine Nausea Only   Dilaudid [Hydromorphone Hcl] Nausea And Vomiting   Elemental Sulfur Hives and Other (See Comments)    Should this be "Sulfa" (??)   Fenofibrate Other (See Comments)    Reaction not recalled   Morphine And Codeine Nausea And Vomiting   Sulfa Antibiotics Hives    Prior to Admission medications   Medication Sig Start Date End Date Taking? Authorizing Provider  Cholecalciferol (VITAMIN D3) 1000 units CAPS Take 1,000 Units by mouth daily.    Yes [provider]  diclofenac Sodium (VOLTAREN) 1 % GEL Apply 2 grams topically to affected joints of both hands 4 (four) times daily. Patient taking differently: Apply 2 g topically 4 (four) times daily as needed (pain). 08/31/23  Yes   gabapentin (NEURONTIN) 300 MG capsule Take 2 capsules (600 mg total) by mouth every evening for nerve pain. 08/31/23  Yes    losartan-hydrochlorothiazide (HYZAAR) 100-25 MG tablet Take 1 tablet by mouth daily. 03/09/23  Yes Jake Bathe, MD  Multiple Vitamins-Minerals (PRESERVISION AREDS PO) Take 1 capsule by mouth in the morning and at bedtime.    Yes [provider]  omeprazole (PRILOSEC) 20 MG capsule Take 1 capsule (20 mg total) by mouth daily for acid reflux. 08/31/23  Yes   rosuvastatin (CRESTOR) 5 MG tablet Take 1 tablet (5 mg total) by mouth daily. 03/09/23  Yes Jake Bathe, MD  valACYclovir (VALTREX) 1000 MG tablet Take 1,000 mg by mouth 2 (two) times daily as needed (outbreaks).   Yes [provider]  vitamin E 400 UNIT capsule Take 400 Units by mouth daily.   Yes [provider]  albuterol (VENTOLIN HFA) 108 (90 Base) MCG/ACT inhaler Inhale 2 puffs into the lungs 3 - 4 times daily as needed until cough improving, then 4 times daily as needed for wheezing or shortness of breath 04/08/23     benzonatate (TESSALON) 200 MG capsule Take 1 capsule (200 mg total) by mouth 3 (three) times daily as needed for cough 04/08/23     fluticasone (FLONASE) 50 MCG/ACT nasal spray Use 2 sprays in each nostril once daily for sinus/nasal congestion. 02/23/23     gabapentin (NEURONTIN) 300 MG capsule Take 1 capsule (300 mg total) by mouth Nightly for nerve pain. Patient not taking: Reported on 09/27/2023 02/23/23   Hamrick, Durward Fortes, MD  Past Medical History:  Diagnosis Date   Anemia    low iron   Chronic renal insufficiency, stage III (moderate) (HCC)    DDD (degenerative disc disease), lumbar    Diverticulosis    Dizziness    Dyspnea    thinks it's due to the back pain   Factor 5 Leiden mutation, heterozygous (HCC)    GERD (gastroesophageal reflux disease)    Herpes simplex infection    History of anemia    History of atrial fibrillation    HTN (hypertension)    IBS (irritable bowel syndrome)    Mixed hyperlipidemia    Osteoarthritis    Osteopenia    PONV (postoperative nausea and vomiting)     Pre-diabetes    Prediabetes    SOB (shortness of breath) on exertion    Spinal stenosis of lumbar region at multiple levels     Past Surgical History:  Procedure Laterality Date   COLONOSCOPY     DENTAL SURGERY     EYE SURGERY Bilateral    cataract surgery with lens implants   KIDNEY SURGERY Left 01/03/2009   left partial nephrectomy (pathology: leiomyoma)   LUMBAR LAMINECTOMY/DECOMPRESSION MICRODISCECTOMY N/A 09/20/2020   Procedure: LUMBAR LAMINECTOMY/DECOMPRESSION MICRODISCECTOMY LUMBAR TWO TO LUMBAR FIVE;  Surgeon: Coletta Memos, MD;  Location: [facility] OR;  Service: Neurosurgery;  Laterality: N/A;   TOTAL KNEE ARTHROPLASTY Left    TOTAL KNEE ARTHROPLASTY Right      reports that she has never smoked. She has never used smokeless tobacco. She reports that she does not drink alcohol and does not use drugs.  Family History  Problem Relation Age of Onset   Heart attack Mother    Diabetes Mother    Heart attack Father    Other Father        aminolysis of kidney     Physical Exam: Vitals:   09/27/23 0500 09/27/23 0521 09/27/23 0553 09/27/23 0604  BP: (!) 149/74  137/64   Pulse: 71  69   Resp: (!) 22  16   Temp:  98 F (36.7 C) 98.3 F (36.8 C)   TempSrc:  Oral Oral   SpO2: 91%  95%   Weight:    89.7 kg  Height:    5\' 7"  (1.702 m)    Gen: Awake, alert, NAD  CV: Regular, normal S1, S2, no murmurs  Resp: Normal WOB, on room air, diminished breath sounds throughout the right posterior fields.  Otherwise clear Abd: Round, slightly distended, normoactive, nontender MSK: Symmetric, no edema  Skin: No rashes or lesions to exposed skin  Neuro: Alert and interactive, fully oriented Psych: euthymic, appropriate    Data review:   Labs reviewed, notable for:   NA 126 K3.3 Hyperglycemic WBC 6 UA contaminated  Micro:  Results for orders placed or performed during the hospital encounter of 09/26/23  SARS Coronavirus 2 by RT PCR (hospital order, performed in [facility]  hospital lab) *cepheid single result test* Urine, Clean Catch     Status: None   Collection Time: 09/26/23  6:26 PM   Specimen: Urine, Clean Catch; Nasal Swab  Result Value Ref Range Status   SARS Coronavirus 2 by RT PCR NEGATIVE NEGATIVE Final    Comment: (NOTE) SARS-CoV-2 target nucleic acids are NOT DETECTED.  The SARS-CoV-2 RNA is generally detectable in upper and lower respiratory specimens during the acute phase of infection. The lowest concentration of SARS-CoV-2 viral copies this assay can detect is 250 copies / mL. A negative result  does not preclude SARS-CoV-2 infection and should not be used as the sole basis for treatment or other patient management decisions.  A negative result may occur with improper specimen collection / handling, submission of specimen other than nasopharyngeal swab, presence of viral mutation(s) within the areas targeted by this assay, and inadequate number of viral copies (<250 copies / mL). A negative result must be combined with clinical observations, patient history, and epidemiological information.  Fact Sheet for Patients:   RoadLapTop.co.za  Fact Sheet for Healthcare Providers: http://kim-miller.com/  This test is not yet approved or  cleared by the [place] FDA and has been authorized for detection and/or diagnosis of SARS-CoV-2 by FDA under an Emergency Use Authorization (EUA).  This EUA will remain in effect (meaning this test can be used) for the duration of the COVID-19 declaration under Section 564(b)(1) of the Act, 21 U.S.C. section 360bbb-3(b)(1), unless the authorization is terminated or revoked sooner.  Performed at [facility], [address], [place], [place] [address]   Blood culture (routine x 2)     Status: None (Preliminary result)   Collection Time: 09/26/23  6:40 PM   Specimen: BLOOD  Result Value Ref Range Status   Specimen Description   Final     BLOOD SITE NOT SPECIFIED Performed at [facility], [address], [place], [place] [address]    Special Requests   Final    Blood Culture adequate volume BOTTLES DRAWN AEROBIC AND ANAEROBIC Performed at [facility], [address] [place], [place] [address]    Culture   Final    NO GROWTH < 12 HOURS Performed at [facility], [address], [place], [place] [address]    Report Status PENDING  Incomplete  Blood culture (routine x 2)     Status: None (Preliminary result)   Collection Time: 09/26/23 10:26 PM   Specimen: BLOOD RIGHT ARM  Result Value Ref Range Status   Specimen Description   Final    BLOOD RIGHT ARM Performed at [facility], [address], [place], [place] [address]    Special Requests   Final    Blood Culture adequate volume BOTTLES DRAWN AEROBIC AND ANAEROBIC Performed at [facility], [address] [place], [place] [address]    Culture PENDING  Incomplete   Report Status PENDING  Incomplete  Resp panel by RT-PCR (RSV, Flu A&B, Covid) Anterior Nasal Swab     Status: None   Collection Time: 09/26/23 10:33 PM   Specimen: Anterior Nasal Swab  Result Value Ref Range Status   SARS Coronavirus 2 by RT PCR NEGATIVE NEGATIVE Final    Comment: (NOTE) SARS-CoV-2 target nucleic acids are NOT DETECTED.  The SARS-CoV-2 RNA is generally detectable in upper respiratory specimens during the acute phase of infection. The lowest concentration of SARS-CoV-2 viral copies this assay can detect is 138 copies/mL. A negative result does not preclude SARS-Cov-2 infection and should not be used as the sole basis for treatment or other patient management decisions. A negative result may occur with  improper specimen collection/handling, submission of specimen other than nasopharyngeal swab, presence of viral mutation(s) within the areas targeted by this assay, and inadequate number of viral copies(<138 copies/mL). A  negative result must be combined with clinical observations, patient history, and epidemiological information. The expected result is Negative.  Fact Sheet for Patients:  BloggerCourse.com  Fact Sheet for Healthcare Providers:  SeriousBroker.it  This test is  no t yet approved or cleared by the [place] and  has been authorized for detection and/or diagnosis of SARS-CoV-2 by FDA under an Emergency Use Authorization (EUA). This EUA will remain  in effect (meaning this test can be used) for the duration of the COVID-19 declaration under Section 564(b)(1) of the Act, 21 U.S.C.section 360bbb-3(b)(1), unless the authorization is terminated  or revoked sooner.       Influenza A by PCR NEGATIVE NEGATIVE Final   Influenza B by PCR NEGATIVE NEGATIVE Final    Comment: (NOTE) The Xpert Xpress SARS-CoV-2/FLU/RSV plus assay is intended as an aid in the diagnosis of influenza from Nasopharyngeal swab specimens and should not be used as a sole basis for treatment. Nasal washings and aspirates are unacceptable for Xpert Xpress SARS-CoV-2/FLU/RSV testing.  Fact Sheet for Patients: BloggerCourse.com  Fact Sheet for Healthcare Providers: SeriousBroker.it  This test is not yet approved or cleared by the [place] FDA and has been authorized for detection and/or diagnosis of SARS-CoV-2 by FDA under an Emergency Use Authorization (EUA). This EUA will remain in effect (meaning this test can be used) for the duration of the COVID-19 declaration under Section 564(b)(1) of the Act, 21 U.S.C. section 360bbb-3(b)(1), unless the authorization is terminated or revoked.     Resp Syncytial Virus by PCR NEGATIVE NEGATIVE Final    Comment: (NOTE) Fact Sheet for Patients: BloggerCourse.com  Fact Sheet for Healthcare  Providers: SeriousBroker.it  This test is not yet approved or cleared by the [place] FDA and has been authorized for detection and/or diagnosis of SARS-CoV-2 by FDA under an Emergency Use Authorization (EUA). This EUA will remain in effect (meaning this test can be used) for the duration of the COVID-19 declaration under Section 564(b)(1) of the Act, 21 U.S.C. section 360bbb-3(b)(1), unless the authorization is terminated or revoked.  Performed at [facility], [address], [place], [place] [address]     Imaging reviewed:  CT ABDOMEN PELVIS W CONTRAST  Result Date: 09/26/2023 CLINICAL DATA:  Weakness EXAM: CT ABDOMEN AND PELVIS WITH CONTRAST TECHNIQUE: Multidetector CT imaging of the abdomen and pelvis was performed using the standard protocol following bolus administration of intravenous contrast. RADIATION DOSE REDUCTION: This exam was performed according to the departmental dose-optimization program which includes automated exposure control, adjustment of the mA and/or kV according to patient size and/or use of iterative reconstruction technique. CONTRAST:  OMNIPAQUE IOHEXOL 350 MG/ML SOLN COMPARISON:  CT 03/05/2021 FINDINGS: Lower chest: Lung bases demonstrate small minimally dense right pleural effusion. Partial consolidation at the posterior right base and incompletely visualized airspace disease at the right middle lobe. Hepatobiliary: No focal liver abnormality is seen. No gallstones, gallbladder wall thickening, or biliary dilatation. Pancreas: Unremarkable. No pancreatic ductal dilatation or surrounding inflammatory changes. Spleen: 1.7 cm hypodense splenic lesion, previously 1.5 cm. Adrenals/Urinary Tract: Adrenal glands are normal. Kidneys show no hydronephrosis. The bladder is unremarkable Stomach/Bowel: Stomach is within normal limits. Diverticular disease of the colon without acute inflammatory process. No evidence of bowel  wall thickening, distention, or inflammatory changes. Vascular/Lymphatic: Advanced aortic atherosclerosis. No aneurysm. No suspicious lymph nodes. Reproductive: Uterus and bilateral adnexa are unremarkable. Other: Negative for pelvic effusion or free air. Musculoskeletal: Scoliosis and degenerative changes of the spine. No acute osseous abnormality IMPRESSION: 1. No CT evidence for acute intra-abdominal or pelvic abnormality. 2. Diverticular disease of the colon without acute inflammatory process. 3. Small minimally dense right pleural effusion with partial consolidation at the posterior right base  and incompletely visualized airspace disease at the right middle lobe. 4. Aortic atherosclerosis. 5. Slight interval increase in size of hypodense splenic lesion, favor benign process but consider MRI follow-up in 6-12 months. Aortic Atherosclerosis (ICD10-I70.0). Electronically Signed   By: Jasmine Pang M.D.   On: 09/26/2023 23:56   CT Angio Chest PE W and/or Wo Contrast  Result Date: 09/26/2023 CLINICAL DATA:  Weakness EXAM: CT ANGIOGRAPHY CHEST WITH CONTRAST TECHNIQUE: Multidetector CT imaging of the chest was performed using the standard protocol during bolus administration of intravenous contrast. Multiplanar CT image reconstructions and MIPs were obtained to evaluate the vascular anatomy. RADIATION DOSE REDUCTION: This exam was performed according to the departmental dose-optimization program which includes automated exposure control, adjustment of the mA and/or kV according to patient size and/or use of iterative reconstruction technique. CONTRAST:  OMNIPAQUE IOHEXOL 350 MG/ML SOLN COMPARISON:  Chest x-ray 09/26/2023, chest CT 08/29/2020, 04/09/2006 FINDINGS: Cardiovascular: Satisfactory opacification of the pulmonary arteries to the segmental level. No evidence of pulmonary embolism. Moderate aortic atherosclerosis. No aneurysm or dissection. Coronary vascular calcification. Mild cardiomegaly. No  pericardial effusion Mediastinum/Nodes: Midline trachea. 12 mm exophytic nodule posterior right thyroid, no imaging follow-up is recommended. No suspicious lymph nodes. Esophagus within normal limits Lungs/Pleura: Small right-sided slightly dense pleural effusion without rim enhancement. Mild bilateral mosaic attenuation. Partial right middle lobe consolidation. Mild airspace disease in the posterior right base as well. Scattered pulmonary nodules, for example 6 mm left lower lobe pulmonary nodule series 12, image 77, previously 5 mm. 6 mm right middle lobe pulmonary nodule on series 12, image 72, previously 6 mm. Upper Abdomen: See separately dictated abdominal CT Musculoskeletal: No acute osseous abnormality. Review of the MIP images confirms the above findings. IMPRESSION: 1. Negative for acute pulmonary embolus. 2. Small right-sided slightly dense pleural effusion without rim enhancement, possible small volume hemothorax. Partial right middle lobe consolidation and mild airspace disease in the posterior right base, suspicious for pneumonia. Imaging follow-up to resolution is recommended. 3. Mild bilateral mosaic attenuation suggesting small airways disease. 4. Scattered pulmonary nodules measuring up to 6 mm, similar to prior. No specific imaging follow-up recommended for this finding 5. Aortic atherosclerosis. Aortic Atherosclerosis (ICD10-I70.0). Electronically Signed   By: Jasmine Pang M.D.   On: 09/26/2023 23:47   DG Chest 2 View  Result Date: 09/26/2023 CLINICAL DATA:  Weakness and fevers. EXAM: CHEST - 2 VIEW COMPARISON:  05/23/2020 FINDINGS: Heart size and mediastinal contours. No pleural effusion or interstitial edema. Airspace opacity within the central right middle lobe is identified concerning for pneumonia. Left lung appears clear. The visualized osseous structures are unremarkable. IMPRESSION: Right middle lobe airspace opacity concerning for pneumonia. Followup PA and lateral chest X-ray is  recommended in 3-4 weeks following trial of antibiotic therapy to ensure resolution and exclude underlying malignancy. Electronically Signed   By: Signa Kell M.D.   On: 09/26/2023 18:46      ED Course:  Treated with ceftriaxone, azithromycin for community-acquired pneumonia.  Pulmonary cc IV fluid, Tylenol, KCl   Assessment/Plan:  89 y.o. female with hx  hypertension, hyperlipidemia, who presents with 2 days of generalized weakness and fever at home, months of unchanged shortness of breath and cough.  Found to have likely community-acquired pneumonia, associated with a complex parapneumonic effusion with question of hemothorax.   Community-acquired pneumonia associated with a complex R parapneumonic effusion with question of hemothorax Her more acute symptoms consistent with likely community-acquired pneumonia. Unclear why having more chronic resp symptoms, does have  mosaic attenuation ? Obstructive process in background, less likely but consideration of malignancy with possible hemorrhagic effusion. Febrile to 38.2, tachypneic, on room air.  Lactate within normal limit.  CTA PE with right middle lobe and right lower lobe consolidation concerning for pneumonia, right-sided dense pleural effusion with questionable small volume hemothorax - Bcx pending, Flu / COVID / rsv neg, check sputum culture  - Continued ceftriaxone 2 g IV every 24 hours, azithromycin 500 mg daily for now - If worsening signs of sepsis would broaden antibiotic to cover for possible empyema - Status post 500 cc fluids, lactate is normal.  Hold off on large-volume fluid resuscitation - IR thoracentesis ordered with labs including cell count, culture, pH, LDH, protein, triglyceride, hematocrit, cytology  - NPO for possible thoracentesis   Hyponatremia, moderate, asymptomatic,  Na initially 126 -> 130 after IVF, suggestive of hypovolemic with low solute. 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  -  check sOsm, uOsm, uNa to further characterize  - Trend BMP   Hypokalemia, repleted   Findings: Splenic lesion 1.7 cm, consider repeat MRI outpatient depending on goals Pulmonary nodules, up to 6 mm, no f/u recommended per rads. See above re: complex effusion   Chronic medical problems: Hypertension: Hold home losartan/hydrochlorothiazide HLD: Continue home Rosuvastatin  ? Neuropathy: Continue home Gabapentin    Body mass index is 30.97 kg/m. Obesity class I complicating medical care per above    DVT prophylaxis:  SCDs Code Status:  Full Code Diet:  Diet Orders (From admission, onward)     Start     Ordered   09/27/23 0241  Diet NPO time specified Except for: Sips with Meds  Diet effective now       Question:  Except for  Answer:  Clearance Coots with Meds   09/27/23 0241           Family Communication:  No   Consults:  IR thora   Admission status:   Inpatient, Telemetry bed  Severity of Illness: The appropriate patient status for this patient is INPATIENT. Inpatient status is judged to be reasonable and necessary in order to provide the required intensity of service to ensure the patient's safety. The patient's presenting symptoms, physical exam findings, and initial radiographic and laboratory data in the context of their chronic comorbidities is felt to place them at high risk for further clinical deterioration. Furthermore, it is not anticipated that the patient will be medically stable for discharge from the hospital within 2 midnights of admission.   * I certify that at the point of admission it is my clinical judgment that the patient will require inpatient hospital care spanning beyond 2 midnights from the point of admission due to high intensity of service, high risk for further deterioration and high frequency of surveillance required.*   Dolly Rias, MD [facility]  How to contact the [facility] Attending or Consulting provider 7A - 7P or covering provider during  after hours 7P -7A, for this patient.  Check the care team in [facility] and look for a) attending/consulting [facility] provider listed and b) the [facility] team listed Log into [email or web address] and use [facility] universal password to access. If you do not have the password, please contact the hospital operator. Locate the [facility] provider you are looking for under [facility] and page to a number that you can be directly reached. If you still have difficulty reaching the provider, please page the [facility] (Director on Call) for the Hospitalists  listed on amion for assistance.  09/27/2023, 8:07 AM

## 2023-09-28 LAB — CULTURE, BLOOD (ROUTINE X 2): Special Requests: ADEQUATE

## 2023-09-28 LAB — BLOOD CULTURE ID PANEL (REFLEXED) - BCID2
A.calcoaceticus-baumannii: NOT DETECTED
Bacteroides fragilis: NOT DETECTED
CTX-M ESBL: NOT DETECTED
Candida albicans: NOT DETECTED
Candida auris: NOT DETECTED
Candida glabrata: NOT DETECTED
Candida krusei: NOT DETECTED
Candida parapsilosis: NOT DETECTED
Candida tropicalis: NOT DETECTED
Carbapenem resist OXA 48 LIKE: NOT DETECTED
Carbapenem resistance IMP: NOT DETECTED
Carbapenem resistance KPC: NOT DETECTED
Carbapenem resistance NDM: NOT DETECTED
Carbapenem resistance VIM: NOT DETECTED
Cryptococcus neoformans/gattii: NOT DETECTED
Enterobacter cloacae complex: NOT DETECTED
Enterobacterales: NOT DETECTED
Enterococcus Faecium: NOT DETECTED
Enterococcus faecalis: NOT DETECTED
Escherichia coli: NOT DETECTED
Haemophilus influenzae: NOT DETECTED
Klebsiella aerogenes: NOT DETECTED
Klebsiella oxytoca: NOT DETECTED
Klebsiella pneumoniae: NOT DETECTED
Listeria monocytogenes: NOT DETECTED
Neisseria meningitidis: NOT DETECTED
Proteus species: NOT DETECTED
Pseudomonas aeruginosa: NOT DETECTED
Salmonella species: NOT DETECTED
Serratia marcescens: NOT DETECTED
Staphylococcus aureus (BCID): NOT DETECTED
Staphylococcus epidermidis: NOT DETECTED
Staphylococcus lugdunensis: NOT DETECTED
Staphylococcus species: DETECTED — AB
Stenotrophomonas maltophilia: NOT DETECTED
Streptococcus agalactiae: NOT DETECTED
Streptococcus pneumoniae: NOT DETECTED
Streptococcus pyogenes: NOT DETECTED
Streptococcus species: NOT DETECTED

## 2023-09-29 LAB — CULTURE, RESPIRATORY W GRAM STAIN: Culture: NORMAL

## 2023-10-02 LAB — CULTURE, BLOOD (ROUTINE X 2)
Culture: NO GROWTH
Special Requests: ADEQUATE

## 2023-10-05 DIAGNOSIS — J189 Pneumonia, unspecified organism: Secondary | ICD-10-CM | POA: Diagnosis not present

## 2023-10-05 DIAGNOSIS — I10 Essential (primary) hypertension: Secondary | ICD-10-CM | POA: Diagnosis not present

## 2023-10-05 DIAGNOSIS — J90 Pleural effusion, not elsewhere classified: Secondary | ICD-10-CM | POA: Diagnosis not present

## 2023-10-05 DIAGNOSIS — R0602 Shortness of breath: Secondary | ICD-10-CM | POA: Diagnosis not present

## 2023-10-15 ENCOUNTER — Ambulatory Visit
Admission: RE | Admit: 2023-10-15 | Discharge: 2023-10-15 | Disposition: A | Discharge status: Home / Self Care | Payer: PPO | Source: Ambulatory Visit | Attending: Family Medicine | Admitting: Family Medicine

## 2023-10-15 ENCOUNTER — Other Ambulatory Visit: Payer: Self-pay | Admitting: Family Medicine

## 2023-10-15 DIAGNOSIS — J189 Pneumonia, unspecified organism: Secondary | ICD-10-CM | POA: Diagnosis not present

## 2023-10-19 ENCOUNTER — Other Ambulatory Visit (HOSPITAL_COMMUNITY): Payer: Self-pay

## 2023-11-16 ENCOUNTER — Other Ambulatory Visit: Payer: Self-pay

## 2023-11-17 ENCOUNTER — Other Ambulatory Visit: Payer: Self-pay

## 2023-11-22 DIAGNOSIS — Z1231 Encounter for screening mammogram for malignant neoplasm of breast: Secondary | ICD-10-CM | POA: Diagnosis not present

## 2023-12-02 ENCOUNTER — Other Ambulatory Visit (HOSPITAL_BASED_OUTPATIENT_CLINIC_OR_DEPARTMENT_OTHER): Payer: Self-pay

## 2023-12-17 ENCOUNTER — Ambulatory Visit: Payer: PPO | Admitting: Pulmonary Disease

## 2023-12-17 ENCOUNTER — Encounter: Payer: Self-pay | Admitting: Pulmonary Disease

## 2023-12-17 VITALS — BP 122/60 | HR 73 | Temp 98.0°F | Ht 67.0 in | Wt 197.6 lb

## 2023-12-17 DIAGNOSIS — R040 Epistaxis: Secondary | ICD-10-CM | POA: Diagnosis not present

## 2023-12-17 DIAGNOSIS — R918 Other nonspecific abnormal finding of lung field: Secondary | ICD-10-CM

## 2023-12-17 DIAGNOSIS — R0602 Shortness of breath: Secondary | ICD-10-CM

## 2023-12-17 NOTE — Progress Notes (Signed)
Subjective:    Patient ID: [name], female    DOB: [birth year], 89 y.o.   MRN: [id number]  Patient Care Team: Lonie Peak, PA-C as PCP - General (Physician Assistant) Jake Bathe, MD as PCP - Cardiology (Cardiology)  Chief Complaint  Patient presents with   Consult    09/26/2023 hospital admission due to pneumonia. Shortness of breath on exertion and occasional at rest. CT scan shows lung nodule.     BACKGROUND: 89 year old lifelong never smoker presents for evaluation of an abnormal chest CT and shortness of breath.  She is kindly referred by Dr. Burnell Blanks, her primary care provider is Lonie Peak, PA-C   HPI Discussed the use of AI scribe software for clinical note transcription with the patient, who gave verbal consent to proceed.  History of Present Illness   The patient, a 89 year old lifelong non-smoker, presents for evaluation of shortness of breath and undisclosed findings on chest imaging.  In the past, the patient's primary care physician had suggested a possible diagnosis of asthma approximately two years ago and provided an inhaler, presumed to be albuterol, but no further action was taken. The patient contracted COVID-19 and experienced significant shortness of breath during this period.  The patient has a history of lung nodules, first identified on a chest x-ray during a hospital admission two years ago. A follow-up was recommended after six months, but this was inadvertently overlooked.  Likely due to issues with the COVID-19 pandemic.  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  This was a leiomyoma.  The patient's shortness of breath is particularly noticeable at night, especially when lying on the right side. The use of a humidifier seems to alleviate this symptom. The patient also reports frequent nosebleeds and a persistently dry nose.  Aside from the humidifier in her room she also uses a Neti pot due to chronic sinus  issues.  The patient has been more active recently, engaging in more exercises and walking, which seems to have lessened the shortness of breath. The patient has a history of atrial fibrillation following a knee replacement due to blood loss she has not had recurrence of this issue.   The patient was admitted to [facility] on 26 September 2023 through 27 September 2023 for pneumonia at that time she had a noted right-sided infiltrate and a very small pleural effusion.  She also had some groundglass nodules that were previously noted but had not been followed up upon.  We are asked to render opinion on these issues.   Review of Systems A 10 point review of systems was performed and it is as noted above otherwise negative.   Past Medical History:  Diagnosis Date   Anemia    low iron   Chronic renal insufficiency, stage III (moderate) (HCC)    DDD (degenerative disc disease), lumbar    Diverticulosis    Dizziness    Dyspnea    thinks it's due to the back pain   Factor 5 Leiden mutation, heterozygous (HCC)    GERD (gastroesophageal reflux disease)    Herpes simplex infection    History of anemia    History of atrial fibrillation    HTN (hypertension)    IBS (irritable bowel syndrome)    Mixed hyperlipidemia    Osteoarthritis    Osteopenia    PONV (postoperative nausea and vomiting)    Pre-diabetes    Prediabetes    SOB (shortness of  breath) on exertion    Spinal stenosis of lumbar region at multiple levels     Past Surgical History:  Procedure Laterality Date   COLONOSCOPY     DENTAL SURGERY     EYE SURGERY Bilateral    cataract surgery with lens implants   KIDNEY SURGERY Left 01/03/2009   left partial nephrectomy (pathology: leiomyoma)   LUMBAR LAMINECTOMY/DECOMPRESSION MICRODISCECTOMY N/A 09/20/2020   Procedure: LUMBAR LAMINECTOMY/DECOMPRESSION MICRODISCECTOMY LUMBAR TWO TO LUMBAR FIVE;  Surgeon: Coletta Memos, MD;  Location: [facility] OR;  Service: Neurosurgery;   Laterality: N/A;   TOTAL KNEE ARTHROPLASTY Left    TOTAL KNEE ARTHROPLASTY Right     Patient Active Problem List   Diagnosis Date Noted   Community acquired pneumonia 09/27/2023   Pneumonia 09/27/2023   Family history of factor V Leiden mutation 10/13/2021   Lumbar stenosis with neurogenic claudication 09/20/2020   Chest pain 05/24/2020   Pulmonary nodule 05/24/2020   HLD (hyperlipidemia) 05/24/2020   Chest pain, atypical 02/23/2017   Disequilibrium 02/23/2017   Essential hypertension 02/23/2017   Dizziness 02/12/2017   Chronic headaches 02/12/2017   Lightheadedness 02/12/2017   Chest tightness 02/12/2017   Shoulder pain 02/12/2017   Shortness of breath 02/12/2017    Family History  Problem Relation Age of Onset   Heart attack Mother    Diabetes Mother    Heart attack Father    Other Father        aminolysis of kidney    Social History   Tobacco Use   Smoking status: Never   Smokeless tobacco: Never  Substance Use Topics   Alcohol use: No    Allergies  Allergen Reactions   Adhesive [Tape] Other (See Comments)    PEELS OFF THE SKIN!!   Clindamycin/Lincomycin Nausea And Vomiting   Codeine Nausea Only   Dilaudid [Hydromorphone Hcl] Nausea And Vomiting   Elemental Sulfur Hives and Other (See Comments)    Should this be "Sulfa" (??)   Fenofibrate Other (See Comments)    Reaction not recalled   Morphine And Codeine Nausea And Vomiting   Sulfa Antibiotics Hives    Current Meds  Medication Sig   albuterol (VENTOLIN HFA) 108 (90 Base) MCG/ACT inhaler Inhale 2 puffs into the lungs 3 - 4 times daily as needed until cough improving, then 4 times daily as needed for wheezing or shortness of breath   Cholecalciferol (VITAMIN D3) 1000 units CAPS Take 1,000 Units by mouth daily.    diclofenac Sodium (VOLTAREN) 1 % GEL Apply 2 grams topically to affected joints of both hands 4 (four) times daily. (Patient taking differently: Apply 2 g topically 4 (four) times daily as  needed (pain).)   fluticasone (FLONASE) 50 MCG/ACT nasal spray Use 2 sprays in each nostril once daily for sinus/nasal congestion.   gabapentin (NEURONTIN) 300 MG capsule Take 2 capsules (600 mg total) by mouth every evening for nerve pain.   Multiple Vitamins-Minerals (PRESERVISION AREDS PO) Take 1 capsule by mouth in the morning and at bedtime.    omeprazole (PRILOSEC) 20 MG capsule Take 1 capsule (20 mg total) by mouth daily for acid reflux.   rosuvastatin (CRESTOR) 5 MG tablet Take 1 tablet (5 mg total) by mouth daily.   valACYclovir (VALTREX) 1000 MG tablet Take 1,000 mg by mouth 2 (two) times daily as needed (outbreaks).   vitamin E 400 UNIT capsule Take 400 Units by mouth daily.    Immunization History  Administered Date(s) Administered   Influenza-Unspecified 08/01/2023  Objective:   BP 122/60 (BP Location: Left Arm, Patient Position: Sitting, Cuff Size: Normal)   Pulse 73   Temp 98 F (36.7 C) (Temporal)   Ht 5\' 7"  (1.702 m)   Wt 197 lb 9.6 oz (89.6 kg)   SpO2 96%   BMI 30.95 kg/m   SpO2: 96 %  GENERAL: Well-developed, overweight woman, no acute distress.  Ambulates with assistance of an upright walker.  No conversational dyspnea. HEAD: Normocephalic, atraumatic.  EYES: Pupils equal, round, reactive to light.  No scleral icterus.  NOSE: Boggy turbinates with inflamed appearing mucosa. MOUTH: Dentition intact.  Oral mucosa moist.  No thrush. NECK: Supple. No thyromegaly. Trachea midline. No JVD.  No adenopathy. PULMONARY: Good air entry bilaterally.  No adventitious sounds. CARDIOVASCULAR: S1 and S2. Regular rate and rhythm.  No rubs, murmurs or gallops heard. ABDOMEN: Obese, otherwise benign. MUSCULOSKELETAL: No joint deformity, no clubbing, no edema.  NEUROLOGIC: No overt focal deficit, no gait disturbance, speech is fluent. SKIN: Intact,warm,dry. PSYCH: Mood and behavior normal.  Representative images from CT angio chest performed 26 September 2023 show right  middle lobe consolidation and posterior right base small right-sided pleural effusion without rim-enhancing and and scattered pulmonary nodules between 5 to 6 mm:       Assessment & Plan:     ICD-10-CM   1. Multiple lung nodules on CT  R91.8 CT CHEST WO CONTRAST    Pulmonary Function Test [facility] Only    2. Shortness of breath  R06.02     3. Epistaxis, recurrent  R04.0      Orders Placed This Encounter  Procedures   CT CHEST WO CONTRAST    Standing Status:   Future    Expiration Date:   12/16/2024    Preferred imaging location?:   [facility]   Pulmonary Function Test [facility] Only    Standing Status:   Future    Expiration Date:   12/16/2024    Scheduling Instructions:     Do prior to return to clinic in 6 to 8 weeks    Full PFT: includes the following: basic spirometry, spirometry pre & post bronchodilator, diffusion capacity (DLCO), lung volumes:   Full PFT    This test can only be performed at:   [facility]   Discussion:    Shortness of Breath Chronic dyspnea exacerbated by lying on the right side and physical exertion. Likely multifactorial, including asthma, previous pneumonia, and potential cardiac issues. No acute distress noted. Discussed potential cardiac causes and the importance of cardiology follow-up. Pulmonary function tests and chest CT will help determine the cause and guide treatment. - Order pulmonary function tests - Order chest CT without contrast - Follow up with cardiologist in April  Pulmonary Nodules Multiple pulmonary nodules likely residual from past pneumonia and COVID-19 infection. No new concerning features. Explained these nodules are likely scars from previous infections and are not currently concerning. Plan to repeat chest CT to ensure stability. - Order chest CT without contrast - Review results in 6-8 weeks  Epistaxis Recurrent epistaxis likely secondary to nasal dryness. No signs of active bleeding or other concerning features.  Discussed the use of saline nasal spray and gel to maintain nasal moisture and prevent further bleeding. - Recommend saline nasal spray ([place]) for daytime use - Recommend saline gel for nighttime use  General Health Maintenance Engaging in regular physical activity. No additional health maintenance issues discussed. - Continue regular physical activity - Follow up with cardiologist in April  Follow-up - Schedule follow-up appointment in 6-8 weeks.      Advised if symptoms do not improve or worsen, to please contact office for sooner follow up or seek emergency care.    I spent 61 minutes of dedicated to the care of this patient on the date of this encounter to include pre-visit review of records, face-to-face time with the patient discussing conditions above, post visit ordering of testing, clinical documentation with the electronic health record, making appropriate referrals as documented, and communicating necessary findings to members of the patients care team.   C. Danice Goltz, MD Advanced Bronchoscopy PCCM [facility]    *This note was dictated using voice recognition software/Dragon.  Despite best efforts to proofread, errors can occur which can change the meaning. Any transcriptional errors that result from this process are unintentional and may not be fully corrected at the time of dictation.

## 2023-12-17 NOTE — Patient Instructions (Signed)
VISIT SUMMARY:  During today's visit, we discussed your shortness of breath, pulmonary nodules, and recurrent nosebleeds. We reviewed your history of lung issues and recent increase in physical activity. We also talked about the importance of follow-up tests and consultations to better understand and manage your symptoms.  YOUR PLAN:  -SHORTNESS OF BREATH: Your shortness of breath, especially when lying on your right side or during physical exertion, may be due to multiple factors including asthma, past pneumonia, and potential heart issues. We will conduct pulmonary function tests and a chest CT scan to help determine the cause. It's also important to follow up with your cardiologist in April.  -PULMONARY NODULES: The nodules in your lungs are likely scars from past infections like pneumonia and COVID-19. These are not currently concerning, but we will repeat a chest CT scan to ensure they remain stable. We will review the results in 6-8 weeks.  -EPISTAXIS (NOSEBLEEDS): Your frequent nosebleeds are likely due to nasal dryness. To help with this, use a saline nasal spray during the day and a saline gel at night to keep your nasal passages moist.  -GENERAL HEALTH MAINTENANCE: You are doing well with your regular physical activity. Continue to stay active and follow up with your cardiologist in April.  INSTRUCTIONS:  Please schedule a follow-up appointment in 6-8 weeks. Additionally, ensure you follow up with your cardiologist in April. We will also need to conduct pulmonary function tests and a chest CT scan without contrast to better understand your symptoms.

## 2023-12-23 DIAGNOSIS — M65352 Trigger finger, left little finger: Secondary | ICD-10-CM | POA: Diagnosis not present

## 2023-12-23 DIAGNOSIS — M65351 Trigger finger, right little finger: Secondary | ICD-10-CM | POA: Diagnosis not present

## 2023-12-23 DIAGNOSIS — M79622 Pain in left upper arm: Secondary | ICD-10-CM | POA: Diagnosis not present

## 2023-12-23 DIAGNOSIS — M79621 Pain in right upper arm: Secondary | ICD-10-CM | POA: Diagnosis not present

## 2023-12-23 DIAGNOSIS — E1121 Type 2 diabetes mellitus with diabetic nephropathy: Secondary | ICD-10-CM | POA: Diagnosis not present

## 2023-12-23 DIAGNOSIS — M7918 Myalgia, other site: Secondary | ICD-10-CM | POA: Diagnosis not present

## 2023-12-23 DIAGNOSIS — R202 Paresthesia of skin: Secondary | ICD-10-CM | POA: Diagnosis not present

## 2023-12-28 ENCOUNTER — Ambulatory Visit: Payer: PPO | Attending: Pulmonary Disease

## 2023-12-28 ENCOUNTER — Ambulatory Visit
Admission: RE | Admit: 2023-12-28 | Discharge: 2023-12-28 | Disposition: A | Discharge status: Home / Self Care | Payer: PPO | Source: Ambulatory Visit | Attending: Pulmonary Disease | Admitting: Pulmonary Disease

## 2023-12-28 DIAGNOSIS — R918 Other nonspecific abnormal finding of lung field: Secondary | ICD-10-CM | POA: Insufficient documentation

## 2023-12-28 DIAGNOSIS — E041 Nontoxic single thyroid nodule: Secondary | ICD-10-CM | POA: Diagnosis not present

## 2023-12-28 LAB — PULMONARY FUNCTION TEST ARMC ONLY
DL/VA % pred: 82 %
DL/VA: 3.24 ml/min/mmHg/L
DLCO unc % pred: 71 %
DLCO unc: 14.4 ml/min/mmHg
FEF 25-75 Post: 2.41 L/s
FEF 25-75 Pre: 2.33 L/s
FEF2575-%Change-Post: 3 %
FEF2575-%Pred-Post: 228 %
FEF2575-%Pred-Pre: 221 %
FEV1-%Change-Post: 0 %
FEV1-%Pred-Post: 103 %
FEV1-%Pred-Pre: 102 %
FEV1-Post: 1.93 L
FEV1-Pre: 1.92 L
FEV1FVC-%Change-Post: 2 %
FEV1FVC-%Pred-Pre: 118 %
FEV6-%Change-Post: -2 %
FEV6-%Pred-Post: 93 %
FEV6-%Pred-Pre: 95 %
FEV6-Post: 2.22 L
FEV6-Pre: 2.27 L
FEV6FVC-%Pred-Post: 106 %
FEV6FVC-%Pred-Pre: 106 %
FVC-%Change-Post: -2 %
FVC-%Pred-Post: 87 %
FVC-%Pred-Pre: 89 %
FVC-Post: 2.22 L
FVC-Pre: 2.27 L
Post FEV1/FVC ratio: 87 %
Post FEV6/FVC ratio: 100 %
Pre FEV1/FVC ratio: 85 %
Pre FEV6/FVC Ratio: 100 %
RV % pred: 80 %
RV: 2.2 L
TLC % pred: 85 %
TLC: 4.69 L

## 2023-12-28 MED ORDER — ALBUTEROL SULFATE (2.5 MG/3ML) 0.083% IN NEBU
2.5000 mg | INHALATION_SOLUTION | Freq: Once | RESPIRATORY_TRACT | Status: AC
  Filled 2023-12-28: qty 3

## 2024-01-04 ENCOUNTER — Other Ambulatory Visit: Payer: Self-pay

## 2024-02-03 ENCOUNTER — Encounter: Payer: Self-pay | Admitting: Pulmonary Disease

## 2024-02-03 ENCOUNTER — Other Ambulatory Visit: Payer: Self-pay

## 2024-02-03 ENCOUNTER — Ambulatory Visit: Payer: PPO | Admitting: Pulmonary Disease

## 2024-02-03 ENCOUNTER — Other Ambulatory Visit (HOSPITAL_COMMUNITY): Payer: Self-pay

## 2024-02-03 VITALS — BP 148/70 | HR 74 | Temp 96.9°F | Ht 67.0 in | Wt 195.4 lb

## 2024-02-03 DIAGNOSIS — R0600 Dyspnea, unspecified: Secondary | ICD-10-CM

## 2024-02-03 DIAGNOSIS — J0191 Acute recurrent sinusitis, unspecified: Secondary | ICD-10-CM

## 2024-02-03 DIAGNOSIS — R918 Other nonspecific abnormal finding of lung field: Secondary | ICD-10-CM

## 2024-02-03 DIAGNOSIS — K449 Diaphragmatic hernia without obstruction or gangrene: Secondary | ICD-10-CM | POA: Diagnosis not present

## 2024-02-03 MED ORDER — AZITHROMYCIN 250 MG PO TABS
ORAL_TABLET | ORAL | 0 refills | Status: AC
  Filled 2024-02-03: qty 6, 5d supply, fill #0

## 2024-02-03 NOTE — Progress Notes (Signed)
 Subjective:    Patient ID: [name], female    DOB: Oct 05, 1931, 89 y.o.   MRN: [id number]  Patient Care Team: Lonie Peak, PA-C as PCP - General (Physician Assistant) Jake Bathe, MD as PCP - Cardiology (Cardiology)  Chief Complaint  Patient presents with   Follow-up    DOE and SOB at night. Occasional wheezing. Cough with green.     BACKGROUND/INTERVAL: 89 year old lifelong never smoker presents for follow-up of an abnormal chest CT and shortness of breath.  Shortness of breath is positional only occurring when she lays on her right side.  She was initially seen here on 17 December 2023.  At that time chest CT without contrast and PFTs were requested.  We reviewed those studies with the patient today.  HPI Discussed the use of AI scribe software for clinical note transcription with the patient, who gave verbal consent to proceed.  History of Present Illness   [name] is a 89 year old female who presents with positional dyspnea and cough.  She experiences shortness of breath when lying on her right side, she has had this issue for years.  Upon review of her films today with the patient that is noted that she does have a hiatal hernia that tends to on her right side.  This may be the cause of her discomfort. She manages this by lying on her left side instead.  She has not noted significant reflux symptoms.  Other than laying on her right side she does not endorse dyspnea at all.  She has a persistent cough producing green sputum for the past six to ten weeks, possibly since her episode of pneumonia.  Currently she has had no fever, chills or sweats. Her pulmonary function tests show a very mild decrease in diffusion capacity, which is attributed to age, it is very mild.  She has chronic sinus problems that persist throughout the year, with a history of sinus issues every fall. She performs nasal washes regularly.  Her past medical history includes lung nodules, which have  been present for a long time and are not currently causing concern.  Her most recent CT chest was reviewed independently and with the patient and her daughter who accompanies her today.  CT chest shows full resolution of the previously noted pneumonia.  She has several solid pulmonary nodules scattered throughout both lungs unchanged from 08/29/2020 of these are benign.  As noted she does have a hiatal hernia that tends to protrude on the right.    Review of Systems A 10 point review of systems was performed and it is as noted above otherwise negative.   Patient Active Problem List   Diagnosis Date Noted   Community acquired pneumonia 09/27/2023   Pneumonia 09/27/2023   Family history of factor V Leiden mutation 10/13/2021   Lumbar stenosis with neurogenic claudication 09/20/2020   Chest pain 05/24/2020   Pulmonary nodule 05/24/2020   HLD (hyperlipidemia) 05/24/2020   Chest pain, atypical 02/23/2017   Disequilibrium 02/23/2017   Essential hypertension 02/23/2017   Dizziness 02/12/2017   Chronic headaches 02/12/2017   Lightheadedness 02/12/2017   Chest tightness 02/12/2017   Shoulder pain 02/12/2017   Shortness of breath 02/12/2017    Social History   Tobacco Use   Smoking status: Never   Smokeless tobacco: Never  Substance Use Topics   Alcohol use: No    Allergies  Allergen Reactions   Adhesive [Tape] Other (See Comments)    PEELS OFF THE  SKIN!!   Clindamycin/Lincomycin Nausea And Vomiting   Codeine Nausea Only   Dilaudid [Hydromorphone Hcl] Nausea And Vomiting   Elemental Sulfur Hives and Other (See Comments)    Should this be "Sulfa" (??)   Fenofibrate Other (See Comments)    Reaction not recalled   Morphine And Codeine Nausea And Vomiting   Sulfa Antibiotics Hives    Current Meds  Medication Sig   albuterol (VENTOLIN HFA) 108 (90 Base) MCG/ACT inhaler Inhale 2 puffs into the lungs 3 - 4 times daily as needed until cough improving, then 4 times daily as needed  for wheezing or shortness of breath   Cholecalciferol (VITAMIN D3) 1000 units CAPS Take 1,000 Units by mouth daily.    diclofenac Sodium (VOLTAREN) 1 % GEL Apply 2 grams topically to affected joints of both hands 4 (four) times daily. (Patient taking differently: Apply 2 g topically 4 (four) times daily as needed (pain).)   fluticasone (FLONASE) 50 MCG/ACT nasal spray Use 2 sprays in each nostril once daily for sinus/nasal congestion.   gabapentin (NEURONTIN) 300 MG capsule Take 2 capsules (600 mg total) by mouth every evening for nerve pain.   Multiple Vitamins-Minerals (PRESERVISION AREDS PO) Take 1 capsule by mouth in the morning and at bedtime.    omeprazole (PRILOSEC) 20 MG capsule Take 1 capsule (20 mg total) by mouth daily for acid reflux.   rosuvastatin (CRESTOR) 5 MG tablet Take 1 tablet (5 mg total) by mouth daily.   valACYclovir (VALTREX) 1000 MG tablet Take 1,000 mg by mouth 2 (two) times daily as needed (outbreaks).   vitamin E 400 UNIT capsule Take 400 Units by mouth daily.    Immunization History  Administered Date(s) Administered   Influenza-Unspecified 08/01/2023        Objective:     BP (!) 148/70 (BP Location: Right Arm, Cuff Size: Normal)   Pulse 74   Temp (!) 96.9 F (36.1 C)   Ht 5\' 7"  (1.702 m)   Wt 195 lb 6.4 oz (88.6 kg)   SpO2 94%   BMI 30.60 kg/m   SpO2: 94 % O2 Device: None (Room air)  GENERAL: Well-developed, overweight woman, no acute distress.  Ambulates with assistance of an upright walker.  No conversational dyspnea. HEAD: Normocephalic, atraumatic.  EYES: Pupils equal, round, reactive to light.  No scleral icterus.  NOSE: Boggy turbinates with inflamed appearing mucosa. MOUTH: Dentition intact.  Oral mucosa moist.  No thrush. NECK: Supple. No thyromegaly. Trachea midline. No JVD.  No adenopathy. PULMONARY: Good air entry bilaterally.  No adventitious sounds. CARDIOVASCULAR: S1 and S2. Regular rate and rhythm.  No rubs, murmurs or gallops  heard. ABDOMEN: Obese, otherwise benign. MUSCULOSKELETAL: No joint deformity, no clubbing, no edema.  NEUROLOGIC: No overt focal deficit, no gait disturbance, speech is fluent. SKIN: Intact,warm,dry. PSYCH: Mood and behavior normal.  CT scan from 28 December 2023 was reviewed independently, images shown to the patient and daughter.      Assessment & Plan:     ICD-10-CM   1. Dyspnea, unspecified type  R06.00    Positional    2. Multiple lung nodules on CT  R91.8    Stable since 2021, deemed benign    3. Acute recurrent sinusitis, unspecified location  J01.91    May need referral to ENT Treat with azithromycin    4. Hiatal hernia  K44.9      Meds ordered this encounter  Medications   azithromycin (ZITHROMAX) 250 MG tablet    Sig:  Take 2 tablets (500 mg) on  Day 1,  followed by 1 tablet (250 mg) once daily on Days 2 through 5.    Dispense:  6 each    Refill:  0   Discussion:    Positional dyspnea Positional dyspnea likely due to a hiatal hernia, which protrudes mostly on the right side, causing discomfort when lying on that side.  Pulmonary function tests are bring, with a mild decrease in oxygen diffusion capacity attributed to age, not affecting oxygen carrying capacity (very mild defect). - Advise avoiding lying on the right side to prevent symptom aggravation.  Chronic sinusitis Chronic sinusitis with green sputum production for 6-10 weeks, possibly since pneumonia. Potential colonization with Staphylococcus due to chronic sinus issues is a possibility. Prescribed antibiotic has anti-inflammatory effects, which may help reduce symptoms.  Consider referral to ENT if symptoms persist - Prescribe antibiotic with anti-inflammatory effect. - Consult primary care physician for potential referral to ENT if sinus issues persist.  Lung nodules Lung nodules present for a long time, considered benign and not requiring further CT imaging unless significant changes  occur.  Pneumonia Previous CT in October showed pneumonia, which has since resolved. Current CT shows no signs of pneumonia.  Follow-up Plan to monitor ongoing issues and response to treatment. - Schedule follow-up appointment in 6 months. - Contact the clinic if symptoms do not improve.     Advised if symptoms do not improve or worsen, to please contact office for sooner follow up or seek emergency care.    I spent 30 minutes of dedicated to the care of this patient on the date of this encounter to include pre-visit review of records, face-to-face time with the patient discussing conditions above, post visit ordering of testing, clinical documentation with the electronic health record, making appropriate referrals as documented, and communicating necessary findings to members of the patients care team.     C. Danice Goltz, MD Advanced Bronchoscopy PCCM [facility]    *This note was generated using voice recognition software/Dragon and/or AI transcription program.  Despite best efforts to proofread, errors can occur which can change the meaning. Any transcriptional errors that result from this process are unintentional and may not be fully corrected at the time of dictation.

## 2024-02-03 NOTE — Patient Instructions (Signed)
 VISIT SUMMARY:  Today, we discussed your shortness of breath when lying on your right side, your persistent cough with green sputum, and your chronic sinus issues. We also reviewed your history of lung nodules and pneumonia.  YOUR PLAN:  -POSITIONAL DYSPNEA: Positional dyspnea means experiencing shortness of breath when lying in certain positions. In your case, it is likely due to your hiatal hernia, which causes discomfort when you lie on your right side. To manage this, you should avoid lying on your right side.  -CHRONIC SINUSITIS: Chronic sinusitis is a long-term inflammation of the sinuses, which can cause symptoms like a persistent cough and green sputum. You have been prescribed an antibiotic with anti-inflammatory effects to help reduce these symptoms. If your sinus issues persist, you may need a referral to an ear, nose, and throat specialist.  -LUNG NODULES: Lung nodules are small growths in the lungs that are usually benign. Your lung nodules have been present for a long time and are not currently causing concern. No further imaging is needed unless there are significant changes.  -PNEUMONIA: Pneumonia is an infection that inflames the air sacs in one or both lungs. Your previous pneumonia has resolved, and your current CT scan shows no signs of pneumonia.  INSTRUCTIONS:  Please schedule a follow-up appointment in 6 months. Contact the clinic if your symptoms do not improve.  For more information, you can read your full clinical note, available in your patient portal.

## 2024-02-18 ENCOUNTER — Encounter: Payer: Self-pay | Admitting: Diagnostic Neuroimaging

## 2024-02-18 ENCOUNTER — Ambulatory Visit: Payer: PPO | Admitting: Diagnostic Neuroimaging

## 2024-02-18 VITALS — BP 150/68 | HR 68 | Ht 67.0 in | Wt 195.0 lb

## 2024-02-18 DIAGNOSIS — G629 Polyneuropathy, unspecified: Secondary | ICD-10-CM

## 2024-02-18 NOTE — Progress Notes (Signed)
 [facility]  PATIENT: [name] DOB: 1931/11/23  REFERRING CLINICIAN: Hamrick, Durward Fortes, MD HISTORY FROM: patient  REASON FOR VISIT: new consult   HISTORICAL  CHIEF COMPLAINT:  Chief Complaint  Patient presents with   RM 6    Patient is here with her daughter to address intermittent paresthesias of both hands and feet. She states she has been told she has neuropathy but it hasn't been confirmed. Symptoms include numbness, tingling, and pain. As the day goes on the symptoms get worse. Symptoms go up both arms into neck. Has tingling between R hip and knee.    HISTORY OF PRESENT ILLNESS:   89 year old female here for evaluation of numbness and tingling.  For past 7 or 8 months patient has noticed some numbness and tingling in the fingertips.  She has at least 4 to 5 years of numbness and tingling in the toes and feet.  Previously diagnosed with lumbar spinal stenosis in 2021 status post surgery.  Symptoms gradual worsened over time.  Now she feels numbness in all of her fingertips.  This has affected her ability to do some of her activities such as sewing and gardening.  Has some chronic neck and low back pain issues.  Went to orthopedic clinic previously and apparently shoulders and neck were checked but no specific cause or treatment was recommended.  They were concerned about carpal tunnel syndrome and recommended nerve conduction study testing but patient opted not to do this.  She is not sure if she would want to do any surgery for her neck or carpal tunnel syndrome even if these were found.   REVIEW OF SYSTEMS: Full 14 system review of systems performed and negative with exception of: as per HPI.  ALLERGIES: Allergies  Allergen Reactions   Adhesive [Tape] Other (See Comments)    PEELS OFF THE SKIN!!   Clindamycin/Lincomycin Nausea And Vomiting   Codeine Nausea Only   Dilaudid [Hydromorphone Hcl] Nausea And Vomiting   Elemental Sulfur Hives and Other (See  Comments)    Should this be "Sulfa" (??)   Fenofibrate Other (See Comments)    Reaction not recalled   Morphine And Codeine Nausea And Vomiting   Sulfa Antibiotics Hives    HOME MEDICATIONS: Outpatient Medications Prior to Visit  Medication Sig Dispense Refill   albuterol (VENTOLIN HFA) 108 (90 Base) MCG/ACT inhaler Inhale 2 puffs into the lungs 3 - 4 times daily as needed until cough improving, then 4 times daily as needed for wheezing or shortness of breath 6.7 g 2   Cholecalciferol (VITAMIN D3) 1000 units CAPS Take 1,000 Units by mouth daily.      diclofenac Sodium (VOLTAREN) 1 % GEL Apply 2 grams topically to affected joints of both hands 4 (four) times daily. (Patient taking differently: Apply 2 g topically 4 (four) times daily as needed (pain).) 500 g 2   fluticasone (FLONASE) 50 MCG/ACT nasal spray Use 2 sprays in each nostril once daily for sinus/nasal congestion. 16 g 11   gabapentin (NEURONTIN) 300 MG capsule Take 2 capsules (600 mg total) by mouth every evening for nerve pain. 180 capsule 1   Multiple Vitamins-Minerals (PRESERVISION AREDS PO) Take 1 capsule by mouth in the morning and at bedtime.      omeprazole (PRILOSEC) 20 MG capsule Take 1 capsule (20 mg total) by mouth daily for acid reflux. 90 capsule 1   rosuvastatin (CRESTOR) 5 MG tablet Take 1 tablet (5 mg total) by mouth daily. 90 tablet  3   valACYclovir (VALTREX) 1000 MG tablet Take 1,000 mg by mouth 2 (two) times daily as needed (outbreaks).     vitamin E 400 UNIT capsule Take 400 Units by mouth daily.     Facility-Administered Medications Prior to Visit  Medication Dose Route Frequency Provider Last Rate Last Admin   albuterol (PROVENTIL) (2.5 MG/3ML) 0.083% nebulizer solution 2.5 mg  2.5 mg Nebulization Once Salena Saner, MD        PAST MEDICAL HISTORY: Past Medical History:  Diagnosis Date   Anemia    low iron   Chronic renal insufficiency, stage III (moderate) (HCC)    DDD (degenerative disc disease),  lumbar    Diverticulosis    Dizziness    Dyspnea    thinks it's due to the back pain   Factor 5 Leiden mutation, heterozygous (HCC)    GERD (gastroesophageal reflux disease)    Herpes simplex infection    Hiatal hernia    History of anemia    History of atrial fibrillation    HTN (hypertension)    IBS (irritable bowel syndrome)    Mixed hyperlipidemia    Osteoarthritis    Osteopenia    PONV (postoperative nausea and vomiting)    Pre-diabetes    Prediabetes    SOB (shortness of breath) on exertion    Spinal stenosis of lumbar region at multiple levels     PAST SURGICAL HISTORY: Past Surgical History:  Procedure Laterality Date   COLONOSCOPY     DENTAL SURGERY     EYE SURGERY Bilateral    cataract surgery with lens implants   KIDNEY SURGERY Left 01/03/2009   left partial nephrectomy (pathology: leiomyoma)   LUMBAR LAMINECTOMY/DECOMPRESSION MICRODISCECTOMY N/A 09/20/2020   Procedure: LUMBAR LAMINECTOMY/DECOMPRESSION MICRODISCECTOMY LUMBAR TWO TO LUMBAR FIVE;  Surgeon: Coletta Memos, MD;  Location: [facility] OR;  Service: Neurosurgery;  Laterality: N/A;   TOTAL KNEE ARTHROPLASTY Left    TOTAL KNEE ARTHROPLASTY Right     FAMILY HISTORY: Family History  Problem Relation Age of Onset   Heart attack Mother    Diabetes Mother    Heart attack Father    Other Father        aminolysis of kidney   Motor neuron disease Neg Hx     SOCIAL HISTORY: Social History   Socioeconomic History   Marital status: Widowed    Spouse name: Not on file   Number of children: Not on file   Years of education: Not on file   Highest education level: Not on file  Occupational History   Not on file  Tobacco Use   Smoking status: Never   Smokeless tobacco: Never  Vaping Use   Vaping status: Never Used  Substance and Sexual Activity   Alcohol use: No   Drug use: No   Sexual activity: Not on file  Other Topics Concern   Not on file  Social History Narrative   Right handed   Lives alone    Caffeine: 1 cup/day   Social Drivers of Health   Financial Resource Strain: Not on file  Food Insecurity: No Food Insecurity (09/27/2023)   Hunger Vital Sign    Worried About Running Out of Food in the Last Year: Never true    Ran Out of Food in the Last Year: Never true  Transportation Needs: No Transportation Needs (09/27/2023)   PRAPARE - Administrator, Civil Service (Medical): No    Lack of Transportation (Non-Medical): No  Physical Activity:  Not on file  Stress: Not on file  Social Connections: Not on file  Intimate Partner Violence: Not At Risk (09/27/2023)   Humiliation, Afraid, Rape, and Kick questionnaire    Fear of Current or Ex-Partner: No    Emotionally Abused: No    Physically Abused: No    Sexually Abused: No     PHYSICAL EXAM  GENERAL EXAM/CONSTITUTIONAL: Vitals:  Vitals:   02/18/24 1007 02/18/24 1017  BP: (!) 153/70 (!) 150/68  Pulse: 68   Weight: 195 lb (88.5 kg)   Height: 5\' 7"  (1.702 m)    Body mass index is 30.54 kg/m. Wt Readings from Last 3 Encounters:  02/18/24 195 lb (88.5 kg)  02/03/24 195 lb 6.4 oz (88.6 kg)  12/17/23 197 lb 9.6 oz (89.6 kg)   Patient is in no distress; well developed, nourished and groomed; neck is supple  CARDIOVASCULAR: Examination of carotid arteries is normal; no carotid bruits Regular rate and rhythm, no murmurs Examination of peripheral vascular system by observation and palpation is normal  EYES: Ophthalmoscopic exam of optic discs and posterior segments is normal; no papilledema or hemorrhages No results found.  MUSCULOSKELETAL: Gait, strength, tone, movements noted in Neurologic exam below  NEUROLOGIC: MENTAL STATUS:      No data to display         awake, alert, oriented to person, place and time recent and remote memory intact normal attention and concentration language fluent, comprehension intact, naming intact fund of knowledge appropriate  CRANIAL NERVE:  2nd - no papilledema  on fundoscopic exam 2nd, 3rd, 4th, 6th - pupils equal and reactive to light, visual fields full to confrontation, extraocular muscles intact, no nystagmus 5th - facial sensation symmetric 7th - facial strength symmetric 8th - hearing intact 9th - palate elevates symmetrically, uvula midline 11th - shoulder shrug symmetric 12th - tongue protrusion midline  MOTOR:  normal bulk and tone, full strength in the BUE, BLE  SENSORY:  normal and symmetric to light touch, temperature, vibration; EXCEPT DECR PP IN ALL FINGERTIPS  COORDINATION:  finger-nose-finger, fine finger movements normal  REFLEXES:  deep tendon reflexes TRACE and symmetric  GAIT/STATION:  narrow based gait     DIAGNOSTIC DATA (LABS, IMAGING, TESTING) - I reviewed patient records, labs, notes, testing and imaging myself where available.  Lab Results  Component Value Date   WBC 6.6 09/27/2023   HGB 11.7 (L) 09/27/2023   HCT 35.5 (L) 09/27/2023   MCV 90.1 09/27/2023   PLT 207 09/27/2023      Component Value Date/Time   NA 130 (L) 09/27/2023 0534   K 3.7 09/27/2023 0534   CL 95 (L) 09/27/2023 0534   CO2 26 09/27/2023 0534   GLUCOSE 123 (H) 09/27/2023 0534   BUN 12 09/27/2023 0534   CREATININE 0.76 09/27/2023 0534   CALCIUM 9.0 09/27/2023 0534   PROT 6.9 09/26/2023 1826   PROT 6.7 10/04/2020 0731   ALBUMIN 3.3 (L) 09/26/2023 1826   ALBUMIN 3.9 10/04/2020 0731   AST 19 09/26/2023 1826   ALT 15 09/26/2023 1826   ALKPHOS 85 09/26/2023 1826   BILITOT 0.5 09/26/2023 1826   BILITOT 0.3 10/04/2020 0731   GFRNONAA >60 09/27/2023 0534   GFRAA >60 05/24/2020 0316   Lab Results  Component Value Date   CHOL 122 10/04/2020   HDL 47 10/04/2020   LDLCALC 47 10/04/2020   TRIG 168 (H) 10/04/2020   CHOLHDL 2.6 10/04/2020   No results found for: "HGBA1C" No results found for: "  [id number]" Lab Results  Component Value Date   TSH 3.576 09/27/2023    09/06/20 MRI lumbar spine 1. Progressive multilevel  spondylosis of the lumbar spine as described. 2. Severe central canal stenosis at L3-4 is now present secondary to progressive advanced bilateral facet hypertrophy and uncovering of a broad-based disc protrusion with progressive anterolisthesis. 3. Severe right and moderate left foraminal stenosis at L3-4 has progressed. 4. Moderate right subarticular and foraminal stenosis at L4-5 has progressed. 5. Mild foraminal narrowing bilaterally at L1-2 is worse on the left. 6. Moderate foraminal narrowing bilaterally at L2-3 has progressed, left greater than right. 7. Mild facet hypertrophy at L5-S1 without significant stenosis.   ASSESSMENT AND PLAN  89 y.o. year old female here with numbness in hands and feet since ~2021; worse in last few months.  Dx:  1. Neuropathy     PLAN:  INTERMITTENT NUMBNESS IN FINGERTIPS / TOES (since ~2021; worse in last few months) - check neuropathy labs (eval for other systemic causes of neuropathy) - will request MRI cervical spine results (however, patient not likely surgical candidate) - avoid compression at elbows / wrists; if patient wants to pursue carpal tunnel syndrome surgery, then follow up with orthopedic clinic for testing and treatment (although symptoms are more widespread than localized compression neuropathies) - WEAR WRIST SPLINT AT BEDTIME   Orders Placed This Encounter  Procedures   SPEP with IFE   ANA w/Reflex   SSA, SSB   Ambulatory referral to Occupational Therapy   Return for pending test results, pending if symptoms worsen or fail to improve.    Suanne Marker, MD 02/18/2024, 10:41 AM Certified in Neurology, Neurophysiology and Neuroimaging  [facility] [address] [place], [place] [address] [phone]

## 2024-02-18 NOTE — Patient Instructions (Signed)
  INTERMITTENT NUMBNESS IN FINGERTIPS / TOES (since ~2021; worse in last few months) - check neuropathy labs (eval for other systemic causes of neuropathy) - follow up MRI cervical spine results (however, patient not likely surgical candidate) - avoid compression at elbows / wrists; if patient wants to pursue carpal tunnel syndrome surgery, then follow up with orthopedic clinic for testing and treatment (although symptoms are more widespread than localized compression neuropathies) - WEAR WRIST SPLINT AT BEDTIME

## 2024-02-22 ENCOUNTER — Ambulatory Visit: Admitting: Podiatry

## 2024-02-22 ENCOUNTER — Ambulatory Visit (INDEPENDENT_AMBULATORY_CARE_PROVIDER_SITE_OTHER)

## 2024-02-22 ENCOUNTER — Other Ambulatory Visit: Payer: Self-pay | Admitting: Podiatry

## 2024-02-22 ENCOUNTER — Encounter: Payer: Self-pay | Admitting: Podiatry

## 2024-02-22 DIAGNOSIS — S92354A Nondisplaced fracture of fifth metatarsal bone, right foot, initial encounter for closed fracture: Secondary | ICD-10-CM

## 2024-02-22 DIAGNOSIS — M7750 Other enthesopathy of unspecified foot: Secondary | ICD-10-CM | POA: Diagnosis not present

## 2024-02-22 NOTE — Progress Notes (Signed)
 She presents today with her daughter with a chief concern of pain to the lateral aspect of the right foot.  States that it started aching and swelling with some mild redness just 4 days ago.  Denies any trauma that she can recall.  Hurts to walk on it.  Walks with a walker at home.  Objective: Vital signs are stable alert oriented x 3.  Pulses are palpable.  She has pitting edema overlying the base of the fifth metatarsal and pain with resistance on abduction and plantarflexion and eversion.  Radiographs taken today demonstrate what appears to be a history of small tears at the insertion of the peroneus brevis tendon and at the base of the fifth metatarsal.  There does appear to be a small fracture nondisplaced transversely on 1 view of the radiograph.  Assessment: Possible fracture fifth met with peroneal tendinitis.  Plan: Placed her in a short cam boot and I will follow-up with her in 1 month

## 2024-02-28 LAB — SJOGREN'S SYNDROME ANTIBODS(SSA + SSB)
ENA SSA (RO) Ab: <0.2 AI (ref 0.0–0.9)
ENA SSB (LA) Ab: <0.2 AI (ref 0.0–0.9)

## 2024-02-28 LAB — MULTIPLE MYELOMA PANEL, SERUM
Albumin SerPl Elph-Mcnc: 3.1 g/dL (ref 2.9–4.4)
Albumin/Glob SerPl: 1 (ref 0.7–1.7)
Alpha 1: 0.2 g/dL (ref 0.0–0.4)
Alpha2 Glob SerPl Elph-Mcnc: 0.7 g/dL (ref 0.4–1.0)
B-Globulin SerPl Elph-Mcnc: 1 g/dL (ref 0.7–1.3)
Gamma Glob SerPl Elph-Mcnc: 1.3 g/dL (ref 0.4–1.8)
Globulin, Total: 3.3 g/dL (ref 2.2–3.9)
IgA/Immunoglobulin A, Serum: 510 mg/dL — ABNORMAL HIGH (ref 64–422)
IgG (Immunoglobin G), Serum: 958 mg/dL (ref 586–1602)
IgM (Immunoglobulin M), Srm: 288 mg/dL — ABNORMAL HIGH (ref 26–217)
M Protein SerPl Elph-Mcnc: 0.4 g/dL — ABNORMAL HIGH
Total Protein: 6.4 g/dL (ref 6.0–8.5)

## 2024-02-28 LAB — ANA W/REFLEX: ANA Titer 1: NEGATIVE

## 2024-02-29 ENCOUNTER — Telehealth: Payer: Self-pay

## 2024-02-29 NOTE — Telephone Encounter (Signed)
 Multilevel degenerative changes noted at C4-5, C5-6 and C6-7 with mild spinal stenosis.  Foraminal stenosis noted at C4-5 and C5-6.  These findings could contribute to numbness in the hands.  Follow-up with orthopedic clinic if she wants to pursue surgery or epidural steroid injections.  Suanne Marker, MD 02/29/2024, 2:56 PM Certified in Neurology, Neurophysiology and Neuroimaging  [facility] [address] [place], [place] [address] [phone]

## 2024-02-29 NOTE — Telephone Encounter (Signed)
 MRI report received from [facility], placed on MD desk for review.

## 2024-03-01 DIAGNOSIS — E782 Mixed hyperlipidemia: Secondary | ICD-10-CM | POA: Diagnosis not present

## 2024-03-01 DIAGNOSIS — E1121 Type 2 diabetes mellitus with diabetic nephropathy: Secondary | ICD-10-CM | POA: Diagnosis not present

## 2024-03-01 DIAGNOSIS — Z79899 Other long term (current) drug therapy: Secondary | ICD-10-CM | POA: Diagnosis not present

## 2024-03-03 ENCOUNTER — Encounter: Payer: Self-pay | Admitting: Diagnostic Neuroimaging

## 2024-03-06 ENCOUNTER — Other Ambulatory Visit: Payer: Self-pay

## 2024-03-06 ENCOUNTER — Other Ambulatory Visit (HOSPITAL_COMMUNITY): Payer: Self-pay

## 2024-03-06 DIAGNOSIS — I10 Essential (primary) hypertension: Secondary | ICD-10-CM | POA: Diagnosis not present

## 2024-03-06 DIAGNOSIS — Z1331 Encounter for screening for depression: Secondary | ICD-10-CM | POA: Diagnosis not present

## 2024-03-06 DIAGNOSIS — Z139 Encounter for screening, unspecified: Secondary | ICD-10-CM | POA: Diagnosis not present

## 2024-03-06 DIAGNOSIS — E782 Mixed hyperlipidemia: Secondary | ICD-10-CM | POA: Diagnosis not present

## 2024-03-06 DIAGNOSIS — E1121 Type 2 diabetes mellitus with diabetic nephropathy: Secondary | ICD-10-CM | POA: Diagnosis not present

## 2024-03-06 DIAGNOSIS — K219 Gastro-esophageal reflux disease without esophagitis: Secondary | ICD-10-CM | POA: Diagnosis not present

## 2024-03-06 DIAGNOSIS — M19042 Primary osteoarthritis, left hand: Secondary | ICD-10-CM | POA: Diagnosis not present

## 2024-03-06 DIAGNOSIS — D6851 Activated protein C resistance: Secondary | ICD-10-CM | POA: Diagnosis not present

## 2024-03-06 DIAGNOSIS — M19041 Primary osteoarthritis, right hand: Secondary | ICD-10-CM | POA: Diagnosis not present

## 2024-03-06 DIAGNOSIS — N182 Chronic kidney disease, stage 2 (mild): Secondary | ICD-10-CM | POA: Diagnosis not present

## 2024-03-06 DIAGNOSIS — M48061 Spinal stenosis, lumbar region without neurogenic claudication: Secondary | ICD-10-CM | POA: Diagnosis not present

## 2024-03-06 MED ORDER — GABAPENTIN 300 MG PO CAPS
600.0000 mg | ORAL_CAPSULE | Freq: Every evening | ORAL | 3 refills | Status: AC
  Filled 2024-03-06 – 2024-06-01 (×6): qty 180, 90d supply, fill #0
  Filled 2024-07-10 – 2024-09-03 (×2): qty 180, 90d supply, fill #1
  Filled 2024-11-20: qty 180, 90d supply, fill #2

## 2024-03-06 MED ORDER — OMEPRAZOLE 20 MG PO CPDR
20.0000 mg | DELAYED_RELEASE_CAPSULE | Freq: Every day | ORAL | 3 refills | Status: AC
  Filled 2024-03-07 – 2024-05-27 (×4): qty 90, 90d supply, fill #0
  Filled 2024-07-04 – 2024-09-03 (×2): qty 90, 90d supply, fill #1
  Filled 2024-11-20: qty 90, 90d supply, fill #2

## 2024-03-06 MED ORDER — FLUTICASONE PROPIONATE 50 MCG/ACT NA SUSP
2.0000 | Freq: Every day | NASAL | 1 refills | Status: DC
  Filled 2024-03-06: qty 16, 30d supply, fill #0
  Filled 2024-05-27 – 2024-05-30 (×2): qty 16, 30d supply, fill #1

## 2024-03-06 MED ORDER — OMEPRAZOLE 20 MG PO CPDR
20.0000 mg | DELAYED_RELEASE_CAPSULE | Freq: Every day | ORAL | 0 refills | Status: DC
  Filled 2024-03-06: qty 90, 90d supply, fill #0

## 2024-03-06 MED ORDER — GABAPENTIN 300 MG PO CAPS
600.0000 mg | ORAL_CAPSULE | Freq: Every day | ORAL | 0 refills | Status: DC
  Filled 2024-03-06: qty 180, 90d supply, fill #0

## 2024-03-07 ENCOUNTER — Other Ambulatory Visit (HOSPITAL_COMMUNITY): Payer: Self-pay

## 2024-03-16 ENCOUNTER — Other Ambulatory Visit: Payer: Self-pay

## 2024-03-16 ENCOUNTER — Ambulatory Visit: Attending: Diagnostic Neuroimaging | Admitting: Occupational Therapy

## 2024-03-16 ENCOUNTER — Encounter: Payer: Self-pay | Admitting: Occupational Therapy

## 2024-03-16 DIAGNOSIS — R208 Other disturbances of skin sensation: Secondary | ICD-10-CM | POA: Diagnosis not present

## 2024-03-16 DIAGNOSIS — G629 Polyneuropathy, unspecified: Secondary | ICD-10-CM | POA: Diagnosis not present

## 2024-03-16 DIAGNOSIS — M6281 Muscle weakness (generalized): Secondary | ICD-10-CM | POA: Diagnosis not present

## 2024-03-16 DIAGNOSIS — M25512 Pain in left shoulder: Secondary | ICD-10-CM | POA: Insufficient documentation

## 2024-03-16 DIAGNOSIS — R278 Other lack of coordination: Secondary | ICD-10-CM | POA: Insufficient documentation

## 2024-03-16 DIAGNOSIS — R29898 Other symptoms and signs involving the musculoskeletal system: Secondary | ICD-10-CM | POA: Diagnosis not present

## 2024-03-16 DIAGNOSIS — R29818 Other symptoms and signs involving the nervous system: Secondary | ICD-10-CM | POA: Insufficient documentation

## 2024-03-16 DIAGNOSIS — M79641 Pain in right hand: Secondary | ICD-10-CM | POA: Diagnosis not present

## 2024-03-16 NOTE — Therapy (Signed)
 OUTPATIENT OCCUPATIONAL THERAPY NEURO EVALUATION  Patient Name: [name] MRN: [id number] DOB:1931/03/04, 89 y.o., female Today's Date: 03/16/2024  PCP: Aloha Arnold, PA-C REFERRING PROVIDER: Omega Bible, MD  END OF SESSION:  OT End of Session - 03/16/24 3016     Visit Number 1    Number of Visits 12    Authorization Type [employer] 2025 $15 copay    Authorization Time Period Met VL: [place] Auth Not Reqd    Progress Note Due on Visit 10    OT Start Time 0845    OT Stop Time 0935    OT Time Calculation (min) 50 min    Equipment Utilized During Treatment Testing Material    Activity Tolerance Patient tolerated treatment well    Behavior During Therapy WFL for tasks assessed/performed             Past Medical History:  Diagnosis Date   Anemia    low iron   Chronic renal insufficiency, stage III (moderate) (HCC)    DDD (degenerative disc disease), lumbar    Diverticulosis    Dizziness    Dyspnea    thinks it's due to the back pain   Factor 5 Leiden mutation, heterozygous (HCC)    GERD (gastroesophageal reflux disease)    Herpes simplex infection    Hiatal hernia    History of anemia    History of atrial fibrillation    HTN (hypertension)    IBS (irritable bowel syndrome)    Mixed hyperlipidemia    Osteoarthritis    Osteopenia    PONV (postoperative nausea and vomiting)    Pre-diabetes    Prediabetes    SOB (shortness of breath) on exertion    Spinal stenosis of lumbar region at multiple levels    Past Surgical History:  Procedure Laterality Date   COLONOSCOPY     DENTAL SURGERY     EYE SURGERY Bilateral    cataract surgery with lens implants   KIDNEY SURGERY Left 01/03/2009   left partial nephrectomy (pathology: leiomyoma)   LUMBAR LAMINECTOMY/DECOMPRESSION MICRODISCECTOMY N/A 09/20/2020   Procedure: LUMBAR LAMINECTOMY/DECOMPRESSION MICRODISCECTOMY LUMBAR TWO TO LUMBAR FIVE;  Surgeon: Audie Bleacher, MD;  Location: [facility] OR;  Service:  Neurosurgery;  Laterality: N/A;   TOTAL KNEE ARTHROPLASTY Left    TOTAL KNEE ARTHROPLASTY Right    Patient Active Problem List   Diagnosis Date Noted   Community acquired pneumonia 09/27/2023   Pneumonia 09/27/2023   Family history of factor V Leiden mutation 10/13/2021   Lumbar stenosis with neurogenic claudication 09/20/2020   Chest pain 05/24/2020   Pulmonary nodule 05/24/2020   HLD (hyperlipidemia) 05/24/2020   Chest pain, atypical 02/23/2017   Disequilibrium 02/23/2017   Essential hypertension 02/23/2017   Dizziness 02/12/2017   Chronic headaches 02/12/2017   Lightheadedness 02/12/2017   Chest tightness 02/12/2017   Shoulder pain 02/12/2017   Shortness of breath 02/12/2017    ONSET DATE: 02/18/2024  REFERRING DIAG: G62.9 (ICD-10-CM) - Neuropathy Eval and treat bilateral hand numbness  THERAPY DIAG:  Pain in right hand  Other disturbances of skin sensation  Muscle weakness (generalized)  Other lack of coordination  Left shoulder pain, unspecified chronicity  Other symptoms and signs involving the musculoskeletal system  Other symptoms and signs involving the nervous system  Rationale for Evaluation and Treatment: Rehabilitation  SUBJECTIVE:   SUBJECTIVE STATEMENT: Pt reports numbness in hands for several years ie) her hands would go numb with driving, sewing, sleeping etc and is mostly in her  finger tips.  She also has some pain in R hand/thumb more than likely due to arthritis.  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  Pt accompanied by: family member - daughters Aram Beecham Artist - primary caregiver/transportation) and Okey Regal (visiting from [place])  PERTINENT HISTORY:   PMHx: HTN (no longer on BP med per pt), headaches, L shoulder pain, lumbar stenosis,   MD note/PLAN 02/18/24:  INTERMITTENT NUMBNESS IN FINGERTIPS / TOES (since ~2021; worse in last few months) - check neuropathy labs (eval for other systemic causes of  neuropathy) - will request MRI cervical spine results (however, patient not likely surgical candidate) - avoid compression at elbows / wrists; if patient wants to pursue carpal tunnel syndrome surgery, then follow up with orthopedic clinic for testing and treatment (although symptoms are more widespread than localized compression neuropathies) - WEAR WRIST SPLINT AT BEDTIME  -Return for pending test results, pending if symptoms worsen or fail to improve.  02/29/24 - MRI report from [facility] - Multilevel degenerative changes noted at C4-5, C5-6 and C6-7 with mild spinal stenosis(narrowing). These findings could contribute to numbness in the hands. Dr. Marjory Lies recommends she f/u with orthopedic clinic if you would like to pursue surgery or epidural steroid injections.   02/22/24 - Radiographs taken this day demonstrate what appears to be a history of small tears at the insertion of the peroneus brevis tendon and at the base of the fifth metatarsal.  There does appear to be a small fracture nondisplaced transversely on 1 view of the radiograph.  Plan: Placed her in a short cam boot and I will follow-up with her in 1 month  PRECAUTIONS: Fall and Other: Cam boot RLE  WEIGHT BEARING RESTRICTIONS: Yes WBAT in CAM boot at this time  PAIN: N/A upon arrival  Are you having pain? At worst Yes: NPRS scale: 8-9 Pain location: hand joints, base of R thumb Pain description: deep aching Aggravating factors: writing, sewing Relieving factors: stop using hands, Tylenol, Volatren on hands  FALLS: Has patient fallen in last 6 months? Yes. Number of falls 1 but > 6 months ago  LIVING ENVIRONMENT: Lives with: lives alone, daughter Arline Asp lives behind her and is in/out of the house, she is present for AM/PM ADLS to make sure she doesn't fall, may help her get clothes over head, off arm etc Lives in: House Stairs: No Has following equipment at home: Single point cane, Environmental consultant - 2 wheeled, Wheelchair (manual),  Shower bench, Grab bars, and walk-in shower and widened doors  PLOF: Independent with household mobility with device, Needs assistance with ADLs, and Needs assistance with homemaking   PATIENT GOALS: decreased numbness and pain in hands so she can do her daily activities  OBJECTIVE:  Note: Objective measures were completed at Evaluation unless otherwise noted.  HAND DOMINANCE: Right  ADLs: Overall ADLs: Mod Ind to [employer] Transfers/ambulation related to ADLs: Supervision in/out of the bed  Eating: Some help if things are hard to cut Grooming: Help as needed based on pain/swelling and numbness UB Dressing: Often help to pull bra down in the back - using pull over bra LB Dressing: Some trouble with R foot - ie) help with sock/shoe Toileting: Ind Bathing: Showers herself Tub Shower transfers: Mod Ind to supervision (esp with CAM boot at this time) Equipment: Grab bars, Walk in shower, and built in seat  IADLs:   Shopping: Assistance from daughter Light housekeeping: Some self performance/some family assist Meal Prep: Some self performance/some  family assist Community mobility: Supervision with UP walker Medication management: TBA Financial management: TBA Handwriting:  Not assessed but 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  She does like a pen with gripper.  MOBILITY STATUS: Needs Assist: UP walker and Hx of falls - family reports walking 1 mile a couple of times/week  POSTURE COMMENTS:  rounded shoulders and forward head Sitting balance: WFL  ACTIVITY TOLERANCE: Activity tolerance: Good  FUNCTIONAL OUTCOME MEASURES: Quick Dash: 54.5  UPPER EXTREMITY ROM:    Active ROM Right eval Left eval  Shoulder flexion 145 H/o of sh lim ~ 120  Shoulder abduction limited limited  Shoulder adduction    Shoulder extension    Shoulder internal rotation    Shoulder external rotation    Elbow flexion [facility]  WFL  Elbow extension    Wrist flexion limited limited  Wrist extension    Wrist ulnar deviation    Wrist radial deviation    Wrist pronation    Wrist supination    (Blank rows = not tested)  UPPER EXTREMITY MMT:     MMT Right eval Left eval  Shoulder flexion 3- 3-  Shoulder abduction    Shoulder adduction    Shoulder extension    Shoulder internal rotation    Shoulder external rotation    Middle trapezius    Lower trapezius    Elbow flexion 3 3  Elbow extension 3 3  Wrist flexion    Wrist extension    Wrist ulnar deviation    Wrist radial deviation    Wrist pronation    Wrist supination    (Blank rows = not tested)  HAND FUNCTION: Grip strength: Right: 46.5, 48.5 lbs; Left: 42.3, 42.5 lbs Hurts in the right thumb Average: Right: 47.5 lbs; Left 42.4 lbs  COORDINATION: 9 Hole Peg test: Right: 25.67 sec; Left: 24.51 sec  SENSATION: Numbness  INTERMITTENT NUMBNESS IN FINGERTIPS / TOES (since ~2021; worse in last few months)  EDEMA: hand joints swell  MUSCLE TONE: WFL  COGNITION: Overall cognitive status: Within functional limits for tasks assessed  VISION: Subjective report: Likes things printed larger; does use magnify glass at times Baseline vision: Bifocals Visual history:  TBA  OBSERVATIONS: Pt ambulates with UP walker and no loss of balance. The pt is well kept and has CAM boot donned on RLE.                                                                                                                            TREATMENT DATE: 03/16/24   Therapeutic Exercises: Pt issued tendon gliding exercises/handout with review of motions to isolate DIP, PIP and MCP joints for straight finger position, hook (DIP/PIP flexion), fist (DIP/PIP/MCP flexion), taco/duck (MCP flexion only) and flat fist (MCP and PIP flexion). ROM introduced to help reduce swelling and increase motions within the fingers ie) primary goal of  these exercises is to keep the tendons moving freely  within the wrist, hand and carpal tunnel, to increase motion - not intended to build hand or wrist strength at this time.   Patient benefited from extra time, verbal/tactile cues, and modeling of task to allow time for processing of verbal instructions and improve motor planning of unfamiliar movements.   Self Care: OT initiated education pt on joint protection ideas to improve UE pain. Handout NOT provided yet. - Respecting for Pain and stopping activities before they reach the point of discomfort or pain  - Rest and Work Balance ie) balancing activities with appropriate rests during activity  OT initiated education on sleep positioning ie) stretching arms out in side lying to avoid hands tucked up at her face, to reduce stress to upper extremity nerves, which could be attributing to reported paresthesias and pain in UEs. Patient verbalized understanding. Handout NOT provided yet.  PATIENT EDUCATION: Education details: OT role, POC considerations, Tendon Glides. Also initiated jt protection/sleep position education Person educated: Patient and Child(ren) Education method: Explanation, Demonstration, Tactile cues, Verbal cues, and Handouts Education comprehension: verbalized understanding, returned demonstration, verbal cues required, tactile cues required, and needs further education  HOME EXERCISE PROGRAM: 03/16/24: Tendon gliding Exercises   GOALS: Goals reviewed with patient? Yes    SHORT TERM GOALS: Target date: 04/07/14   Patient will demonstrate initial BUE HEP with 25% verbal cues or less for proper execution. Baseline: New to outpt OT Goal status: IN Progress - Tendon Gliding Exc issued at eval.   2.  Pt to trial prefab hand splints to help improve numbness and improve overall comfort for functional use of UEs, nighttime support and pain management .  Baseline: Pt has splint at home but hasn't found it to be comfortable - encouraged to bring it to next appt Goal status: IN  Progress   3.  Pt will independently recall at least 3 joint protection, ergonomics, and body mechanic principles as noted in pt instructions to assist with daily tasks with increased comfort and confidence.  Baseline:  New to outpt OT Goal status: INITIAL   4.  Patient will demonstrate no loss of grip strength and/or improvement as needed to minimize dropping objects and manage containers etc. Baseline: Right: 47.5 lbs; Left 42.4 lbs Goal status: INITIAL   5.  Pt will independently recall sleep positioning options as noted in pt instructions to improve sleep disturbances from moderate to minimal.  Baseline:  mod difficulty per QuickDash Goal status: INITIAL     LONG TERM GOALS: Target date: 04/28/24   Patient will demonstrate updated BUE HEP with visual instruction only for proper execution. Baseline: New to outpt OT Goal status: IN Progress - Tendon Gliding Exc issued at eval.   2.  Pt will independently recall at least 3 options for adaptive equipment to assist with joint protection, ergonomics, and body mechanic principles as noted in pt instructions for ADLs and IADLS.  Baseline:  New to outpt OT Goal status: INITIAL   3.  Pt will be independent with home based pain management routine to potentially include gloves/splints, heat and joint protection principles for minimal pain. Baseline: 8-9/10 Goal status: INITIAL   4.  Patient will demonstrate < 50% disability or less, indicating improved functional use of affected extremity.  Baseline: QuickDash 54.5% Goal status: INITIAL   ASSESSMENT:  CLINICAL IMPRESSION: Patient is a 89 y.o. female who was seen today for occupational therapy evaluation for neuropathy of BUEs. Hx includes HTN (no  longer on BP med per pt), headaches, L shoulder pain, lumbar stenosis and probable arthritis per patient/daughter. Patient currently presents with frequent numbness and tingling with functional activities. Although numbness is not constant  therefore, expect OT jt protection, ROM and splinting activities to be helpful. Pt would benefit from skilled OT services in the outpatient setting to work on impairments as noted below to help pt return to PLOF as able.    PERFORMANCE DEFICITS: in functional skills including ADLs, IADLs, coordination, dexterity, sensation, edema, ROM, strength, pain, flexibility, Fine motor control, Gross motor control, mobility, balance, body mechanics, endurance, decreased knowledge of precautions, decreased knowledge of use of DME, and UE functional use, cognitive skills including problem solving, safety awareness, and understand, and psychosocial skills including coping strategies, environmental adaptation, and routines and behaviors.   IMPAIRMENTS: are limiting patient from ADLs, IADLs, rest and sleep, and leisure.   CO-MORBIDITIES: may have co-morbidities  that affects occupational performance. Patient will benefit from skilled OT to address above impairments and improve overall function.  MODIFICATION OR ASSISTANCE TO COMPLETE EVALUATION: Min-Moderate modification of tasks or assist with assess necessary to complete an evaluation.  OT OCCUPATIONAL PROFILE AND HISTORY: Detailed assessment: Review of records and additional review of physical, cognitive, psychosocial history related to current functional performance.  CLINICAL DECISION MAKING: Moderate - several treatment options, min-mod task modification necessary  REHAB POTENTIAL: Good  EVALUATION COMPLEXITY: Moderate    PLAN:  OT FREQUENCY: 1-2x/week  OT DURATION: up to 6 weeks with 4 weeks scheduled at this time  PLANNED INTERVENTIONS: 97535 self care/ADL training, 65784 therapeutic exercise, 97530 therapeutic activity, 97112 neuromuscular re-education, 97140 manual therapy, 97035 ultrasound, 97018 paraffin, 69629 fluidotherapy, 97010 moist heat, balance training, visual/perceptual remediation/compensation, energy conservation, coping strategies  training, patient/family education, and DME and/or AE instructions  RECOMMENDED OTHER SERVICES: NA at this time  CONSULTED AND AGREED WITH PLAN OF CARE: Patient and family member/caregiver  PLAN FOR NEXT SESSION:  Review tendon gliding exercises Modalities - fluido/US /paraffin Sleep positioning handouts .sleeppositionsupated Joint protection  Progress HEPs as able Sensory education    Zora Hires, OT 03/16/2024, 5:39 PM

## 2024-03-21 ENCOUNTER — Ambulatory Visit: Admitting: Occupational Therapy

## 2024-03-21 DIAGNOSIS — M6281 Muscle weakness (generalized): Secondary | ICD-10-CM

## 2024-03-21 DIAGNOSIS — R29818 Other symptoms and signs involving the nervous system: Secondary | ICD-10-CM

## 2024-03-21 DIAGNOSIS — R208 Other disturbances of skin sensation: Secondary | ICD-10-CM

## 2024-03-21 DIAGNOSIS — M79641 Pain in right hand: Secondary | ICD-10-CM | POA: Diagnosis not present

## 2024-03-21 DIAGNOSIS — R29898 Other symptoms and signs involving the musculoskeletal system: Secondary | ICD-10-CM

## 2024-03-21 DIAGNOSIS — R278 Other lack of coordination: Secondary | ICD-10-CM

## 2024-03-21 DIAGNOSIS — M25512 Pain in left shoulder: Secondary | ICD-10-CM

## 2024-03-21 NOTE — Therapy (Signed)
 OUTPATIENT OCCUPATIONAL THERAPY NEURO Treatment  Patient Name: NAKYIA DAU MRN: [id number] DOB:1931-05-29, 89 y.o., female Today's Date: 03/21/2024  PCP: Aloha Arnold, PA-C REFERRING PROVIDER: Omega Bible, MD  END OF SESSION:  OT End of Session - 03/21/24 1248     Visit Number 2    Number of Visits 12    Authorization Type [employer] 2025 $15 copay    Authorization Time Period Met VL: [place] Auth Not Reqd    Progress Note Due on Visit 10    OT Start Time 1150    OT Stop Time 1230    OT Time Calculation (min) 40 min    Activity Tolerance Patient tolerated treatment well    Behavior During Therapy WFL for tasks assessed/performed              Past Medical History:  Diagnosis Date   Anemia    low iron   Chronic renal insufficiency, stage III (moderate) (HCC)    DDD (degenerative disc disease), lumbar    Diverticulosis    Dizziness    Dyspnea    thinks it's due to the back pain   Factor 5 Leiden mutation, heterozygous (HCC)    GERD (gastroesophageal reflux disease)    Herpes simplex infection    Hiatal hernia    History of anemia    History of atrial fibrillation    HTN (hypertension)    IBS (irritable bowel syndrome)    Mixed hyperlipidemia    Osteoarthritis    Osteopenia    PONV (postoperative nausea and vomiting)    Pre-diabetes    Prediabetes    SOB (shortness of breath) on exertion    Spinal stenosis of lumbar region at multiple levels    Past Surgical History:  Procedure Laterality Date   COLONOSCOPY     DENTAL SURGERY     EYE SURGERY Bilateral    cataract surgery with lens implants   KIDNEY SURGERY Left 01/03/2009   left partial nephrectomy (pathology: leiomyoma)   LUMBAR LAMINECTOMY/DECOMPRESSION MICRODISCECTOMY N/A 09/20/2020   Procedure: LUMBAR LAMINECTOMY/DECOMPRESSION MICRODISCECTOMY LUMBAR TWO TO LUMBAR FIVE;  Surgeon: Audie Bleacher, MD;  Location: [facility] OR;  Service: Neurosurgery;  Laterality: N/A;   TOTAL KNEE ARTHROPLASTY Left     TOTAL KNEE ARTHROPLASTY Right    Patient Active Problem List   Diagnosis Date Noted   Community acquired pneumonia 09/27/2023   Pneumonia 09/27/2023   Family history of factor V Leiden mutation 10/13/2021   Lumbar stenosis with neurogenic claudication 09/20/2020   Chest pain 05/24/2020   Pulmonary nodule 05/24/2020   HLD (hyperlipidemia) 05/24/2020   Chest pain, atypical 02/23/2017   Disequilibrium 02/23/2017   Essential hypertension 02/23/2017   Dizziness 02/12/2017   Chronic headaches 02/12/2017   Lightheadedness 02/12/2017   Chest tightness 02/12/2017   Shoulder pain 02/12/2017   Shortness of breath 02/12/2017    ONSET DATE: 02/18/2024  REFERRING DIAG: G62.9 (ICD-10-CM) - Neuropathy Eval and treat bilateral hand numbness  THERAPY DIAG:  Pain in right hand  Other disturbances of skin sensation  Muscle weakness (generalized)  Other lack of coordination  Left shoulder pain, unspecified chronicity  Other symptoms and signs involving the musculoskeletal system  Other symptoms and signs involving the nervous system  Rationale for Evaluation and Treatment: Rehabilitation  SUBJECTIVE:   SUBJECTIVE STATEMENT: Pt reported no acute changes or updates.   Pt accompanied by: family member - Data processing manager (visiting from [place] )  PERTINENT HISTORY:   PMHx: HTN (no longer on BP med per  pt), headaches, L shoulder pain, lumbar stenosis,   MD note/PLAN 02/18/24:  INTERMITTENT NUMBNESS IN FINGERTIPS / TOES (since ~2021; worse in last few months) - check neuropathy labs (eval for other systemic causes of neuropathy) - will request MRI cervical spine results (however, patient not likely surgical candidate) - avoid compression at elbows / wrists; if patient wants to pursue carpal tunnel syndrome surgery, then follow up with orthopedic clinic for testing and treatment (although symptoms are more widespread than localized compression neuropathies) - WEAR WRIST SPLINT AT BEDTIME   -Return for pending test results, pending if symptoms worsen or fail to improve.  02/29/24 - MRI report from [facility] - Multilevel degenerative changes noted at C4-5, C5-6 and C6-7 with mild spinal stenosis(narrowing). These findings could contribute to numbness in the hands. Dr. Salli Crawley recommends she f/u with orthopedic clinic if you would like to pursue surgery or epidural steroid injections.   02/22/24 - Radiographs taken this day demonstrate what appears to be a history of small tears at the insertion of the peroneus brevis tendon and at the base of the fifth metatarsal.  There does appear to be a small fracture nondisplaced transversely on 1 view of the radiograph.  Plan: Placed her in a short cam boot and I will follow-up with her in 1 month  PRECAUTIONS: Fall and Other: Cam boot RLE  WEIGHT BEARING RESTRICTIONS: Yes WBAT in CAM boot at this time  PAIN: N/A upon arrival  Are you having pain? At worst Yes: NPRS scale: 6/10 Pain location: hand joints, base of R thumb Pain description: deep aching Aggravating factors: writing, sewing Relieving factors: stop using hands, Tylenol , Volatren on hands  FALLS: Has patient fallen in last 6 months? Yes. Number of falls 1 but > 6 months ago  LIVING ENVIRONMENT: Lives with: lives alone, daughter [name] lives behind her and is in/out of the house, she is present for AM/PM ADLS to make sure she doesn't fall, may help her get clothes over head, off arm etc Lives in: House Stairs: No Has following equipment at home: Single point cane, Environmental consultant - 2 wheeled, Wheelchair (manual), Shower bench, Grab bars, and walk-in shower and widened doors  PLOF: Independent with household mobility with device, Needs assistance with ADLs, and Needs assistance with homemaking   PATIENT GOALS: decreased numbness and pain in hands so she can do her daily activities  OBJECTIVE:  Note: Objective measures were completed at Evaluation unless otherwise noted.  HAND  DOMINANCE: Right  ADLs: Overall ADLs: Mod Ind to [employer] Transfers/ambulation related to ADLs: Supervision in/out of the bed  Eating: Some help if things are hard to cut Grooming: Help as needed based on pain/swelling and numbness UB Dressing: Often help to pull bra down in the back - using pull over bra LB Dressing: Some trouble with R foot - ie) help with sock/shoe Toileting: Ind Bathing: Showers herself Tub Shower transfers: Mod Ind to supervision (esp with CAM boot at this time) Equipment: Grab bars, Walk in shower, and built in seat  IADLs:   Shopping: Assistance from daughter Light housekeeping: Some self performance/some family assist Meal Prep: Some self performance/some family assist Community mobility: Supervision with UP walker Medication management: TBA Financial management: TBA Handwriting:  Not assessed but 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  She does like a pen with gripper.  MOBILITY STATUS: Needs Assist: UP walker and Hx of  falls - family reports walking 1 mile a couple of times/week  POSTURE COMMENTS:  rounded shoulders and forward head Sitting balance: WFL  ACTIVITY TOLERANCE: Activity tolerance: Good  FUNCTIONAL OUTCOME MEASURES: Quick Dash: 54.5  UPPER EXTREMITY ROM:    Active ROM Right eval Left eval  Shoulder flexion 145 H/o of sh lim ~ 120  Shoulder abduction limited limited  Shoulder adduction    Shoulder extension    Shoulder internal rotation    Shoulder external rotation    Elbow flexion [facility] WFL  Elbow extension    Wrist flexion limited limited  Wrist extension    Wrist ulnar deviation    Wrist radial deviation    Wrist pronation    Wrist supination    (Blank rows = not tested)  UPPER EXTREMITY MMT:     MMT Right eval Left eval  Shoulder flexion 3- 3-  Shoulder abduction    Shoulder adduction    Shoulder extension    Shoulder internal rotation     Shoulder external rotation    Middle trapezius    Lower trapezius    Elbow flexion 3 3  Elbow extension 3 3  Wrist flexion    Wrist extension    Wrist ulnar deviation    Wrist radial deviation    Wrist pronation    Wrist supination    (Blank rows = not tested)  HAND FUNCTION: Grip strength: Right: 46.5, 48.5 lbs; Left: 42.3, 42.5 lbs Hurts in the right thumb Average: Right: 47.5 lbs; Left 42.4 lbs  COORDINATION: 9 Hole Peg test: Right: 25.67 sec; Left: 24.51 sec  SENSATION: Numbness  INTERMITTENT NUMBNESS IN FINGERTIPS / TOES (since ~2021; worse in last few months)  EDEMA: hand joints swell  MUSCLE TONE: WFL  COGNITION: Overall cognitive status: Within functional limits for tasks assessed  VISION: Subjective report: Likes things printed larger; does use magnify glass at times Baseline vision: Bifocals Visual history:  TBA  OBSERVATIONS: Pt ambulates with UP walker and no loss of balance. The pt is well kept and has CAM boot donned on RLE.                                                                                                                            TREATMENT DATE:  [facility]Ex OT reviewed HEP Tendon glides - to improve carryover and BUE ROM, to decrease BUE stiffness. P returned demo.   Self-Care OT educated pt and family on sleep positioning. Handout provided, see pt instructions. Pt and family acknowledged understanding.  OT educated pt and family on UE anatomy, dx. Pt and family acknowledged understanding.  OT educated pt and family on joint protection. Handout provided, see pt instructions. Pt and family acknowledged understanding.  OT recommended to trial wearing over-the-counter wrist brace at night while using sleep positioning strategies. Pt verbalized understanding. OT recommended to pt to bring wrist brace to next OT session to assess fit. Pt verbalized understanding.   PATIENT EDUCATION: Education  details: see today's tx above Person educated:  Patient and Child(ren) Education method: Explanation, Demonstration, Tactile cues, Verbal cues, and Handouts Education comprehension: verbalized understanding, returned demonstration, verbal cues required, tactile cues required, and needs further education  HOME EXERCISE PROGRAM: 03/16/24: Tendon gliding Exercises 03/21/24 - sleep positioning, joint protection   GOALS: Goals reviewed with patient? Yes    SHORT TERM GOALS: Target date: 04/07/14   Patient will demonstrate initial BUE HEP with 25% verbal cues or less for proper execution. Baseline: New to outpt OT Goal status: IN Progress - Tendon Gliding Exc issued at eval.   2.  Pt to trial prefab hand splints to help improve numbness and improve overall comfort for functional use of UEs, nighttime support and pain management .  Baseline: Pt has splint at home but hasn't found it to be comfortable - encouraged to bring it to next appt Goal status: IN Progress   3.  Pt will independently recall at least 3 joint protection, ergonomics, and body mechanic principles as noted in pt instructions to assist with daily tasks with increased comfort and confidence.  Baseline:  New to outpt OT Goal status: in progress   4.  Patient will demonstrate no loss of grip strength and/or improvement as needed to minimize dropping objects and manage containers etc. Baseline: Right: 47.5 lbs; Left 42.4 lbs Goal status: INITIAL   5.  Pt will independently recall sleep positioning options as noted in pt instructions to improve sleep disturbances from moderate to minimal.  Baseline:  mod difficulty per QuickDash Goal status: INITIAL     LONG TERM GOALS: Target date: 04/28/24   Patient will demonstrate updated BUE HEP with visual instruction only for proper execution. Baseline: New to outpt OT Goal status: IN Progress - Tendon Gliding Exc issued at eval.   2.  Pt will independently recall at least 3 options for adaptive equipment to assist with joint  protection, ergonomics, and body mechanic principles as noted in pt instructions for ADLs and IADLS.  Baseline:  New to outpt OT Goal status: INITIAL   3.  Pt will be independent with home based pain management routine to potentially include gloves/splints, heat and joint protection principles for minimal pain. Baseline: 8-9/10 Goal status: INITIAL   4.  Patient will demonstrate < 50% disability or less, indicating improved functional use of affected extremity.  Baseline: QuickDash 54.5% Goal status: INITIAL   ASSESSMENT:  CLINICAL IMPRESSION: Pt tolerated tasks well and verbalized good understanding of joint protection and sleep positioning education. Pt would benefit from additional review of joint protection and energy conservation during functional tasks. Pt would benefit from skilled OT services in the outpatient setting to work on impairments as noted below to help pt return to PLOF as able.    PERFORMANCE DEFICITS: in functional skills including ADLs, IADLs, coordination, dexterity, sensation, edema, ROM, strength, pain, flexibility, Fine motor control, Gross motor control, mobility, balance, body mechanics, endurance, decreased knowledge of precautions, decreased knowledge of use of DME, and UE functional use, cognitive skills including problem solving, safety awareness, and understand, and psychosocial skills including coping strategies, environmental adaptation, and routines and behaviors.   IMPAIRMENTS: are limiting patient from ADLs, IADLs, rest and sleep, and leisure.   CO-MORBIDITIES: may have co-morbidities  that affects occupational performance. Patient will benefit from skilled OT to address above impairments and improve overall function.  MODIFICATION OR ASSISTANCE TO COMPLETE EVALUATION: Min-Moderate modification of tasks or assist with assess necessary to complete an evaluation.  OT OCCUPATIONAL  PROFILE AND HISTORY: Detailed assessment: Review of records and additional  review of physical, cognitive, psychosocial history related to current functional performance.  CLINICAL DECISION MAKING: Moderate - several treatment options, min-mod task modification necessary  REHAB POTENTIAL: Good  EVALUATION COMPLEXITY: Moderate    PLAN:  OT FREQUENCY: 1-2x/week  OT DURATION: up to 6 weeks with 4 weeks scheduled at this time  PLANNED INTERVENTIONS: 97535 self care/ADL training, 16109 therapeutic exercise, 97530 therapeutic activity, 97112 neuromuscular re-education, 97140 manual therapy, 97035 ultrasound, 97018 paraffin, 60454 fluidotherapy, 97010 moist heat, balance training, visual/perceptual remediation/compensation, energy conservation, coping strategies training, patient/family education, and DME and/or AE instructions  RECOMMENDED OTHER SERVICES: N/A at this time  CONSULTED AND AGREED WITH PLAN OF CARE: Patient and family member/caregiver  PLAN FOR NEXT SESSION:  Review tendon gliding exercises Pt to bring over-the-counter brace for Lt wrist to next session Modalities - fluido/US /paraffin Review joint protection during functional tasks Progress HEPs as able - elbow, shoulder ROM Sensory education   Oakley Bellman, OT 03/21/2024, 12:56 PM

## 2024-03-21 NOTE — Patient Instructions (Addendum)
 [name]

## 2024-03-24 ENCOUNTER — Ambulatory Visit: Admitting: Occupational Therapy

## 2024-03-24 DIAGNOSIS — R208 Other disturbances of skin sensation: Secondary | ICD-10-CM

## 2024-03-24 DIAGNOSIS — M25512 Pain in left shoulder: Secondary | ICD-10-CM

## 2024-03-24 DIAGNOSIS — R278 Other lack of coordination: Secondary | ICD-10-CM

## 2024-03-24 DIAGNOSIS — R29898 Other symptoms and signs involving the musculoskeletal system: Secondary | ICD-10-CM

## 2024-03-24 DIAGNOSIS — R29818 Other symptoms and signs involving the nervous system: Secondary | ICD-10-CM

## 2024-03-24 DIAGNOSIS — M79641 Pain in right hand: Secondary | ICD-10-CM | POA: Diagnosis not present

## 2024-03-24 DIAGNOSIS — M6281 Muscle weakness (generalized): Secondary | ICD-10-CM

## 2024-03-24 NOTE — Patient Instructions (Addendum)
          Adjustment: 2x per day, 5 reps, 5 second holds.

## 2024-03-24 NOTE — Therapy (Signed)
 OUTPATIENT OCCUPATIONAL THERAPY NEURO Treatment  Patient Name: [name] MRN: [id number] DOB:February 24, 1931, 89 y.o., female Today's Date: 03/24/2024  PCP: Aloha Arnold, PA-C REFERRING PROVIDER: Omega Bible, MD  END OF SESSION:  OT End of Session - 03/24/24 1456     Visit Number 3    Number of Visits 12    Authorization Type [employer] 2025 $15 copay    Authorization Time Period Met VL: [place] Auth Not Reqd    Progress Note Due on Visit 10    OT Start Time 1406    OT Stop Time 1446    OT Time Calculation (min) 40 min    Activity Tolerance Patient tolerated treatment well    Behavior During Therapy WFL for tasks assessed/performed               Past Medical History:  Diagnosis Date   Anemia    low iron   Chronic renal insufficiency, stage III (moderate) (HCC)    DDD (degenerative disc disease), lumbar    Diverticulosis    Dizziness    Dyspnea    thinks it's due to the back pain   Factor 5 Leiden mutation, heterozygous (HCC)    GERD (gastroesophageal reflux disease)    Herpes simplex infection    Hiatal hernia    History of anemia    History of atrial fibrillation    HTN (hypertension)    IBS (irritable bowel syndrome)    Mixed hyperlipidemia    Osteoarthritis    Osteopenia    PONV (postoperative nausea and vomiting)    Pre-diabetes    Prediabetes    SOB (shortness of breath) on exertion    Spinal stenosis of lumbar region at multiple levels    Past Surgical History:  Procedure Laterality Date   COLONOSCOPY     DENTAL SURGERY     EYE SURGERY Bilateral    cataract surgery with lens implants   KIDNEY SURGERY Left 01/03/2009   left partial nephrectomy (pathology: leiomyoma)   LUMBAR LAMINECTOMY/DECOMPRESSION MICRODISCECTOMY N/A 09/20/2020   Procedure: LUMBAR LAMINECTOMY/DECOMPRESSION MICRODISCECTOMY LUMBAR TWO TO LUMBAR FIVE;  Surgeon: Audie Bleacher, MD;  Location: [facility] OR;  Service: Neurosurgery;  Laterality: N/A;   TOTAL KNEE ARTHROPLASTY Left     TOTAL KNEE ARTHROPLASTY Right    Patient Active Problem List   Diagnosis Date Noted   Community acquired pneumonia 09/27/2023   Pneumonia 09/27/2023   Family history of factor V Leiden mutation 10/13/2021   Lumbar stenosis with neurogenic claudication 09/20/2020   Chest pain 05/24/2020   Pulmonary nodule 05/24/2020   HLD (hyperlipidemia) 05/24/2020   Chest pain, atypical 02/23/2017   Disequilibrium 02/23/2017   Essential hypertension 02/23/2017   Dizziness 02/12/2017   Chronic headaches 02/12/2017   Lightheadedness 02/12/2017   Chest tightness 02/12/2017   Shoulder pain 02/12/2017   Shortness of breath 02/12/2017    ONSET DATE: 02/18/2024  REFERRING DIAG: G62.9 (ICD-10-CM) - Neuropathy Eval and treat bilateral hand numbness  THERAPY DIAG:  Pain in right hand  Other disturbances of skin sensation  Muscle weakness (generalized)  Other lack of coordination  Left shoulder pain, unspecified chronicity  Other symptoms and signs involving the musculoskeletal system  Other symptoms and signs involving the nervous system  Rationale for Evaluation and Treatment: Rehabilitation  SUBJECTIVE:   SUBJECTIVE STATEMENT: Pt reported pain at base of thumb. Pt reported exercises going well though unsure if completing too frequently. Pt reported late afternoon appointments are not preferred and therefore tired today. Pt's  family member reported pt typically naps around this time of day. OT confirmed all remaining appointments are morning times.  Pt accompanied by: family member - [name]  PERTINENT HISTORY:   PMHx: HTN (no longer on BP med per pt), headaches, L shoulder pain, lumbar stenosis,   MD note/PLAN 02/18/24:  INTERMITTENT NUMBNESS IN FINGERTIPS / TOES (since ~2021; worse in last few months) - check neuropathy labs (eval for other systemic causes of neuropathy) - will request MRI cervical spine results (however, patient not likely surgical candidate) - avoid compression  at elbows / wrists; if patient wants to pursue carpal tunnel syndrome surgery, then follow up with orthopedic clinic for testing and treatment (although symptoms are more widespread than localized compression neuropathies) - WEAR WRIST SPLINT AT BEDTIME  -Return for pending test results, pending if symptoms worsen or fail to improve.  02/29/24 - MRI report from [facility] - Multilevel degenerative changes noted at C4-5, C5-6 and C6-7 with mild spinal stenosis(narrowing). These findings could contribute to numbness in the hands. Dr. Salli Crawley recommends she f/u with orthopedic clinic if you would like to pursue surgery or epidural steroid injections.   02/22/24 - Radiographs taken this day demonstrate what appears to be a history of small tears at the insertion of the peroneus brevis tendon and at the base of the fifth metatarsal.  There does appear to be a small fracture nondisplaced transversely on 1 view of the radiograph.  Plan: Placed her in a short cam boot and I will follow-up with her in 1 month  PRECAUTIONS: Fall and Other: Cam boot RLE  WEIGHT BEARING RESTRICTIONS: Yes WBAT in CAM boot at this time  PAIN: N/A upon arrival  Are you having pain? At worst Yes: NPRS scale: 7/10 Pain location: hand joints, base of R thumb Pain description: deep aching Aggravating factors: writing, sewing Relieving factors: stop using hands, Tylenol , Volatren on hands  FALLS: Has patient fallen in last 6 months? Yes. Number of falls 1 but > 6 months ago  LIVING ENVIRONMENT: Lives with: lives alone, daughter [name] lives behind her and is in/out of the house, she is present for AM/PM ADLS to make sure she doesn't fall, may help her get clothes over head, off arm etc Lives in: House Stairs: No Has following equipment at home: Single point cane, Environmental consultant - 2 wheeled, Wheelchair (manual), Shower bench, Grab bars, and walk-in shower and widened doors  PLOF: Independent with household mobility with device,  Needs assistance with ADLs, and Needs assistance with homemaking   PATIENT GOALS: decreased numbness and pain in hands so she can do her daily activities  OBJECTIVE:  Note: Objective measures were completed at Evaluation unless otherwise noted.  HAND DOMINANCE: Right  ADLs: Overall ADLs: Mod Ind to [employer] Transfers/ambulation related to ADLs: Supervision in/out of the bed  Eating: Some help if things are hard to cut Grooming: Help as needed based on pain/swelling and numbness UB Dressing: Often help to pull bra down in the back - using pull over bra LB Dressing: Some trouble with R foot - ie) help with sock/shoe Toileting: Ind Bathing: Showers herself Tub Shower transfers: Mod Ind to supervision (esp with CAM boot at this time) Equipment: Grab bars, Walk in shower, and built in seat  IADLs:   Shopping: Assistance from daughter Light housekeeping: Some self performance/some family assist Meal Prep: Some self performance/some family assist Community mobility: Supervision with UP walker Medication management: TBA Financial management: TBA Handwriting:  Not assessed but pt  is part of a Christmas card program and often writes  50-100 Christmas cards at a time - This can cause pain.  She does like a pen with gripper.  MOBILITY STATUS: Needs Assist: UP walker and Hx of falls - family reports walking 1 mile a couple of times/week  POSTURE COMMENTS:  rounded shoulders and forward head Sitting balance: WFL  ACTIVITY TOLERANCE: Activity tolerance: Good  FUNCTIONAL OUTCOME MEASURES: Quick Dash: 54.5  UPPER EXTREMITY ROM:    Active ROM Right eval Left eval  Shoulder flexion 145 H/o of sh lim ~ 120  Shoulder abduction limited limited  Shoulder adduction    Shoulder extension    Shoulder internal rotation    Shoulder external rotation    Elbow flexion [facility] WFL  Elbow extension    Wrist flexion limited limited  Wrist extension    Wrist ulnar deviation    Wrist radial  deviation    Wrist pronation    Wrist supination    (Blank rows = not tested)  UPPER EXTREMITY MMT:     MMT Right eval Left eval  Shoulder flexion 3- 3-  Shoulder abduction    Shoulder adduction    Shoulder extension    Shoulder internal rotation    Shoulder external rotation    Middle trapezius    Lower trapezius    Elbow flexion 3 3  Elbow extension 3 3  Wrist flexion    Wrist extension    Wrist ulnar deviation    Wrist radial deviation    Wrist pronation    Wrist supination    (Blank rows = not tested)  HAND FUNCTION: Grip strength: Right: 46.5, 48.5 lbs; Left: 42.3, 42.5 lbs Hurts in the right thumb Average: Right: 47.5 lbs; Left 42.4 lbs  COORDINATION: 9 Hole Peg test: Right: 25.67 sec; Left: 24.51 sec  SENSATION: Numbness  INTERMITTENT NUMBNESS IN FINGERTIPS / TOES (since ~2021; worse in last few months)  EDEMA: hand joints swell  MUSCLE TONE: WFL  COGNITION: Overall cognitive status: Within functional limits for tasks assessed  VISION: Subjective report: Likes things printed larger; does use magnify glass at times Baseline vision: Bifocals Visual history:  TBA  OBSERVATIONS: Pt ambulates with UP walker and no loss of balance. The pt is well kept and has CAM boot donned on RLE.                                                                                                                            TREATMENT DATE:  Self-Care Moist heat application to B hands - 10 minutes - to decrease pain and stiffness of B hands. For duration of moist heat, OT educated pt on Heat home program. Handout provided, see pt instructions.   OT educated pt on joint protection. Handout provided, see pt instructions. Pt verbalized understanding of all.  OT assessed pt's over-the-counter wrist splint. OT educated pt on splint purpose, wear schedule, and fit. Pt acknowledged understanding. Pt ind donned/doffed  splint.  OT provided foam tubing for built-up toothbrush handle -  for joint protection, to decrease pain/discomfort during ADL grooming tasks. Pt verbalized understanding.  Neuro Re-Ed HEP Update: Brachial plexus Nerve glides - Handout provided, see pt instructions. - for nerve lengthening, to decrease pain/discomfort, for proprioception and gross motor coordination. Pt benefited from mod then fading cues. Pt returned demo though benefited from cues to avoid sharp pain and tingling symptoms, avoid shoulder ABD greater than 90*, attend to upright seated posture, avoid compensatory shoulder movements. OT recommended to use upright mirror to monitor posture and to avoid compensatory shoulder movements at home. Pt acknowledged understanding.  Flex bar - yellow - 1 sets, 10 reps, supination and pronation - to improve BUE strengthening, proprioception, gross motor coordination.   PATIENT EDUCATION: Education details: see today's tx above Person educated: Patient and Child(ren) Education method: Explanation, Demonstration, Tactile cues, Verbal cues, and Handouts Education comprehension: verbalized understanding, returned demonstration, verbal cues required, tactile cues required, and needs further education  HOME EXERCISE PROGRAM: 03/16/24: Tendon gliding Exercises 03/21/24 - sleep positioning, joint protection 03/24/24 - joint protection,    GOALS: Goals reviewed with patient? Yes    SHORT TERM GOALS: Target date: 04/07/14   Patient will demonstrate initial BUE HEP with 25% verbal cues or less for proper execution. Baseline: New to outpt OT Goal status: IN Progress - Tendon Gliding Exc issued at eval.   2.  Pt to trial prefab hand splints to help improve numbness and improve overall comfort for functional use of UEs, nighttime support and pain management .  Baseline: Pt has splint at home but hasn't found it to be comfortable - encouraged to bring it to next appt Goal status: IN Progress   3.  Pt will independently recall at least 3 joint protection,  ergonomics, and body mechanic principles as noted in pt instructions to assist with daily tasks with increased comfort and confidence.  Baseline:  New to outpt OT Goal status: in progress   4.  Patient will demonstrate no loss of grip strength and/or improvement as needed to minimize dropping objects and manage containers etc. Baseline: Right: 47.5 lbs; Left 42.4 lbs Goal status: INITIAL   5.  Pt will independently recall sleep positioning options as noted in pt instructions to improve sleep disturbances from moderate to minimal.  Baseline:  mod difficulty per QuickDash Goal status: INITIAL     LONG TERM GOALS: Target date: 04/28/24   Patient will demonstrate updated BUE HEP with visual instruction only for proper execution. Baseline: New to outpt OT Goal status: IN Progress - Tendon Gliding Exc issued at eval.   2.  Pt will independently recall at least 3 options for adaptive equipment to assist with joint protection, ergonomics, and body mechanic principles as noted in pt instructions for ADLs and IADLS.  Baseline:  New to outpt OT Goal status: INITIAL   3.  Pt will be independent with home based pain management routine to potentially include gloves/splints, heat and joint protection principles for minimal pain. Baseline: 8-9/10 Goal status: INITIAL   4.  Patient will demonstrate < 50% disability or less, indicating improved functional use of affected extremity.  Baseline: QuickDash 54.5% Goal status: INITIAL   ASSESSMENT:  CLINICAL IMPRESSION: Pt tolerated tasks fairly well though demo'd low energy levels today possibly d/t afternoon session time. Pt reported preference for morning appointment times and OT confirmed remaining appointments were scheduled in morning. Pt returned demo of nerve glides though benefited from cueing for  upright seated posture and positioning of BUE during task. Pt would benefit from skilled OT services in the outpatient setting to work on impairments as  noted below to help pt return to PLOF as able.    PERFORMANCE DEFICITS: in functional skills including ADLs, IADLs, coordination, dexterity, sensation, edema, ROM, strength, pain, flexibility, Fine motor control, Gross motor control, mobility, balance, body mechanics, endurance, decreased knowledge of precautions, decreased knowledge of use of DME, and UE functional use, cognitive skills including problem solving, safety awareness, and understand, and psychosocial skills including coping strategies, environmental adaptation, and routines and behaviors.   IMPAIRMENTS: are limiting patient from ADLs, IADLs, rest and sleep, and leisure.   CO-MORBIDITIES: may have co-morbidities  that affects occupational performance. Patient will benefit from skilled OT to address above impairments and improve overall function.  MODIFICATION OR ASSISTANCE TO COMPLETE EVALUATION: Min-Moderate modification of tasks or assist with assess necessary to complete an evaluation.  OT OCCUPATIONAL PROFILE AND HISTORY: Detailed assessment: Review of records and additional review of physical, cognitive, psychosocial history related to current functional performance.  CLINICAL DECISION MAKING: Moderate - several treatment options, min-mod task modification necessary  REHAB POTENTIAL: Good  EVALUATION COMPLEXITY: Moderate    PLAN:  OT FREQUENCY: 1-2x/week  OT DURATION: up to 6 weeks with 4 weeks scheduled at this time  PLANNED INTERVENTIONS: 97535 self care/ADL training, 04540 therapeutic exercise, 97530 therapeutic activity, 97112 neuromuscular re-education, 97140 manual therapy, 97035 ultrasound, 97018 paraffin, 98119 fluidotherapy, 97010 moist heat, balance training, visual/perceptual remediation/compensation, energy conservation, coping strategies training, patient/family education, and DME and/or AE instructions  RECOMMENDED OTHER SERVICES: N/A at this time  CONSULTED AND AGREED WITH PLAN OF CARE: Patient and  family member/caregiver  PLAN FOR NEXT SESSION:  Review tendon gliding exercises, nerve glides Assess wrist brace again PRN Modalities - fluido/US /paraffin Review joint protection during functional tasks Progress HEPs as able - elbow, shoulder ROM Sensory education   Oakley Bellman, OT 03/24/2024, 3:11 PM

## 2024-03-28 ENCOUNTER — Ambulatory Visit (INDEPENDENT_AMBULATORY_CARE_PROVIDER_SITE_OTHER)

## 2024-03-28 ENCOUNTER — Encounter: Payer: Self-pay | Admitting: Podiatry

## 2024-03-28 ENCOUNTER — Ambulatory Visit: Admitting: Podiatry

## 2024-03-28 DIAGNOSIS — S92354D Nondisplaced fracture of fifth metatarsal bone, right foot, subsequent encounter for fracture with routine healing: Secondary | ICD-10-CM

## 2024-03-28 NOTE — Progress Notes (Signed)
 She presents today for follow-up of fifth met base fracture of the right foot.  States has been feeling pretty good particular wearing the boot.  Has not been barefooted.  She states that she cannot wait to get back into regular shoes.  Objective: Vital signs are stable alert oriented x 3.  Pulses are palpable.  There is no erythema edema salines drainage or odor there is no pain on palpation of the fifth metatarsal base there is no pain on abduction against resistance.  Radiographs taken today demonstrate osseously mature individual generalized demineralization is noted.  Healing of the fifth met base fragment.  Assessment: Plantar fracture of the fifth met base of the right foot.  Plan: Allow her to get back into her regular tennis shoes recommend she not go barefooted.  If she starts to experience some tenderness go back in her boot for a week at a time we will follow-up with her on an as-needed basis.

## 2024-03-30 ENCOUNTER — Ambulatory Visit: Attending: Diagnostic Neuroimaging | Admitting: Occupational Therapy

## 2024-03-30 DIAGNOSIS — M79641 Pain in right hand: Secondary | ICD-10-CM | POA: Diagnosis not present

## 2024-03-30 DIAGNOSIS — M6281 Muscle weakness (generalized): Secondary | ICD-10-CM | POA: Diagnosis not present

## 2024-03-30 DIAGNOSIS — R208 Other disturbances of skin sensation: Secondary | ICD-10-CM | POA: Diagnosis not present

## 2024-03-30 DIAGNOSIS — R29818 Other symptoms and signs involving the nervous system: Secondary | ICD-10-CM | POA: Diagnosis not present

## 2024-03-30 DIAGNOSIS — R278 Other lack of coordination: Secondary | ICD-10-CM | POA: Insufficient documentation

## 2024-03-30 NOTE — Patient Instructions (Signed)
 Access Code: [email or web address] URL: [email or web address] Date: 03/30/2024 Prepared by: Sudie Ely  Exercises - Seated Cervical Traction  - 1-3 x daily - 10 reps

## 2024-03-30 NOTE — Therapy (Signed)
 OUTPATIENT OCCUPATIONAL THERAPY NEURO TREATMENT  Patient Name: [name] MRN: [id number] DOB:09-19-1931, 89 y.o., female Today's Date: 03/30/2024  PCP: Aloha Arnold, PA-C REFERRING PROVIDER: Omega Bible, MD  END OF SESSION:  OT End of Session - 03/30/24 0756     Visit Number 4    Number of Visits 12    Authorization Type [employer] 2025 $15 copay    Authorization Time Period Met VL: [place] Auth Not Reqd    Progress Note Due on Visit 10    OT Start Time 0800    OT Stop Time 0845    OT Time Calculation (min) 45 min    Equipment Utilized During Treatment rolled towel    Activity Tolerance Patient tolerated treatment well    Behavior During Therapy WFL for tasks assessed/performed               Past Medical History:  Diagnosis Date   Anemia    low iron   Chronic renal insufficiency, stage III (moderate) (HCC)    DDD (degenerative disc disease), lumbar    Diverticulosis    Dizziness    Dyspnea    thinks it's due to the back pain   Factor 5 Leiden mutation, heterozygous (HCC)    GERD (gastroesophageal reflux disease)    Herpes simplex infection    Hiatal hernia    History of anemia    History of atrial fibrillation    HTN (hypertension)    IBS (irritable bowel syndrome)    Mixed hyperlipidemia    Osteoarthritis    Osteopenia    PONV (postoperative nausea and vomiting)    Pre-diabetes    Prediabetes    SOB (shortness of breath) on exertion    Spinal stenosis of lumbar region at multiple levels    Past Surgical History:  Procedure Laterality Date   COLONOSCOPY     DENTAL SURGERY     EYE SURGERY Bilateral    cataract surgery with lens implants   KIDNEY SURGERY Left 01/03/2009   left partial nephrectomy (pathology: leiomyoma)   LUMBAR LAMINECTOMY/DECOMPRESSION MICRODISCECTOMY N/A 09/20/2020   Procedure: LUMBAR LAMINECTOMY/DECOMPRESSION MICRODISCECTOMY LUMBAR TWO TO LUMBAR FIVE;  Surgeon: Audie Bleacher, MD;  Location: [facility] OR;  Service:  Neurosurgery;  Laterality: N/A;   TOTAL KNEE ARTHROPLASTY Left    TOTAL KNEE ARTHROPLASTY Right    Patient Active Problem List   Diagnosis Date Noted   Community acquired pneumonia 09/27/2023   Pneumonia 09/27/2023   Family history of factor V Leiden mutation 10/13/2021   Lumbar stenosis with neurogenic claudication 09/20/2020   Chest pain 05/24/2020   Pulmonary nodule 05/24/2020   HLD (hyperlipidemia) 05/24/2020   Chest pain, atypical 02/23/2017   Disequilibrium 02/23/2017   Essential hypertension 02/23/2017   Dizziness 02/12/2017   Chronic headaches 02/12/2017   Lightheadedness 02/12/2017   Chest tightness 02/12/2017   Shoulder pain 02/12/2017   Shortness of breath 02/12/2017    ONSET DATE: 02/18/2024  REFERRING DIAG: G62.9 (ICD-10-CM) - Neuropathy Eval and treat bilateral hand numbness  THERAPY DIAG:  Pain in right hand  Other disturbances of skin sensation  Other symptoms and signs involving the nervous system  Rationale for Evaluation and Treatment: Rehabilitation  SUBJECTIVE:   SUBJECTIVE STATEMENT:  Pt arrived without fracture boot in place it was removed Tuesday.  Pt reported she was a bit sore today but her daughter had her 'confess' what she did yesterday ie) cleaned car and car mats yesterday handheld scrub brush  Pt accompanied by: family  member - Administrator, sports  PERTINENT HISTORY:   PMHx: HTN (no longer on BP med per pt), headaches, L shoulder pain, lumbar stenosis,   MD note/PLAN 02/18/24:  INTERMITTENT NUMBNESS IN FINGERTIPS / TOES (since ~2021; worse in last few months) - check neuropathy labs (eval for other systemic causes of neuropathy) - will request MRI cervical spine results (however, patient not likely surgical candidate) - avoid compression at elbows / wrists; if patient wants to pursue carpal tunnel syndrome surgery, then follow up with orthopedic clinic for testing and treatment (although symptoms are more widespread than localized compression  neuropathies) - WEAR WRIST SPLINT AT BEDTIME  -Return for pending test results, pending if symptoms worsen or fail to improve.  02/29/24 - MRI report from [facility] - Multilevel degenerative changes noted at C4-5, C5-6 and C6-7 with mild spinal stenosis(narrowing). These findings could contribute to numbness in the hands. Dr. Salli Crawley recommends she f/u with orthopedic clinic if you would like to pursue surgery or epidural steroid injections.   02/22/24 - Radiographs taken this day demonstrate what appears to be a history of small tears at the insertion of the peroneus brevis tendon and at the base of the fifth metatarsal.  There does appear to be a small fracture nondisplaced transversely on 1 view of the radiograph.  Plan: Placed her in a short cam boot and I will follow-up with her in 1 month  PRECAUTIONS: Fall and Other: Cam boot RLE  WEIGHT BEARING RESTRICTIONS: Yes WBAT in CAM boot at this time  PAIN: N/A upon arrival  Are you having pain? At worst Yes: NPRS scale: 6/10 Pain location: hand joints, base of R thumb Pain description: deep aching Aggravating factors: writing, sewing Relieving factors: stop using hands, Tylenol , Volatren on hands  FALLS: Has patient fallen in last 6 months? Yes. Number of falls 1 but > 6 months ago  LIVING ENVIRONMENT: Lives with: lives alone, daughter [name] lives behind her and is in/out of the house, she is present for AM/PM ADLS to make sure she doesn't fall, may help her get clothes over head, off arm etc Lives in: House Stairs: No Has following equipment at home: Single point cane, Environmental consultant - 2 wheeled, Wheelchair (manual), Shower bench, Grab bars, and walk-in shower and widened doors  PLOF: Independent with household mobility with device, Needs assistance with ADLs, and Needs assistance with homemaking   PATIENT GOALS: decreased numbness and pain in hands so she can do her daily activities  OBJECTIVE:  Note: Objective measures were completed  at Evaluation unless otherwise noted.  HAND DOMINANCE: Right  ADLs: Overall ADLs: Mod Ind to [employer] Transfers/ambulation related to ADLs: Supervision in/out of the bed  Eating: Some help if things are hard to cut Grooming: Help as needed based on pain/swelling and numbness UB Dressing: Often help to pull bra down in the back - using pull over bra LB Dressing: Some trouble with R foot - ie) help with sock/shoe Toileting: Ind Bathing: Showers herself Tub Shower transfers: Mod Ind to supervision (esp with CAM boot at this time) Equipment: Grab bars, Walk in shower, and built in seat  IADLs:   Shopping: Assistance from daughter Light housekeeping: Some self performance/some family assist Meal Prep: Some self performance/some family assist Community mobility: Supervision with UP walker Medication management: TBA Financial management: TBA Handwriting:  Not assessed but 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  She  does like a pen with gripper.  MOBILITY STATUS: Needs Assist: UP walker and Hx of falls - family reports walking 1 mile a couple of times/week  POSTURE COMMENTS:  rounded shoulders and forward head Sitting balance: WFL  ACTIVITY TOLERANCE: Activity tolerance: Good  FUNCTIONAL OUTCOME MEASURES: Quick Dash: 54.5  UPPER EXTREMITY ROM:    Active ROM Right eval Left eval  Shoulder flexion 145 H/o of sh lim ~ 120  Shoulder abduction limited limited  Shoulder adduction    Shoulder extension    Shoulder internal rotation    Shoulder external rotation    Elbow flexion [facility] WFL  Elbow extension    Wrist flexion limited limited  Wrist extension    Wrist ulnar deviation    Wrist radial deviation    Wrist pronation    Wrist supination    (Blank rows = not tested)  UPPER EXTREMITY MMT:     MMT Right eval Left eval  Shoulder flexion 3- 3-  Shoulder abduction    Shoulder adduction     Shoulder extension    Shoulder internal rotation    Shoulder external rotation    Middle trapezius    Lower trapezius    Elbow flexion 3 3  Elbow extension 3 3  Wrist flexion    Wrist extension    Wrist ulnar deviation    Wrist radial deviation    Wrist pronation    Wrist supination    (Blank rows = not tested)  HAND FUNCTION: Grip strength: Right: 46.5, 48.5 lbs; Left: 42.3, 42.5 lbs Hurts in the right thumb Average: Right: 47.5 lbs; Left 42.4 lbs  COORDINATION: 9 Hole Peg test: Right: 25.67 sec; Left: 24.51 sec  SENSATION: Numbness  INTERMITTENT NUMBNESS IN FINGERTIPS / TOES (since ~2021; worse in last few months)  EDEMA: hand joints swell  MUSCLE TONE: WFL  COGNITION: Overall cognitive status: Within functional limits for tasks assessed  VISION: Subjective report: Likes things printed larger; does use magnify glass at times Baseline vision: Bifocals Visual history:  TBA  OBSERVATIONS: Pt ambulates with UP walker and no loss of balance. The pt is well kept and has CAM boot donned on RLE.                                                                                                                            TREATMENT DATE:  Self-Care  OT educated pt on joint protection principles as pt overdid it yesterday and ideas shared to improve UE pain.   Patient encouraged to protect hands and wrist by  - Respecting for Pain and stopping activities before they reach the point of discomfort or pain  - Rest and Work Balance ie) balancing activities with appropriate rests during activity - she could have divided the task up throughout the day or over the week - pt and daughter encouraged to work on daily/weekly schedule so patient can ask for help, plan her chores and decrease risk for repetitive  tasks that cause pain - Use of Assistive Equipment - Consider splint use and AE equipment to protect joints from deformity and stresses  Then reviewed activities that can be  modified with adaptive equipment and encourage patient to look adaptive equipment for arthritis [i.e. to consider joint protection].   Therapeutic Exercise Per MR review, pt has multilevel degenerative changes noted at C4-5, C5-6 and C6-7 with mild spinal stenosis(narrowing) with suggestion that this could contribute to numbness in the hands therefore explored some light Seated Cervical Traction in sitting upright holding the ends of a towel that was placed below the back of her head so she could gently pull the towel forward and upwards with both hands, until she felt pressure relief in your neck. Instructed to Hold, then relax and repeat., make sure to sit tall and keep your neck relaxed. Instructed not to pull her body or head forward or shrug her shoulders during the exercise.  PATIENT EDUCATION: Education details: cervical traction and further joint protection Person educated: Patient and Child(ren) Education method: Explanation, Demonstration, Tactile cues, Verbal cues, and Handouts Education comprehension: verbalized understanding, returned demonstration, verbal cues required, tactile cues required, and needs further education  HOME EXERCISE PROGRAM: 03/16/24: Tendon gliding Exercises 03/21/24 - sleep positioning, joint protection 03/24/24 - joint protection,  03/30/24 - Cervical traction Access Code: [email or web address]  GOALS: Goals reviewed with patient? Yes    SHORT TERM GOALS: Target date: 04/07/14   Patient will demonstrate initial BUE HEP with 25% verbal cues or less for proper execution. Baseline: New to outpt OT Goal status: IN Progress - Tendon Gliding Exc issued at eval.   2.  Pt to trial prefab hand splints to help improve numbness and improve overall comfort for functional use of UEs, nighttime support and pain management .  Baseline: Pt has splint at home but hasn't found it to be comfortable - encouraged to bring it to next appt Goal status: IN Progress   3.  Pt will independently  recall at least 3 joint protection, ergonomics, and body mechanic principles as noted in pt instructions to assist with daily tasks with increased comfort and confidence.  Baseline:  New to outpt OT Goal status: in progress   4.  Patient will demonstrate no loss of grip strength and/or improvement as needed to minimize dropping objects and manage containers etc. Baseline: Right: 47.5 lbs; Left 42.4 lbs Goal status: IN Progress   5.  Pt will independently recall sleep positioning options as noted in pt instructions to improve sleep disturbances from moderate to minimal.  Baseline:  mod difficulty per QuickDash Goal status: INITIAL     LONG TERM GOALS: Target date: 04/28/24   Patient will demonstrate updated BUE HEP with visual instruction only for proper execution. Baseline: New to outpt OT Goal status: IN Progress - Tendon Gliding Exc issued at eval.   2.  Pt will independently recall at least 3 options for adaptive equipment to assist with joint protection, ergonomics, and body mechanic principles as noted in pt instructions for ADLs and IADLS.  Baseline:  New to outpt OT Goal status: IN Progress   3.  Pt will be independent with home based pain management routine to potentially include gloves/splints, heat and joint protection principles for minimal pain. Baseline: 8-9/10 Goal status: IN Progress   4.  Patient will demonstrate < 50% disability or less, indicating improved functional use of affected extremity.  Baseline: QuickDash 54.5% Goal status: IN Progress   ASSESSMENT:  CLINICAL IMPRESSION: Patient is  a 89 y.o. female who was seen today for occupational therapy treatment for UE dysfunction.  Pt returned demo of cervical traction and was receptive to further joint protection education with encouragement to plan her chores/activities and use splints or AE . Pt will benefit from continued skilled OT services in the outpatient setting to work on impairments as noted at  evaluation to help pt return to [facility] as able.    PERFORMANCE DEFICITS: in functional skills including ADLs, IADLs, coordination, dexterity, sensation, edema, ROM, strength, pain, flexibility, Fine motor control, Gross motor control, mobility, balance, body mechanics, endurance, decreased knowledge of precautions, decreased knowledge of use of DME, and UE functional use, cognitive skills including problem solving, safety awareness, and understand, and psychosocial skills including coping strategies, environmental adaptation, and routines and behaviors.   IMPAIRMENTS: are limiting patient from ADLs, IADLs, rest and sleep, and leisure.   CO-MORBIDITIES: may have co-morbidities  that affects occupational performance. Patient will benefit from skilled OT to address above impairments and improve overall function.  REHAB POTENTIAL: Good  PLAN:  OT FREQUENCY: 1-2x/week  OT DURATION: up to 6 weeks with 4 weeks scheduled at this time  PLANNED INTERVENTIONS: 97535 self care/ADL training, 86578 therapeutic exercise, 97530 therapeutic activity, 97112 neuromuscular re-education, 97140 manual therapy, 97035 ultrasound, 97018 paraffin, 46962 fluidotherapy, 97010 moist heat, balance training, visual/perceptual remediation/compensation, energy conservation, coping strategies training, patient/family education, and DME and/or AE instructions  RECOMMENDED OTHER SERVICES: N/A at this time  CONSULTED AND AGREED WITH PLAN OF CARE: Patient and family member/caregiver  PLAN FOR NEXT SESSION:  Review tendon gliding exercises, nerve glides Assess wrist brace again PRN Modalities - fluido/US /paraffin Review joint protection during functional tasks Progress HEPs as able - elbow, shoulder ROM Sensory education   Zora Hires, OT 03/30/2024, 5:12 PM

## 2024-03-31 ENCOUNTER — Ambulatory Visit: Admitting: Occupational Therapy

## 2024-03-31 DIAGNOSIS — M79641 Pain in right hand: Secondary | ICD-10-CM | POA: Diagnosis not present

## 2024-03-31 DIAGNOSIS — R208 Other disturbances of skin sensation: Secondary | ICD-10-CM

## 2024-03-31 DIAGNOSIS — R29818 Other symptoms and signs involving the nervous system: Secondary | ICD-10-CM

## 2024-03-31 NOTE — Therapy (Signed)
 OUTPATIENT OCCUPATIONAL THERAPY NEURO TREATMENT  Patient Name: [name] MRN: [id number] DOB:Apr 04, 1931, 89 y.o., female Today's Date: 03/31/2024  PCP: Aloha Arnold, PA-C REFERRING PROVIDER: Omega Bible, MD  END OF SESSION:  OT End of Session - 03/31/24 1349     Visit Number 5    Number of Visits 12    Authorization Type [employer] 2025 $15 copay    Authorization Time Period Met VL: [place] Auth Not Reqd    Progress Note Due on Visit 10    OT Start Time 0850    OT Stop Time 0929    OT Time Calculation (min) 39 min    Activity Tolerance Patient tolerated treatment well    Behavior During Therapy WFL for tasks assessed/performed                Past Medical History:  Diagnosis Date   Anemia    low iron   Chronic renal insufficiency, stage III (moderate) (HCC)    DDD (degenerative disc disease), lumbar    Diverticulosis    Dizziness    Dyspnea    thinks it's due to the back pain   Factor 5 Leiden mutation, heterozygous (HCC)    GERD (gastroesophageal reflux disease)    Herpes simplex infection    Hiatal hernia    History of anemia    History of atrial fibrillation    HTN (hypertension)    IBS (irritable bowel syndrome)    Mixed hyperlipidemia    Osteoarthritis    Osteopenia    PONV (postoperative nausea and vomiting)    Pre-diabetes    Prediabetes    SOB (shortness of breath) on exertion    Spinal stenosis of lumbar region at multiple levels    Past Surgical History:  Procedure Laterality Date   COLONOSCOPY     DENTAL SURGERY     EYE SURGERY Bilateral    cataract surgery with lens implants   KIDNEY SURGERY Left 01/03/2009   left partial nephrectomy (pathology: leiomyoma)   LUMBAR LAMINECTOMY/DECOMPRESSION MICRODISCECTOMY N/A 09/20/2020   Procedure: LUMBAR LAMINECTOMY/DECOMPRESSION MICRODISCECTOMY LUMBAR TWO TO LUMBAR FIVE;  Surgeon: Audie Bleacher, MD;  Location: [facility] OR;  Service: Neurosurgery;  Laterality: N/A;   TOTAL KNEE ARTHROPLASTY Left     TOTAL KNEE ARTHROPLASTY Right    Patient Active Problem List   Diagnosis Date Noted   Community acquired pneumonia 09/27/2023   Pneumonia 09/27/2023   Family history of factor V Leiden mutation 10/13/2021   Lumbar stenosis with neurogenic claudication 09/20/2020   Chest pain 05/24/2020   Pulmonary nodule 05/24/2020   HLD (hyperlipidemia) 05/24/2020   Chest pain, atypical 02/23/2017   Disequilibrium 02/23/2017   Essential hypertension 02/23/2017   Dizziness 02/12/2017   Chronic headaches 02/12/2017   Lightheadedness 02/12/2017   Chest tightness 02/12/2017   Shoulder pain 02/12/2017   Shortness of breath 02/12/2017    ONSET DATE: 02/18/2024  REFERRING DIAG: G62.9 (ICD-10-CM) - Neuropathy Eval and treat bilateral hand numbness  THERAPY DIAG:  Pain in right hand  Other disturbances of skin sensation  Other symptoms and signs involving the nervous system  Rationale for Evaluation and Treatment: Rehabilitation  SUBJECTIVE:   SUBJECTIVE STATEMENT:  Pt's family member reported no longer wearing boot. Pt reported not wearing the boot is "wonderful." Pt reported initial soreness after removing boot though this has improved.   Pt accompanied by: family member - [name]  PERTINENT HISTORY:   PMHx: HTN (no longer on BP med per pt), headaches, L shoulder  pain, lumbar stenosis,   MD note/PLAN 02/18/24:  INTERMITTENT NUMBNESS IN FINGERTIPS / TOES (since ~2021; worse in last few months) - check neuropathy labs (eval for other systemic causes of neuropathy) - will request MRI cervical spine results (however, patient not likely surgical candidate) - avoid compression at elbows / wrists; if patient wants to pursue carpal tunnel syndrome surgery, then follow up with orthopedic clinic for testing and treatment (although symptoms are more widespread than localized compression neuropathies) - WEAR WRIST SPLINT AT BEDTIME  -Return for pending test results, pending if symptoms worsen or  fail to improve.  02/29/24 - MRI report from [facility] - Multilevel degenerative changes noted at C4-5, C5-6 and C6-7 with mild spinal stenosis(narrowing). These findings could contribute to numbness in the hands. Dr. Salli Crawley recommends she f/u with orthopedic clinic if you would like to pursue surgery or epidural steroid injections.   02/22/24 - Radiographs taken this day demonstrate what appears to be a history of small tears at the insertion of the peroneus brevis tendon and at the base of the fifth metatarsal.  There does appear to be a small fracture nondisplaced transversely on 1 view of the radiograph.  Plan: Placed her in a short cam boot and I will follow-up with her in 1 month  PRECAUTIONS: Fall and Other: Cam boot RLE  WEIGHT BEARING RESTRICTIONS: Yes WBAT in CAM boot at this time  PAIN: N/A upon arrival  Are you having pain? At worst Yes: NPRS scale: 5/10 Pain location: hand joints, base of R thumb Pain description: deep aching Aggravating factors: writing, sewing Relieving factors: stop using hands, Tylenol , Volatren on hands  FALLS: Has patient fallen in last 6 months? Yes. Number of falls 1 but > 6 months ago  LIVING ENVIRONMENT: Lives with: lives alone, daughter [name] lives behind her and is in/out of the house, she is present for AM/PM ADLS to make sure she doesn't fall, may help her get clothes over head, off arm etc Lives in: House Stairs: No Has following equipment at home: Single point cane, Environmental consultant - 2 wheeled, Wheelchair (manual), Shower bench, Grab bars, and walk-in shower and widened doors  PLOF: Independent with household mobility with device, Needs assistance with ADLs, and Needs assistance with homemaking   PATIENT GOALS: decreased numbness and pain in hands so she can do her daily activities  OBJECTIVE:  Note: Objective measures were completed at Evaluation unless otherwise noted.  HAND DOMINANCE: Right  ADLs: Overall ADLs: Mod Ind to [employer] Transfers/ambulation related to ADLs: Supervision in/out of the bed  Eating: Some help if things are hard to cut Grooming: Help as needed based on pain/swelling and numbness UB Dressing: Often help to pull bra down in the back - using pull over bra LB Dressing: Some trouble with R foot - ie) help with sock/shoe Toileting: Ind Bathing: Showers herself Tub Shower transfers: Mod Ind to supervision (esp with CAM boot at this time) Equipment: Grab bars, Walk in shower, and built in seat  IADLs:   Shopping: Assistance from daughter Light housekeeping: Some self performance/some family assist Meal Prep: Some self performance/some family assist Community mobility: Supervision with UP walker Medication management: TBA Financial management: TBA Handwriting:  Not assessed but 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  She does like a pen with gripper.  MOBILITY STATUS: Needs Assist: UP walker and Hx of falls - family reports  walking 1 mile a couple of times/week  POSTURE COMMENTS:  rounded shoulders and forward head Sitting balance: WFL  ACTIVITY TOLERANCE: Activity tolerance: Good  FUNCTIONAL OUTCOME MEASURES: Quick Dash: 54.5  UPPER EXTREMITY ROM:    Active ROM Right eval Left eval  Shoulder flexion 145 H/o of sh lim ~ 120  Shoulder abduction limited limited  Shoulder adduction    Shoulder extension    Shoulder internal rotation    Shoulder external rotation    Elbow flexion [facility] WFL  Elbow extension    Wrist flexion limited limited  Wrist extension    Wrist ulnar deviation    Wrist radial deviation    Wrist pronation    Wrist supination    (Blank rows = not tested)  UPPER EXTREMITY MMT:     MMT Right eval Left eval  Shoulder flexion 3- 3-  Shoulder abduction    Shoulder adduction    Shoulder extension    Shoulder internal rotation    Shoulder external rotation    Middle trapezius     Lower trapezius    Elbow flexion 3 3  Elbow extension 3 3  Wrist flexion    Wrist extension    Wrist ulnar deviation    Wrist radial deviation    Wrist pronation    Wrist supination    (Blank rows = not tested)  HAND FUNCTION: Grip strength: Right: 46.5, 48.5 lbs; Left: 42.3, 42.5 lbs Hurts in the right thumb Average: Right: 47.5 lbs; Left 42.4 lbs  COORDINATION: 9 Hole Peg test: Right: 25.67 sec; Left: 24.51 sec  SENSATION: Numbness  INTERMITTENT NUMBNESS IN FINGERTIPS / TOES (since ~2021; worse in last few months)  EDEMA: hand joints swell  MUSCLE TONE: WFL  COGNITION: Overall cognitive status: Within functional limits for tasks assessed  VISION: Subjective report: Likes things printed larger; does use magnify glass at times Baseline vision: Bifocals Visual history:  TBA  OBSERVATIONS: Pt ambulates with UP walker and no loss of balance. The pt is well kept and has CAM boot donned on RLE.                                                                                                                            TREATMENT DATE:  Self-Care  Paraffin wax BUE - 12 minutes - to reduce stiffness, to reduce pain. During paraffin wax application, OT educated on home paraffin wax options and showed examples online. Pt and family acknowledged understanding.   OT educated pt on sensory safety precautions. Handout provided, see pt instructions. Pt and family acknowledged understanding.   OT educated pt on energy conservation, UE anatomy, joint protection, and safety during daily tasks. Pt acknowledged understanding. Handout provided, see pt instructions. OT and pt discussed ways to apply energy conservation and joint protection strategies during pt's daily routine.   OT educated pt on deep breathing strategies and healthy stress management strategies. Pt acknowledged understanding and returned demo of deep breathing strategies. Handout  provided, see pt instructions.    PATIENT  EDUCATION: Education details: see today's tx above Person educated: Patient and Child(ren) Education method: Explanation, Demonstration, Tactile cues, Verbal cues, and Handouts Education comprehension: verbalized understanding, returned demonstration, verbal cues required, tactile cues required, and needs further education  HOME EXERCISE PROGRAM: 03/16/24: Tendon gliding Exercises 03/21/24 - sleep positioning, joint protection 03/24/24 - joint protection (see pt instructions) 03/30/24 - Cervical traction Access Code: [email or web address] 03/31/24 - sensory safety precautions, energy conservation and deep breathing (see pt instructions)  GOALS: Goals reviewed with patient? Yes    SHORT TERM GOALS: Target date: 04/07/14   Patient will demonstrate initial BUE HEP with 25% verbal cues or less for proper execution. Baseline: New to outpt OT Goal status: IN Progress - Tendon Gliding Exc issued at eval.   2.  Pt to trial prefab hand splints to help improve numbness and improve overall comfort for functional use of UEs, nighttime support and pain management .  Baseline: Pt has splint at home but hasn't found it to be comfortable - encouraged to bring it to next appt Goal status: IN Progress   3.  Pt will independently recall at least 3 joint protection, ergonomics, and body mechanic principles as noted in pt instructions to assist with daily tasks with increased comfort and confidence.  Baseline:  New to outpt OT Goal status: in progress   4.  Patient will demonstrate no loss of grip strength and/or improvement as needed to minimize dropping objects and manage containers etc. Baseline: Right: 47.5 lbs; Left 42.4 lbs Goal status: IN Progress   5.  Pt will independently recall sleep positioning options as noted in pt instructions to improve sleep disturbances from moderate to minimal.  Baseline:  mod difficulty per QuickDash Goal status: INITIAL     LONG TERM GOALS: Target date: 04/28/24   Patient will  demonstrate updated BUE HEP with visual instruction only for proper execution. Baseline: New to outpt OT Goal status: IN Progress - Tendon Gliding Exc issued at eval.   2.  Pt will independently recall at least 3 options for adaptive equipment to assist with joint protection, ergonomics, and body mechanic principles as noted in pt instructions for ADLs and IADLS.  Baseline:  New to outpt OT Goal status: IN Progress   3.  Pt will be independent with home based pain management routine to potentially include gloves/splints, heat and joint protection principles for minimal pain. Baseline: 8-9/10 Goal status: IN Progress   4.  Patient will demonstrate < 50% disability or less, indicating improved functional use of affected extremity.  Baseline: QuickDash 54.5% Goal status: IN Progress   ASSESSMENT:  CLINICAL IMPRESSION: Patient is a 89 y.o. female who was seen today for occupational therapy treatment for UE dysfunction.  Pt acknowledged understanding of education today and participated in discussion of ways to implement energy conservation and joint protection strategies into daily routine. Pt's family reported intent to likely order Paraffin wax home modality option. Pt will benefit from continued skilled OT services in the outpatient setting to work on impairments as noted at evaluation to help pt return to [facility] as able.    PERFORMANCE DEFICITS: in functional skills including ADLs, IADLs, coordination, dexterity, sensation, edema, ROM, strength, pain, flexibility, Fine motor control, Gross motor control, mobility, balance, body mechanics, endurance, decreased knowledge of precautions, decreased knowledge of use of DME, and UE functional use, cognitive skills including problem solving, safety awareness, and understand, and psychosocial skills including coping strategies, environmental adaptation, and  routines and behaviors.   IMPAIRMENTS: are limiting patient from ADLs, IADLs, rest and sleep,  and leisure.   CO-MORBIDITIES: may have co-morbidities  that affects occupational performance. Patient will benefit from skilled OT to address above impairments and improve overall function.  REHAB POTENTIAL: Good  PLAN:  OT FREQUENCY: 1-2x/week  OT DURATION: up to 6 weeks with 4 weeks scheduled at this time  PLANNED INTERVENTIONS: 97535 self care/ADL training, 29528 therapeutic exercise, 97530 therapeutic activity, 97112 neuromuscular re-education, 97140 manual therapy, 97035 ultrasound, 97018 paraffin, 41324 fluidotherapy, 97010 moist heat, balance training, visual/perceptual remediation/compensation, energy conservation, coping strategies training, patient/family education, and DME and/or AE instructions  RECOMMENDED OTHER SERVICES: N/A at this time  CONSULTED AND AGREED WITH PLAN OF CARE: Patient and family member/caregiver  PLAN FOR NEXT SESSION:  Review tendon gliding exercises, nerve glides, sensory education Assess wrist brace again PRN Modalities - fluido/US /paraffin Review joint protection during functional tasks Progress HEPs as able and as tolerated- elbow, shoulder ROM    Oakley Bellman, OT 03/31/2024, 1:54 PM

## 2024-03-31 NOTE — Patient Instructions (Addendum)
 [name]

## 2024-04-03 ENCOUNTER — Encounter: Admitting: Occupational Therapy

## 2024-04-05 ENCOUNTER — Ambulatory Visit: Payer: Self-pay | Admitting: Occupational Therapy

## 2024-04-05 DIAGNOSIS — R29818 Other symptoms and signs involving the nervous system: Secondary | ICD-10-CM

## 2024-04-05 DIAGNOSIS — M79641 Pain in right hand: Secondary | ICD-10-CM

## 2024-04-05 DIAGNOSIS — R208 Other disturbances of skin sensation: Secondary | ICD-10-CM

## 2024-04-05 DIAGNOSIS — R278 Other lack of coordination: Secondary | ICD-10-CM

## 2024-04-05 DIAGNOSIS — M6281 Muscle weakness (generalized): Secondary | ICD-10-CM
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
## 2024-04-06 ENCOUNTER — Ambulatory Visit: Admitting: Occupational Therapy

## 2024-04-06 DIAGNOSIS — R278 Other lack of coordination: Secondary | ICD-10-CM

## 2024-04-06 DIAGNOSIS — M6281 Muscle weakness (generalized): Secondary | ICD-10-CM

## 2024-04-06 DIAGNOSIS — R29818 Other symptoms and signs involving the nervous system: Secondary | ICD-10-CM

## 2024-04-06 DIAGNOSIS — M79641 Pain in right hand: Secondary | ICD-10-CM | POA: Diagnosis not present

## 2024-04-06 DIAGNOSIS — R208 Other disturbances of skin sensation: Secondary | ICD-10-CM

## 2024-04-06 NOTE — Therapy (Signed)
 OUTPATIENT OCCUPATIONAL THERAPY NEURO TREATMENT  Patient Name: [name] MRN: [id number] DOB:[birth year], 89 y.o., female Today's Date: 04/06/2024  PCP: Monica Arnold, PA-C REFERRING PROVIDER: Omega Bible, MD  END OF SESSION:  OT End of Session - 04/06/24 1603     Visit Number 7    Number of Visits 12    Authorization Type [employer] Adv 2025 $15 copay    Authorization Time Period Met VL: [place] Auth Not Reqd    Progress Note Due on Visit 10    OT Start Time 0806    OT Stop Time 0846    OT Time Calculation (min) 40 min    Activity Tolerance Patient tolerated treatment well    Behavior During Therapy WFL for tasks assessed/performed                 Past Medical History:  Diagnosis Date   Anemia    low iron   Chronic renal insufficiency, stage III (moderate) (HCC)    DDD (degenerative disc disease), lumbar    Diverticulosis    Dizziness    Dyspnea    thinks it's due to the back pain   Factor 5 Leiden mutation, heterozygous (HCC)    GERD (gastroesophageal reflux disease)    Herpes simplex infection    Hiatal hernia    History of anemia    History of atrial fibrillation    HTN (hypertension)    IBS (irritable bowel syndrome)    Mixed hyperlipidemia    Osteoarthritis    Osteopenia    PONV (postoperative nausea and vomiting)    Pre-diabetes    Prediabetes    SOB (shortness of breath) on exertion    Spinal stenosis of lumbar region at multiple levels    Past Surgical History:  Procedure Laterality Date   COLONOSCOPY     DENTAL SURGERY     EYE SURGERY Bilateral    cataract surgery with lens implants   KIDNEY SURGERY Left 01/03/2009   left partial nephrectomy (pathology: leiomyoma)   LUMBAR LAMINECTOMY/DECOMPRESSION MICRODISCECTOMY N/A 09/20/2020   Procedure: LUMBAR LAMINECTOMY/DECOMPRESSION MICRODISCECTOMY LUMBAR TWO TO LUMBAR FIVE;  Surgeon: Monica Bleacher, MD;  Location: [facility] OR;  Service: Neurosurgery;  Laterality: N/A;   TOTAL KNEE ARTHROPLASTY  Left    TOTAL KNEE ARTHROPLASTY Right    Patient Active Problem List   Diagnosis Date Noted   Community acquired pneumonia 09/27/2023   Pneumonia 09/27/2023   Family history of factor V Leiden mutation 10/13/2021   Lumbar stenosis with neurogenic claudication 09/20/2020   Chest pain 05/24/2020   Pulmonary nodule 05/24/2020   HLD (hyperlipidemia) 05/24/2020   Chest pain, atypical 02/23/2017   Disequilibrium 02/23/2017   Essential hypertension 02/23/2017   Dizziness 02/12/2017   Chronic headaches 02/12/2017   Lightheadedness 02/12/2017   Chest tightness 02/12/2017   Shoulder pain 02/12/2017   Shortness of breath 02/12/2017    ONSET DATE: 02/18/2024  REFERRING DIAG: G62.9 (ICD-10-CM) - Neuropathy Eval and treat bilateral hand numbness  THERAPY DIAG:  Pain in right hand  Other disturbances of skin sensation  Other symptoms and signs involving the nervous system  Muscle weakness (generalized)  Other lack of coordination  Rationale for Evaluation and Treatment: Rehabilitation  SUBJECTIVE:   SUBJECTIVE STATEMENT:  Pt reported hands a bit sore today. Pt reported completing exercises though has not yet practiced exercises provided at yesterday's OT appointment. Pt's daughter reported pt has not yet attempted to drive since last OT visit.  Pt accompanied by: family member -  [name]  PERTINENT HISTORY:   PMHx: HTN (no longer on BP med per pt), headaches, L shoulder pain, lumbar stenosis,   MD note/PLAN 02/18/24:  INTERMITTENT NUMBNESS IN FINGERTIPS / TOES (since ~2021; worse in last few months) - check neuropathy labs (eval for other systemic causes of neuropathy) - will request MRI cervical spine results (however, patient not likely surgical candidate) - avoid compression at elbows / wrists; if patient wants to pursue carpal tunnel syndrome surgery, then follow up with orthopedic clinic for testing and treatment (although symptoms are more widespread than localized  compression neuropathies) - WEAR WRIST SPLINT AT BEDTIME  -Return for pending test results, pending if symptoms worsen or fail to improve.  02/29/24 - MRI report from [facility] - Multilevel degenerative changes noted at C4-5, C5-6 and C6-7 with mild spinal stenosis(narrowing). These findings could contribute to numbness in the hands. Dr. Salli Hawkins recommends she f/u with orthopedic clinic if you would like to pursue surgery or epidural steroid injections.   02/22/24 - Radiographs taken this day demonstrate what appears to be a history of small tears at the insertion of the peroneus brevis tendon and at the base of the fifth metatarsal.  There does appear to be a small fracture nondisplaced transversely on 1 view of the radiograph.  Plan: Placed her in a short cam boot and I will follow-up with her in 1 month  PRECAUTIONS: Fall and Other: Cam boot RLE  WEIGHT BEARING RESTRICTIONS: Yes WBAT in CAM boot at this time  PAIN: N/A upon arrival  Are you having pain? At worst Yes: NPRS scale: 4.5/10 Pain location: hand joints, base of R thumb Pain description: deep aching Aggravating factors: writing, sewing Relieving factors: stop using hands, Tylenol , Volatren on hands up to 4x/day but doesn't use it regular  FALLS: Has patient fallen in last 6 months? Yes. Number of falls 1 but > 6 months ago  LIVING ENVIRONMENT: Lives with: lives alone, daughter [name] lives behind her and is in/out of the house, she is present for AM/PM ADLS to make sure she doesn't fall, may help her get clothes over head, off arm etc Lives in: House Stairs: No Has following equipment at home: Single point cane, Environmental consultant - 2 wheeled, Wheelchair (manual), Shower bench, Grab bars, and walk-in shower and widened doors  PLOF: Independent with household mobility with device, Needs assistance with ADLs, and Needs assistance with homemaking   PATIENT GOALS: decreased numbness and pain in hands so she can do her daily  activities  OBJECTIVE:  Note: Objective measures were completed at Evaluation unless otherwise noted.  HAND DOMINANCE: Right  ADLs: Overall ADLs: Mod Ind to [employer] Transfers/ambulation related to ADLs: Supervision in/out of the bed  Eating: Some help if things are hard to cut Grooming: Help as needed based on pain/swelling and numbness UB Dressing: Often help to pull bra down in the back - using pull over bra LB Dressing: Some trouble with R foot - ie) help with sock/shoe Toileting: Ind Bathing: Showers herself Tub Shower transfers: Mod Ind to supervision (esp with CAM boot at this time) Equipment: Grab bars, Walk in shower, and built in seat  IADLs:   Shopping: Assistance from daughter Light housekeeping: Some self performance/some family assist Meal Prep: Some self performance/some family assist Community mobility: Supervision with UP walker Medication management: TBA Financial management: TBA Handwriting: Not assessed but pt is part of a Christmas card program and often writes 50-100 Christmas cards at a time - This can  cause pain.  She does like a pen with gripper.  MOBILITY STATUS: Needs Assist: UP walker and Hx of falls - family reports walking 1 mile a couple of times/week  POSTURE COMMENTS:  rounded shoulders and forward head Sitting balance: WFL  ACTIVITY TOLERANCE: Activity tolerance: Good  FUNCTIONAL OUTCOME MEASURES: Quick Dash: 54.5    04/06/24 - 40.9% deficit    UPPER EXTREMITY ROM:    Active ROM Right eval Left eval  Shoulder flexion 145 H/o of sh lim ~ 120  Shoulder abduction limited limited  Shoulder adduction    Shoulder extension    Shoulder internal rotation    Shoulder external rotation    Elbow flexion [facility] WFL  Elbow extension    Wrist flexion limited limited  Wrist extension    Wrist ulnar deviation    Wrist radial deviation    Wrist pronation    Wrist supination    (Blank rows = not tested)  UPPER EXTREMITY MMT:     MMT  Right eval Left eval  Shoulder flexion 3- 3-  Shoulder abduction    Shoulder adduction    Shoulder extension    Shoulder internal rotation    Shoulder external rotation    Middle trapezius    Lower trapezius    Elbow flexion 3 3  Elbow extension 3 3  Wrist flexion    Wrist extension    Wrist ulnar deviation    Wrist radial deviation    Wrist pronation    Wrist supination    (Blank rows = not tested)  HAND FUNCTION: Grip strength: Right: 46.5, 48.5 lbs; Left: 42.3, 42.5 lbs Hurts in the right thumb Average: Right: 47.5 lbs; Left 42.4 lbs  COORDINATION: 9 Hole Peg test: Right: 25.67 sec; Left: 24.51 sec  SENSATION: Numbness INTERMITTENT NUMBNESS IN FINGERTIPS / TOES (since ~2021; worse in last few months)  EDEMA: hand joints swell  MUSCLE TONE: WFL  COGNITION: Overall cognitive status: Within functional limits for tasks assessed  VISION: Subjective report: Likes things printed larger; does use magnify glass at times Baseline vision: Bifocals Visual history: TBA  OBSERVATIONS: Pt ambulates with UP walker and no loss of balance. The pt is well kept and has CAM boot donned on RLE.                                                                                                                            TREATMENT DATE:  Self Care: Paraffin wax BUE - 15 minutes - to reduce stiffness, to reduce pain. During and after paraffin wax application, OT reviewed joint protection education and educated on A/E options (see below). Pt and family acknowledged understanding of all.  -Door knobs - Pt reported difficulty with turning round door knobs in-home. OT recommended to use lever door knobs instead of round door knobs if possible to replace, A/E option of silicone non-slip doorknob grip or doorknob adapter and showed examples online. (See pt instructions).  -Long handled sponge  to improve ind and efficiency for ADL bathing task.  Therapeutic Exercises Reviewed ulnar nerve glides  HEP provided at previous session. Pt benefited from mod to max v/c and therapist modeling to return demo. Pt may benefit from additional review. OT removed "raccoon mask" ulnar nerve glide exercise from HEP handout d/t pt demo'ing difficulty with BUE ROM.   TherAct OT assessed pt's progress towards goals, see below for updates.    PATIENT EDUCATION: Education details: see today's tx above Person educated: Patient and Child(ren) Education method: Explanation, Demonstration, Tactile cues, and Verbal cues Education comprehension: verbalized understanding, returned demonstration, verbal cues required, tactile cues required, and needs further education  HOME EXERCISE PROGRAM: 03/16/24: Tendon gliding Exercises 03/21/24 - sleep positioning, joint protection 03/24/24 - joint protection (see pt instructions) 03/30/24 - Cervical traction Access Code: [email or web address] 03/31/24 - sensory safety precautions, energy conservation and deep breathing (see pt instructions)  GOALS: Goals reviewed with patient? Yes    SHORT TERM GOALS: Target date: 04/07/14   Patient will demonstrate initial BUE HEP with 25% verbal cues or less for proper execution. Baseline: New to outpt OT Goal status: IN Progress - Tendon Gliding Exc issued at eval.   2.  Pt to trial prefab hand splints to help improve numbness and improve overall comfort for functional use of UEs, nighttime support and pain management .  Baseline: Pt has splint at home but hasn't found it to be comfortable - encouraged to bring it to next appt 04/06/24 - Pt reported splints are still a little uncomfortable. "It's like they need a softer lining on the inside." Goal status: IN Progress   3.  Pt will independently recall at least 3 joint protection, ergonomics, and body mechanic principles as noted in pt instructions to assist with daily tasks with increased comfort and confidence.  Baseline:  New to outpt OT Goal status: 04/05/24 MET and onoing   4.  Patient will  demonstrate no loss of grip strength and/or improvement as needed to minimize dropping objects and manage containers etc. Baseline: Right: 47.5 lbs; Left 42.4 lbs  Goal status: IN Progress   5.  Pt will independently recall sleep positioning options as noted in pt instructions to improve sleep disturbances from moderate to minimal.  Baseline:  mod difficulty per [facility] 04/06/24 - Pt reported sleep positioning have improved. Pt reported continued "moderate difficulty." Pt reported improved sleep quality for the last couple of nights. Goal status: IN Progress     LONG TERM GOALS: Target date: 04/28/24   Patient will demonstrate updated BUE HEP with visual instruction only for proper execution. Baseline: New to outpt OT Goal status: IN Progress - Tendon Gliding Exc issued at eval.   2.  Pt will independently recall at least 3 options for adaptive equipment to assist with joint protection, ergonomics, and body mechanic principles as noted in pt instructions for ADLs and IADLS.  Baseline:  New to outpt OT 04/06/24 - Pt reported using jar openers and built-up handles for eating utensils, and using tall coffee cup to allow pt to hold item with 2 hands, mixing cupcakes using full arm motions to avoid wrist strain, plastic glass in the bathroom, and more frequently used items on lower shelves.  Goal status: MET and ongoing   3.  Pt will be independent with home based pain management routine to potentially include gloves/splints, heat and joint protection principles for minimal pain. Baseline: 8-9/10 Goal status: IN Progress   4.  Patient will demonstrate < 50% disability  or less, indicating improved functional use of affected extremity.  Baseline: QuickDash 54.5% 04/06/24- 40.9% deficit Goal status: IN Progress   ASSESSMENT:  CLINICAL IMPRESSION: Patient is a 89 y.o. female who was seen today for occupational therapy treatment for UE dysfunction. Pt continues to benefit from paraffin wax  application to reduce pain/discomfort symptoms of BUE. Pt returned demo of nerve glides HEP issued at previous session though required significant cueing and therapist modeling. Recommended to continue to review PRN. Pt and family acknowledged understanding of A/E options to improve ind and reduce discomfort when completing functional tasks at home. Pt and family reporting good carryover of A/E options and adaptive strategies recommended at previous OT sessions. Pt will benefit from continued skilled OT services in the outpatient setting to work on impairments as noted at evaluation to help pt return to [facility] as able.    PERFORMANCE DEFICITS: in functional skills including ADLs, IADLs, coordination, dexterity, sensation, edema, ROM, strength, pain, flexibility, Fine motor control, Gross motor control, mobility, balance, body mechanics, endurance, decreased knowledge of precautions, decreased knowledge of use of DME, and UE functional use, cognitive skills including problem solving, safety awareness, and understand, and psychosocial skills including coping strategies, environmental adaptation, and routines and behaviors.   IMPAIRMENTS: are limiting patient from ADLs, IADLs, rest and sleep, and leisure.   CO-MORBIDITIES: may have co-morbidities  that affects occupational performance. Patient will benefit from skilled OT to address above impairments and improve overall function.  REHAB POTENTIAL: Good  PLAN:  OT FREQUENCY: 1-2x/week  OT DURATION: up to 6 weeks with 4 weeks scheduled at this time  PLANNED INTERVENTIONS: 97535 self care/ADL training, 29937 therapeutic exercise, 97530 therapeutic activity, 97112 neuromuscular re-education, 97140 manual therapy, 97035 ultrasound, 97018 paraffin, 16967 fluidotherapy, 97010 moist heat, balance training, visual/perceptual remediation/compensation, energy conservation, coping strategies training, patient/family education, and DME and/or AE  instructions  RECOMMENDED OTHER SERVICES: N/A at this time  CONSULTED AND AGREED WITH PLAN OF CARE: Patient and family member/caregiver  PLAN FOR NEXT SESSION:  Review tendon gliding exercises, nerve glides, sensory education Modalities - fluido/US /paraffin Review joint protection during functional tasks Progress HEPs as able and as tolerated- elbow, shoulder ROM  Note: Pt reported splints still a little uncomfortable. Pt to bring splints next visit to assess if splints can be more comfortable with addition of a lining or other splint recommendations.   Proprioception bin and stereognosis    Oakley Bellman, OT 04/06/2024, 4:14 PM

## 2024-04-06 NOTE — Patient Instructions (Addendum)
 [name]

## 2024-04-11 ENCOUNTER — Ambulatory Visit: Admitting: Occupational Therapy

## 2024-04-11 DIAGNOSIS — M6281 Muscle weakness (generalized): Secondary | ICD-10-CM

## 2024-04-11 DIAGNOSIS — R208 Other disturbances of skin sensation: Secondary | ICD-10-CM

## 2024-04-11 DIAGNOSIS — M79641 Pain in right hand: Secondary | ICD-10-CM

## 2024-04-11 DIAGNOSIS — R278 Other lack of coordination: Secondary | ICD-10-CM

## 2024-04-11 DIAGNOSIS — R29818 Other symptoms and signs involving the nervous system: Secondary | ICD-10-CM

## 2024-04-11 NOTE — Therapy (Signed)
 OUTPATIENT OCCUPATIONAL THERAPY NEURO TREATMENT  Patient Name: [name] MRN: [id number] DOB:1931/05/28, 89 y.o., female Today's Date: 04/11/2024  PCP: Monica Arnold, PA-C REFERRING PROVIDER: Omega Bible, MD  END OF SESSION:  OT End of Session - 04/11/24 0936     Visit Number 8    Number of Visits 12    Authorization Type [employer] Adv 2025 $15 copay    Authorization Time Period Met VL: [place] Auth Not Reqd    Progress Note Due on Visit 10    OT Start Time 0934    OT Stop Time 1015    OT Time Calculation (min) 41 min    Activity Tolerance Patient tolerated treatment well    Behavior During Therapy WFL for tasks assessed/performed                 Past Medical History:  Diagnosis Date   Anemia    low iron   Chronic renal insufficiency, stage III (moderate) (HCC)    DDD (degenerative disc disease), lumbar    Diverticulosis    Dizziness    Dyspnea    thinks it's due to the back pain   Factor 5 Leiden mutation, heterozygous (HCC)    GERD (gastroesophageal reflux disease)    Herpes simplex infection    Hiatal hernia    History of anemia    History of atrial fibrillation    HTN (hypertension)    IBS (irritable bowel syndrome)    Mixed hyperlipidemia    Osteoarthritis    Osteopenia    PONV (postoperative nausea and vomiting)    Pre-diabetes    Prediabetes    SOB (shortness of breath) on exertion    Spinal stenosis of lumbar region at multiple levels    Past Surgical History:  Procedure Laterality Date   COLONOSCOPY     DENTAL SURGERY     EYE SURGERY Bilateral    cataract surgery with lens implants   KIDNEY SURGERY Left 01/03/2009   left partial nephrectomy (pathology: leiomyoma)   LUMBAR LAMINECTOMY/DECOMPRESSION MICRODISCECTOMY N/A 09/20/2020   Procedure: LUMBAR LAMINECTOMY/DECOMPRESSION MICRODISCECTOMY LUMBAR TWO TO LUMBAR FIVE;  Surgeon: Monica Bleacher, MD;  Location: [facility] OR;  Service: Neurosurgery;  Laterality: N/A;   TOTAL KNEE ARTHROPLASTY  Left    TOTAL KNEE ARTHROPLASTY Right    Patient Active Problem List   Diagnosis Date Noted   Community acquired pneumonia 09/27/2023   Pneumonia 09/27/2023   Family history of factor V Leiden mutation 10/13/2021   Lumbar stenosis with neurogenic claudication 09/20/2020   Chest pain 05/24/2020   Pulmonary nodule 05/24/2020   HLD (hyperlipidemia) 05/24/2020   Chest pain, atypical 02/23/2017   Disequilibrium 02/23/2017   Essential hypertension 02/23/2017   Dizziness 02/12/2017   Chronic headaches 02/12/2017   Lightheadedness 02/12/2017   Chest tightness 02/12/2017   Shoulder pain 02/12/2017   Shortness of breath 02/12/2017    ONSET DATE: 02/18/2024  REFERRING DIAG: G62.9 (ICD-10-CM) - Neuropathy Eval and treat bilateral hand numbness  THERAPY DIAG:  Pain in right hand  Muscle weakness (generalized)  Other lack of coordination  Other disturbances of skin sensation  Other symptoms and signs involving the nervous system  Rationale for Evaluation and Treatment: Rehabilitation  SUBJECTIVE:   SUBJECTIVE STATEMENT:  Pt brought her hand splint for her L hand today but reports the one she has at home for the R hand is too small.  Pt accompanied by: family member - daughter [name]  PERTINENT HISTORY:   PMHx: HTN (no longer  on BP med per pt), headaches, L shoulder pain, lumbar stenosis,   MD note/PLAN 02/18/24:  INTERMITTENT NUMBNESS IN FINGERTIPS / TOES (since ~2021; worse in last few months) - check neuropathy labs (eval for other systemic causes of neuropathy) - will request MRI cervical spine results (however, patient not likely surgical candidate) - avoid compression at elbows / wrists; if patient wants to pursue carpal tunnel syndrome surgery, then follow up with orthopedic clinic for testing and treatment (although symptoms are more widespread than localized compression neuropathies) - WEAR WRIST SPLINT AT BEDTIME  -Return for pending test results, pending if  symptoms worsen or fail to improve.  02/29/24 - MRI report from [facility] - Multilevel degenerative changes noted at C4-5, C5-6 and C6-7 with mild spinal stenosis(narrowing). These findings could contribute to numbness in the hands. Dr. Salli Hawkins recommends she f/u with orthopedic clinic if you would like to pursue surgery or epidural steroid injections.   02/22/24 - Radiographs taken this day demonstrate what appears to be a history of small tears at the insertion of the peroneus brevis tendon and at the base of the fifth metatarsal.  There does appear to be a small fracture nondisplaced transversely on 1 view of the radiograph.  Plan: Placed her in a short cam boot and I will follow-up with her in 1 month  PRECAUTIONS: Fall and Other: Cam boot RLE  WEIGHT BEARING RESTRICTIONS: Yes WBAT in CAM boot at this time  PAIN: N/A upon arrival  Are you having pain? At worst Yes: NPRS scale: 4/10 Pain location: hand joints, base of R thumb Pain description: deep aching Aggravating factors: writing, sewing Relieving factors: stop using hands, Tylenol , Volatren on hands up to 4x/day but doesn't use it regular  FALLS: Has patient fallen in last 6 months? Yes. Number of falls 1 but > 6 months ago  LIVING ENVIRONMENT: Lives with: lives alone, daughter [name] lives behind her and is in/out of the house, she is present for AM/PM ADLS to make sure she doesn't fall, may help her get clothes over head, off arm etc Lives in: House Stairs: No Has following equipment at home: Single point cane, Environmental consultant - 2 wheeled, Wheelchair (manual), Shower bench, Grab bars, and walk-in shower and widened doors  PLOF: Independent with household mobility with device, Needs assistance with ADLs, and Needs assistance with homemaking   PATIENT GOALS: decreased numbness and pain in hands so she can do her daily activities  OBJECTIVE:  Note: Objective measures were completed at Evaluation unless otherwise noted.  HAND  DOMINANCE: Right  ADLs: Overall ADLs: Mod Ind to [employer] Transfers/ambulation related to ADLs: Supervision in/out of the bed  Eating: Some help if things are hard to cut Grooming: Help as needed based on pain/swelling and numbness UB Dressing: Often help to pull bra down in the back - using pull over bra LB Dressing: Some trouble with R foot - ie) help with sock/shoe Toileting: Ind Bathing: Showers herself Tub Shower transfers: Mod Ind to supervision (esp with CAM boot at this time) Equipment: Grab bars, Walk in shower, and built in seat  IADLs:   Shopping: Assistance from daughter Light housekeeping: Some self performance/some family assist Meal Prep: Some self performance/some family assist Community mobility: Supervision with UP walker Medication management: TBA Financial management: TBA Handwriting: Not assessed but pt is part of a Christmas card program and often writes 50-100 Christmas cards at a time - This can cause pain.  She does like a pen with gripper.  MOBILITY STATUS: Needs Assist: UP walker and Hx of falls - family reports walking 1 mile a couple of times/week  POSTURE COMMENTS:  rounded shoulders and forward head Sitting balance: WFL  ACTIVITY TOLERANCE: Activity tolerance: Good  FUNCTIONAL OUTCOME MEASURES: Quick Dash: 54.5    04/06/24 - 40.9% deficit    UPPER EXTREMITY ROM:    Active ROM Right eval Left eval  Shoulder flexion 145 H/o of sh lim ~ 120  Shoulder abduction limited limited  Shoulder adduction    Shoulder extension    Shoulder internal rotation    Shoulder external rotation    Elbow flexion [facility] WFL  Elbow extension    Wrist flexion limited limited  Wrist extension    Wrist ulnar deviation    Wrist radial deviation    Wrist pronation    Wrist supination    (Blank rows = not tested)  UPPER EXTREMITY MMT:     MMT Right eval Left eval  Shoulder flexion 3- 3-  Shoulder abduction    Shoulder adduction    Shoulder extension     Shoulder internal rotation    Shoulder external rotation    Middle trapezius    Lower trapezius    Elbow flexion 3 3  Elbow extension 3 3  Wrist flexion    Wrist extension    Wrist ulnar deviation    Wrist radial deviation    Wrist pronation    Wrist supination    (Blank rows = not tested)  HAND FUNCTION: Grip strength: Right: 46.5, 48.5 lbs; Left: 42.3, 42.5 lbs Hurts in the right thumb Average: Right: 47.5 lbs; Left 42.4 lbs  COORDINATION: 9 Hole Peg test: Right: 25.67 sec; Left: 24.51 sec  SENSATION: Numbness INTERMITTENT NUMBNESS IN FINGERTIPS / TOES (since ~2021; worse in last few months)  EDEMA: hand joints swell  MUSCLE TONE: WFL  COGNITION: Overall cognitive status: Within functional limits for tasks assessed  VISION: Subjective report: Likes things printed larger; does use magnify glass at times Baseline vision: Bifocals Visual history: TBA  OBSERVATIONS: Pt ambulates with UP walker and no loss of balance. The pt is well kept and has CAM boot donned on RLE.                                                                                                                            TREATMENT DATE:  Self Care: Reviewed Joint protection ideas r/t sewing including:  -Modifying Techniques as able - ie) using sewing clips rather than pins to hold materials together -Take breaks: Avoid prolonged periods of continuous sewing and take short breaks to stretch and relax your hands.  -Use a needle threader: This can reduce strain on your fingers when threading needles.  -Use ergonomic scissors: Scissors with spring-loaded handles can reduce pressure on your thumb.  -Compression gloves: Can provide support and warmth to your hands, especially if you have arthritis. (Pt reports using these at times) -Warm-up your hands: Soaking your hands  in warm water OR paraffin  before/after sewing can help loosen stiff joints.  -Stretch regularly: Perform gentle stretches to improve  circulation and reduce stiffness.   Orthotic Management:  Pt brought her wrist splint from home for LUE and OTR removed metal stay with increased comfort reported.  Pt trialled a Comfort Cool thumb spica splint today with good comfort noted and good support without any hard aspects of splint noted.  Options located on [employer] for Lg splint as trial splint was XL and slightly too big for her.  Therapeutic Activities:  Facilitated pinch strengthening with use of therapy resistant clothespins to target pinch of R hand with thumb spica splint applied.  Able to pinch yellow, red (light resistance) with good tolerance to clip around edge of package to simulate fabric as well as to remove them.  Pt also engaged in writing with splint applied and reports increased comfort with soft spica splint ie) she doesn't hold the pen so tight and her writing is more relaxed.  She is encouraged to write bigger ie) blank side of Christmas cards for max comfort.   PATIENT EDUCATION: Education details: Joint protection and splint Person educated: Patient and Child(ren) Education method: Explanation, Demonstration, Tactile cues, and Verbal cues Education comprehension: verbalized understanding, returned demonstration, verbal cues required, tactile cues required, and needs further education  HOME EXERCISE PROGRAM: 03/16/24: Tendon gliding Exercises 03/21/24 - sleep positioning, joint protection 03/24/24 - joint protection (see pt instructions) 03/30/24 - Cervical traction Access Code: [email or web address] 03/31/24 - sensory safety precautions, energy conservation and deep breathing (see pt instructions)  GOALS: Goals reviewed with patient? Yes    SHORT TERM GOALS: Target date: 04/07/14   Patient will demonstrate initial BUE HEP with 25% verbal cues or less for proper execution. Baseline: New to outpt OT Goal status: MET Tendon Gliding Exc issued at eval.   2.  Pt to trial prefab hand splints to help improve numbness and improve  overall comfort for functional use of UEs, nighttime support and pain management .  Baseline: Pt has splint at home but hasn't found it to be comfortable - encouraged to bring it to next appt 04/06/24 - Pt reported splints are still a little uncomfortable. "It's like they need a softer lining on the inside." Goal status: IN Progress   3.  Pt will independently recall at least 3 joint protection, ergonomics, and body mechanic principles as noted in pt instructions to assist with daily tasks with increased comfort and confidence.  Baseline:  New to outpt OT Goal status: 04/05/24 MET and onoing   4.  Patient will demonstrate no loss of grip strength and/or improvement as needed to minimize dropping objects and manage containers etc. Baseline: Right: 47.5 lbs; Left 42.4 lbs  Goal status: IN Progress   5.  Pt will independently recall sleep positioning options as noted in pt instructions to improve sleep disturbances from moderate to minimal.  Baseline:  mod difficulty per [facility] 04/06/24 - Pt reported sleep positioning have improved. Pt reported continued "moderate difficulty." Pt reported improved sleep quality for the last couple of nights. Goal status: IN Progress     LONG TERM GOALS: Target date: 04/28/24   Patient will demonstrate updated BUE HEP with visual instruction only for proper execution. Baseline: New to outpt OT Goal status: IN Progress - Tendon Gliding Exc issued at eval.   2.  Pt will independently recall at least 3 options for adaptive equipment to assist with joint protection, ergonomics, and Optometrist principles  as noted in pt instructions for ADLs and IADLS.  Baseline:  New to outpt OT 04/06/24 - Pt reported using jar openers and built-up handles for eating utensils, and using tall coffee cup to allow pt to hold item with 2 hands, mixing cupcakes using full arm motions to avoid wrist strain, plastic glass in the bathroom, and more frequently used items on lower shelves.   Goal status: MET and ongoing   3.  Pt will be independent with home based pain management routine to potentially include gloves/splints, heat and joint protection principles for minimal pain. Baseline: 8-9/10 Goal status: IN Progress   4.  Patient will demonstrate < 50% disability or less, indicating improved functional use of affected extremity.  Baseline: QuickDash 54.5% 04/06/24- 40.9% deficit Goal status: IN Progress   ASSESSMENT:  CLINICAL IMPRESSION: Patient is a 89 y.o. female who was seen today for occupational therapy treatment for UE dysfunction. Pt continues to benefit from joint protection education and splint management/trials today. Pt and family acknowledged understanding of A/E options to improve ind and reduce discomfort soft splint applied today. Pt and family reporting good carryover of A/E options and adaptive strategies recommended at previous OT sessions. Pt will benefit from continued skilled OT services in the outpatient setting to work on impairments as noted at evaluation to help pt return to [facility] as able.    PERFORMANCE DEFICITS: in functional skills including ADLs, IADLs, coordination, dexterity, sensation, edema, ROM, strength, pain, flexibility, Fine motor control, Gross motor control, mobility, balance, body mechanics, endurance, decreased knowledge of precautions, decreased knowledge of use of DME, and UE functional use, cognitive skills including problem solving, safety awareness, and understand, and psychosocial skills including coping strategies, environmental adaptation, and routines and behaviors.   IMPAIRMENTS: are limiting patient from ADLs, IADLs, rest and sleep, and leisure.   CO-MORBIDITIES: may have co-morbidities  that affects occupational performance. Patient will benefit from skilled OT to address above impairments and improve overall function.  REHAB POTENTIAL: Good  PLAN:  OT FREQUENCY: 1-2x/week  OT DURATION: up to 6 weeks with 4 weeks  scheduled at this time  PLANNED INTERVENTIONS: 97535 self care/ADL training, 16109 therapeutic exercise, 97530 therapeutic activity, 97112 neuromuscular re-education, 97140 manual therapy, 97035 ultrasound, 97018 paraffin, 60454 fluidotherapy, 97010 moist heat, balance training, visual/perceptual remediation/compensation, energy conservation, coping strategies training, patient/family education, and DME and/or AE instructions  RECOMMENDED OTHER SERVICES: N/A at this time  CONSULTED AND AGREED WITH PLAN OF CARE: Patient and family member/caregiver  PLAN FOR NEXT SESSION:  Review tendon gliding exercises, nerve glides, sensory education Modalities - fluido/US /paraffin Review joint protection during functional tasks Progress HEPs as able and as tolerated- elbow, shoulder ROM  Proprioception bin and stereognosis  Check on nerve glide positions, retest grip strength  OT visit with JB 5/21 8AM for DC(?)  Zora Hires, OT 04/11/2024, 7:23 PM

## 2024-04-12 DIAGNOSIS — H40013 Open angle with borderline findings, low risk, bilateral: Secondary | ICD-10-CM | POA: Diagnosis not present

## 2024-04-13 ENCOUNTER — Ambulatory Visit: Admitting: Occupational Therapy

## 2024-04-13 DIAGNOSIS — M6281 Muscle weakness (generalized): Secondary | ICD-10-CM

## 2024-04-13 DIAGNOSIS — R208 Other disturbances of skin sensation: Secondary | ICD-10-CM

## 2024-04-13 DIAGNOSIS — M79641 Pain in right hand: Secondary | ICD-10-CM | POA: Diagnosis not present

## 2024-04-13 DIAGNOSIS — R29818 Other symptoms and signs involving the nervous system: Secondary | ICD-10-CM

## 2024-04-13 DIAGNOSIS — R278 Other lack of coordination: Secondary | ICD-10-CM

## 2024-04-13 NOTE — Therapy (Signed)
 OUTPATIENT OCCUPATIONAL THERAPY NEURO TREATMENT  Patient Name: [name] MRN: [id number] DOB:04/02/1931, 89 y.o., female Today's Date: 04/13/2024  PCP: Monica Arnold, PA-C REFERRING PROVIDER: Omega Bible, MD  OCCUPATIONAL THERAPY DISCHARGE SUMMARY  Visits from Start of Care: 9  Current functional level related to goals / functional outcomes: Pt has met 4 out of 5 STG and 4 out of 4 LTG to satisfactory levels and is pleased with outcomes.   Remaining deficits: Pt has minimal remaining functional deficits or pain. Pt demo'd good carryover of education. OT recommended to continue to complete HEP and pt verbalized understanding.   Education / Equipment: Pt has all needed materials and education. Pt understands how to continue on with self-management. See tx notes for more details.    Patient goals were met. Patient is being discharged due to meeting the stated rehab goals and pt is pleased with current functional level. Pt agreeable to D/C. Pt confirmed no additional questions/concerns related to OT at this time.     END OF SESSION:  OT End of Session - 04/13/24 0840     Visit Number 9    Number of Visits 12    Authorization Type [employer] 2025 $15 copay    Authorization Time Period Met VL: [place] Auth Not Reqd    Progress Note Due on Visit 10    OT Start Time 0806    OT Stop Time [phone]    OT Time Calculation (min) 32 min    Activity Tolerance Patient tolerated treatment well    Behavior During Therapy WFL for tasks assessed/performed                  Past Medical History:  Diagnosis Date   Anemia    low iron   Chronic renal insufficiency, stage III (moderate) (HCC)    DDD (degenerative disc disease), lumbar    Diverticulosis    Dizziness    Dyspnea    thinks it's due to the back pain   Factor 5 Leiden mutation, heterozygous (HCC)    GERD (gastroesophageal reflux disease)    Herpes simplex infection    Hiatal hernia    History of anemia     History of atrial fibrillation    HTN (hypertension)    IBS (irritable bowel syndrome)    Mixed hyperlipidemia    Osteoarthritis    Osteopenia    PONV (postoperative nausea and vomiting)    Pre-diabetes    Prediabetes    SOB (shortness of breath) on exertion    Spinal stenosis of lumbar region at multiple levels    Past Surgical History:  Procedure Laterality Date   COLONOSCOPY     DENTAL SURGERY     EYE SURGERY Bilateral    cataract surgery with lens implants   KIDNEY SURGERY Left 01/03/2009   left partial nephrectomy (pathology: leiomyoma)   LUMBAR LAMINECTOMY/DECOMPRESSION MICRODISCECTOMY N/A 09/20/2020   Procedure: LUMBAR LAMINECTOMY/DECOMPRESSION MICRODISCECTOMY LUMBAR TWO TO LUMBAR FIVE;  Surgeon: Monica Bleacher, MD;  Location: [facility] OR;  Service: Neurosurgery;  Laterality: N/A;   TOTAL KNEE ARTHROPLASTY Left    TOTAL KNEE ARTHROPLASTY Right    Patient Active Problem List   Diagnosis Date Noted   Community acquired pneumonia 09/27/2023   Pneumonia 09/27/2023   Family history of factor V Leiden mutation 10/13/2021   Lumbar stenosis with neurogenic claudication 09/20/2020   Chest pain 05/24/2020   Pulmonary nodule 05/24/2020   HLD (hyperlipidemia) 05/24/2020   Chest pain, atypical 02/23/2017  Disequilibrium 02/23/2017   Essential hypertension 02/23/2017   Dizziness 02/12/2017   Chronic headaches 02/12/2017   Lightheadedness 02/12/2017   Chest tightness 02/12/2017   Shoulder pain 02/12/2017   Shortness of breath 02/12/2017    ONSET DATE: 02/18/2024  REFERRING DIAG: G62.9 (ICD-10-CM) - Neuropathy Eval and treat bilateral hand numbness  THERAPY DIAG:  Pain in right hand  Muscle weakness (generalized)  Other lack of coordination  Other disturbances of skin sensation  Other symptoms and signs involving the nervous system  Rationale for Evaluation and Treatment: Rehabilitation  SUBJECTIVE:   SUBJECTIVE STATEMENT:  Pt reported splints are on the way. Pt  reported driving is going "much better" with reduced symptoms. Pt reported current splints working "much better" after removal of metal piece at last session.  Pt accompanied by: family member - daughter [name]  PERTINENT HISTORY:   PMHx: HTN (no longer on BP med per pt), headaches, L shoulder pain, lumbar stenosis,   MD note/PLAN 02/18/24:  INTERMITTENT NUMBNESS IN FINGERTIPS / TOES (since ~2021; worse in last few months) - check neuropathy labs (eval for other systemic causes of neuropathy) - will request MRI cervical spine results (however, patient not likely surgical candidate) - avoid compression at elbows / wrists; if patient wants to pursue carpal tunnel syndrome surgery, then follow up with orthopedic clinic for testing and treatment (although symptoms are more widespread than localized compression neuropathies) - WEAR WRIST SPLINT AT BEDTIME  -Return for pending test results, pending if symptoms worsen or fail to improve.  02/29/24 - MRI report from [facility] - Multilevel degenerative changes noted at C4-5, C5-6 and C6-7 with mild spinal stenosis(narrowing). These findings could contribute to numbness in the hands. [name] recommends she f/u with orthopedic clinic if you would like to pursue surgery or epidural steroid injections.   02/22/24 - Radiographs taken this day demonstrate what appears to be a history of small tears at the insertion of the peroneus brevis tendon and at the base of the fifth metatarsal.  There does appear to be a small fracture nondisplaced transversely on 1 view of the radiograph.  Plan: Placed her in a short cam boot and I will follow-up with her in 1 month  PRECAUTIONS: Fall and Other: Cam boot RLE  WEIGHT BEARING RESTRICTIONS: Yes WBAT in CAM boot at this time  PAIN: N/A upon arrival  Are you having pain? At worst Yes: NPRS scale: 3/10 Pain location: hand joints, base of R thumb Pain description: "they're good today" Aggravating factors: sewing,  using scissors Relieving factors: take breaks, Tylenol , Volatren on hands up to 4x/day but doesn't use it regular  FALLS: Has patient fallen in last 6 months? Yes. Number of falls 1 but > 6 months ago  LIVING ENVIRONMENT: Lives with: lives alone, daughter [name] lives behind her and is in/out of the house, she is present for AM/PM ADLS to make sure she doesn't fall, may help her get clothes over head, off arm etc Lives in: House Stairs: No Has following equipment at home: Single point cane, Environmental consultant - 2 wheeled, Wheelchair (manual), Shower bench, Grab bars, and walk-in shower and widened doors  PLOF: Independent with household mobility with device, Needs assistance with ADLs, and Needs assistance with homemaking   PATIENT GOALS: decreased numbness and pain in hands so she can do her daily activities  OBJECTIVE:  Note: Objective measures were completed at Evaluation unless otherwise noted.  HAND DOMINANCE: Right  ADLs: Overall ADLs: Mod Ind to [employer]  related to ADLs: Supervision in/out of the bed  Eating: Some help if things are hard to cut Grooming: Help as needed based on pain/swelling and numbness UB Dressing: Often help to pull bra down in the back - using pull over bra LB Dressing: Some trouble with R foot - ie) help with sock/shoe Toileting: Ind Bathing: Showers herself Tub Shower transfers: Mod Ind to supervision (esp with CAM boot at this time) Equipment: Grab bars, Walk in shower, and built in seat  IADLs:   Shopping: Assistance from daughter Light housekeeping: Some self performance/some family assist Meal Prep: Some self performance/some family assist Community mobility: Supervision with UP walker Medication management: TBA Financial management: TBA Handwriting: Not assessed but pt is part of a Christmas card program and often writes 50-100 Christmas cards at a time - This can cause pain.  She does like a pen with gripper.  MOBILITY STATUS:  Needs Assist: UP walker and Hx of falls - family reports walking 1 mile a couple of times/week  POSTURE COMMENTS:  rounded shoulders and forward head Sitting balance: WFL  ACTIVITY TOLERANCE: Activity tolerance: Good  FUNCTIONAL OUTCOME MEASURES: Quick Dash: 54.5    04/06/24 - 40.9% deficit   04/13/24 - 31.8% deficit   UPPER EXTREMITY ROM:    Active ROM Right eval Left eval  Shoulder flexion 145 H/o of sh lim ~ 120  Shoulder abduction limited limited  Shoulder adduction    Shoulder extension    Shoulder internal rotation    Shoulder external rotation    Elbow flexion [facility] WFL  Elbow extension    Wrist flexion limited limited  Wrist extension    Wrist ulnar deviation    Wrist radial deviation    Wrist pronation    Wrist supination    (Blank rows = not tested)  UPPER EXTREMITY MMT:     MMT Right eval Left eval  Shoulder flexion 3- 3-  Shoulder abduction    Shoulder adduction    Shoulder extension    Shoulder internal rotation    Shoulder external rotation    Middle trapezius    Lower trapezius    Elbow flexion 3 3  Elbow extension 3 3  Wrist flexion    Wrist extension    Wrist ulnar deviation    Wrist radial deviation    Wrist pronation    Wrist supination    (Blank rows = not tested)  HAND FUNCTION: Grip strength: Right: 46.5, 48.5 lbs; Left: 42.3, 42.5 lbs Hurts in the right thumb Average: Right: 47.5 lbs; Left 42.4 lbs  COORDINATION: 9 Hole Peg test: Right: 25.67 sec; Left: 24.51 sec  SENSATION: Numbness INTERMITTENT NUMBNESS IN FINGERTIPS / TOES (since ~2021; worse in last few months)  EDEMA: hand joints swell  MUSCLE TONE: WFL  COGNITION: Overall cognitive status: Within functional limits for tasks assessed  VISION: Subjective report: Likes things printed larger; does use magnify glass at times Baseline vision: Bifocals Visual history: TBA  OBSERVATIONS: Pt ambulates with UP walker and no loss of balance. The pt is well kept and  has CAM boot donned on RLE.  TREATMENT DATE:  Self Care: OT reviewed joint protection strategies, A/E options, body mechanics/ergonomic principles, energy conservation strategies. Pt acknowledged understanding and demo'd good carryover of education.  OT educated pt on electric scissors and adaptive scissors options for sewing tasks and showed examples online. Pt and family acknowledged understanding.   Therapeutic Activities:  Pt requested to D/C today and to end session slightly early d/t another medical appointment later today. OT assessed pt's progress towards goals, see below for updates. Pt demo'd great progress towards goals.   OT recommended to pt to continue to complete HEP. Pt verbalized understanding.   PATIENT EDUCATION: Education details: see today's tx Person educated: Patient and Child(ren) Education method: Explanation, Demonstration, Tactile cues, and Verbal cues Education comprehension: verbalized understanding, returned demonstration, verbal cues required, tactile cues required, and needs further education  HOME EXERCISE PROGRAM: 03/16/24: Tendon gliding Exercises 03/21/24 - sleep positioning, joint protection 03/24/24 - joint protection (see pt instructions) 03/30/24 - Cervical traction Access Code: [email or web address] 03/31/24 - sensory safety precautions, energy conservation and deep breathing (see pt instructions)  GOALS: Goals reviewed with patient? Yes    SHORT TERM GOALS: Target date: 04/07/14   Patient will demonstrate initial BUE HEP with 25% verbal cues or less for proper execution. Baseline: New to outpt OT Goal status: MET Tendon Gliding Exc issued at eval.   2.  Pt to trial prefab hand splints to help improve numbness and improve overall comfort for functional use of UEs, nighttime support and pain management .  Baseline: Pt has splint at home but  hasn't found it to be comfortable - encouraged to bring it to next appt 04/06/24 - Pt reported splints are still a little uncomfortable. "It's like they need a softer lining on the inside." 04/13/24 - Pt verbalized understanding of splint wear/care schedule. Pt reported new over-the-counter splints are ordered and on the way. Pt reported no questions/concerns related to splints. Goal status: MET   3.  Pt will independently recall at least 3 joint protection, ergonomics, and body mechanic principles as noted in pt instructions to assist with daily tasks with increased comfort and confidence.  Baseline:  New to outpt OT Goal status: 04/05/24 MET and onoing   4.  Patient will demonstrate no loss of grip strength and/or improvement as needed to minimize dropping objects and manage containers etc. Baseline: Right: 47.5 lbs; Left 42.4 lbs 04/13/24 - RUE: 44.0, 48.0 (46 lbs average). LUE: 36.8, 43.2 (40 lbs average) Goal status: MET   5.  Pt will independently recall sleep positioning options as noted in pt instructions to improve sleep disturbances from moderate to minimal.  Baseline:  mod difficulty per [facility] 04/06/24 - Pt reported sleep positioning have improved. Pt reported continued "moderate difficulty." Pt reported improved sleep quality for the last couple of nights. 04/13/24 - Pt ind recalled sleeping on L side or back, and using pillows for support. Pt continues to report moderate difficulty with sleep per QuickDASH Goal status: not met though pt reporting improved sleep quality and improved understanding of sleep positioning strategies     LONG TERM GOALS: Target date: 04/28/24   Patient will demonstrate updated BUE HEP with visual instruction only for proper execution. Baseline: New to outpt OT 04/13/24 - Pt reported understanding of HEP. OT reviewed HEP today for carryover. Goal status: MET   2.  Pt will independently recall at least 3 options for adaptive equipment to assist with joint  protection, ergonomics, and body mechanic principles as noted in pt instructions  for ADLs and IADLS.  Baseline:  New to outpt OT 04/06/24 - Pt reported using jar openers and built-up handles for eating utensils, and using tall coffee cup to allow pt to hold item with 2 hands, mixing cupcakes using full arm motions to avoid wrist strain, plastic glass in the bathroom, and more frequently used items on lower shelves.  04/13/24 - Pt ind recalled and reported using electric jar opener, adaptive steam mop, splints for support. Pt ind recalled taking breaks during repetitive activities.  Goal status: MET and ongoing   3.  Pt will be independent with home based pain management routine to potentially include gloves/splints, heat and joint protection principles for minimal pain. Baseline: 8-9/10 04/13/24 - Pt reported 3/10 pain. Pt demo'd good carryover and understanding of pain management strategies. Goal status: MET   4.  Patient will demonstrate < 50% disability or less, indicating improved functional use of affected extremity.  Baseline: QuickDash 54.5% 04/06/24- 40.9% deficit 04/13/24 - 31.8% deficit Goal status: MET   ASSESSMENT:  CLINICAL IMPRESSION: Pt has met 4 out of 5 STG and 4 out of 4 LTG to satisfactory levels and is pleased with outcomes. Pt has minimal remaining functional deficits or pain. Pt demo'd good carryover of education. OT recommended to continue to complete HEP and pt verbalized understanding. Pt has all needed materials and education. Pt understands how to continue on with self-management. See tx notes for more details. Patient goals were met. Patient is being discharged due to meeting the stated rehab goals and pt is pleased with current functional level. Pt agreeable to D/C. Pt confirmed no additional questions/concerns related to OT at this time.   PERFORMANCE DEFICITS: in functional skills including ADLs, IADLs, coordination, dexterity, sensation, edema, ROM, strength, pain,  flexibility, Fine motor control, Gross motor control, mobility, balance, body mechanics, endurance, decreased knowledge of precautions, decreased knowledge of use of DME, and UE functional use, cognitive skills including problem solving, safety awareness, and understand, and psychosocial skills including coping strategies, environmental adaptation, and routines and behaviors.   IMPAIRMENTS: are limiting patient from ADLs, IADLs, rest and sleep, and leisure.   CO-MORBIDITIES: may have co-morbidities  that affects occupational performance. Patient will benefit from skilled OT to address above impairments and improve overall function.  REHAB POTENTIAL: Good  PLAN:  OT FREQUENCY: 1-2x/week  OT DURATION: up to 6 weeks with 4 weeks scheduled at this time  PLANNED INTERVENTIONS: 97535 self care/ADL training, 16109 therapeutic exercise, 97530 therapeutic activity, 97112 neuromuscular re-education, 97140 manual therapy, 97035 ultrasound, 97018 paraffin, 60454 fluidotherapy, 97010 moist heat, balance training, visual/perceptual remediation/compensation, energy conservation, coping strategies training, patient/family education, and DME and/or AE instructions  RECOMMENDED OTHER SERVICES: N/A at this time  CONSULTED AND AGREED WITH PLAN OF CARE: Patient and family member/caregiver  PLAN FOR NEXT SESSION:  N/A - OT D/C completed  Oakley Bellman, OT 04/13/2024, 8:41 AM

## 2024-04-19 DIAGNOSIS — H43392 Other vitreous opacities, left eye: Secondary | ICD-10-CM | POA: Diagnosis not present

## 2024-04-19 DIAGNOSIS — H33322 Round hole, left eye: Secondary | ICD-10-CM | POA: Diagnosis not present

## 2024-04-19 DIAGNOSIS — D3131 Benign neoplasm of right choroid: Secondary | ICD-10-CM | POA: Diagnosis not present

## 2024-04-19 DIAGNOSIS — D3132 Benign neoplasm of left choroid: Secondary | ICD-10-CM | POA: Diagnosis not present

## 2024-04-19 DIAGNOSIS — H43813 Vitreous degeneration, bilateral: Secondary | ICD-10-CM | POA: Diagnosis not present

## 2024-05-05 DIAGNOSIS — H33322 Round hole, left eye: Secondary | ICD-10-CM | POA: Diagnosis not present

## 2024-05-16 ENCOUNTER — Other Ambulatory Visit (HOSPITAL_COMMUNITY): Payer: Self-pay

## 2024-05-22 ENCOUNTER — Other Ambulatory Visit: Payer: Self-pay

## 2024-05-27 ENCOUNTER — Other Ambulatory Visit (HOSPITAL_COMMUNITY): Payer: Self-pay

## 2024-05-29 ENCOUNTER — Other Ambulatory Visit: Payer: Self-pay

## 2024-05-31 ENCOUNTER — Other Ambulatory Visit (HOSPITAL_COMMUNITY): Payer: Self-pay

## 2024-06-01 ENCOUNTER — Other Ambulatory Visit (HOSPITAL_COMMUNITY): Payer: Self-pay

## 2024-06-01 ENCOUNTER — Other Ambulatory Visit: Payer: Self-pay

## 2024-06-30 DIAGNOSIS — D3131 Benign neoplasm of right choroid: Secondary | ICD-10-CM | POA: Diagnosis not present

## 2024-06-30 DIAGNOSIS — D3132 Benign neoplasm of left choroid: Secondary | ICD-10-CM | POA: Diagnosis not present

## 2024-06-30 DIAGNOSIS — H43392 Other vitreous opacities, left eye: Secondary | ICD-10-CM | POA: Diagnosis not present

## 2024-06-30 DIAGNOSIS — H31092 Other chorioretinal scars, left eye: Secondary | ICD-10-CM | POA: Diagnosis not present

## 2024-06-30 DIAGNOSIS — H43813 Vitreous degeneration, bilateral: Secondary | ICD-10-CM | POA: Diagnosis not present

## 2024-07-04 ENCOUNTER — Other Ambulatory Visit: Payer: Self-pay | Admitting: Cardiology

## 2024-07-04 ENCOUNTER — Other Ambulatory Visit (HOSPITAL_COMMUNITY): Payer: Self-pay

## 2024-07-04 ENCOUNTER — Other Ambulatory Visit: Payer: Self-pay

## 2024-07-04 MED ORDER — ROSUVASTATIN CALCIUM 5 MG PO TABS
5.0000 mg | ORAL_TABLET | Freq: Every day | ORAL | 0 refills | Status: AC
  Filled 2024-07-04: qty 90, 90d supply, fill #0

## 2024-07-04 MED ORDER — FLUTICASONE PROPIONATE 50 MCG/ACT NA SUSP
2.0000 | Freq: Every day | NASAL | 3 refills | Status: AC
  Filled 2024-07-04: qty 16, 30d supply, fill #0
  Filled 2024-09-03: qty 16, 30d supply, fill #1
  Filled 2024-11-20: qty 16, 30d supply, fill #2

## 2024-07-05 ENCOUNTER — Other Ambulatory Visit: Payer: Self-pay

## 2024-07-11 ENCOUNTER — Other Ambulatory Visit (HOSPITAL_COMMUNITY): Payer: Self-pay

## 2024-07-11 ENCOUNTER — Other Ambulatory Visit: Payer: Self-pay

## 2024-09-03 ENCOUNTER — Other Ambulatory Visit (HOSPITAL_COMMUNITY): Payer: Self-pay

## 2024-09-04 ENCOUNTER — Other Ambulatory Visit (HOSPITAL_COMMUNITY): Payer: Self-pay

## 2024-09-04 ENCOUNTER — Other Ambulatory Visit: Payer: Self-pay

## 2024-09-04 DIAGNOSIS — E1121 Type 2 diabetes mellitus with diabetic nephropathy: Secondary | ICD-10-CM | POA: Diagnosis not present

## 2024-09-04 DIAGNOSIS — N182 Chronic kidney disease, stage 2 (mild): Secondary | ICD-10-CM | POA: Diagnosis not present

## 2024-09-04 DIAGNOSIS — K219 Gastro-esophageal reflux disease without esophagitis: Secondary | ICD-10-CM | POA: Diagnosis not present

## 2024-09-04 DIAGNOSIS — D6851 Activated protein C resistance: Secondary | ICD-10-CM | POA: Diagnosis not present

## 2024-09-04 DIAGNOSIS — M19042 Primary osteoarthritis, left hand: Secondary | ICD-10-CM | POA: Diagnosis not present

## 2024-09-04 DIAGNOSIS — M48061 Spinal stenosis, lumbar region without neurogenic claudication: Secondary | ICD-10-CM | POA: Diagnosis not present

## 2024-09-04 DIAGNOSIS — E782 Mixed hyperlipidemia: Secondary | ICD-10-CM | POA: Diagnosis not present

## 2024-09-04 DIAGNOSIS — M19041 Primary osteoarthritis, right hand: Secondary | ICD-10-CM | POA: Diagnosis not present

## 2024-09-04 DIAGNOSIS — Z9181 History of falling: Secondary | ICD-10-CM | POA: Diagnosis not present

## 2024-09-04 MED ORDER — DICLOFENAC SODIUM 1 % EX GEL
2.0000 g | Freq: Four times a day (QID) | CUTANEOUS | 3 refills | Status: AC
  Filled 2024-09-04: qty 500, 31d supply, fill #0

## 2024-09-04 MED ORDER — DICLOFENAC SODIUM 1 % EX GEL: 2.0000 g | Freq: Four times a day (QID) | CUTANEOUS | 3 refills | Status: AC

## 2024-09-05 ENCOUNTER — Other Ambulatory Visit (HOSPITAL_COMMUNITY): Payer: Self-pay

## 2024-09-05 ENCOUNTER — Other Ambulatory Visit: Payer: Self-pay

## 2024-09-05 ENCOUNTER — Encounter: Payer: Self-pay | Admitting: Pharmacist

## 2024-09-06 ENCOUNTER — Other Ambulatory Visit: Payer: Self-pay

## 2024-10-18 DIAGNOSIS — H5213 Myopia, bilateral: Secondary | ICD-10-CM | POA: Diagnosis not present

## 2024-10-18 DIAGNOSIS — E119 Type 2 diabetes mellitus without complications: Secondary | ICD-10-CM | POA: Diagnosis not present

## 2024-10-18 DIAGNOSIS — H524 Presbyopia: Secondary | ICD-10-CM | POA: Diagnosis not present

## 2024-10-18 DIAGNOSIS — H353131 Nonexudative age-related macular degeneration, bilateral, early dry stage: Secondary | ICD-10-CM | POA: Diagnosis not present

## 2024-10-18 DIAGNOSIS — H52223 Regular astigmatism, bilateral: Secondary | ICD-10-CM | POA: Diagnosis not present

## 2024-10-18 DIAGNOSIS — H40013 Open angle with borderline findings, low risk, bilateral: Secondary | ICD-10-CM | POA: Diagnosis not present

## 2024-11-20 ENCOUNTER — Other Ambulatory Visit (HOSPITAL_COMMUNITY): Payer: Self-pay

## 2024-12-29 ENCOUNTER — Other Ambulatory Visit (HOSPITAL_COMMUNITY): Payer: Self-pay

## 2024-12-29 ENCOUNTER — Other Ambulatory Visit: Payer: Self-pay

## 2024-12-29 MED ORDER — METHYLPREDNISOLONE 4 MG PO TBPK
ORAL_TABLET | ORAL | 0 refills | Status: AC
  Filled 2024-12-29: qty 21, 6d supply, fill #0

## 2024-12-29 MED ORDER — METHOCARBAMOL 500 MG PO TABS
500.0000 mg | ORAL_TABLET | Freq: Three times a day (TID) | ORAL | 0 refills | Status: AC
  Filled 2024-12-29: qty 15, 5d supply, fill #0

## 2025-01-01 ENCOUNTER — Other Ambulatory Visit (HOSPITAL_COMMUNITY): Payer: Self-pay

## 2025-01-03 ENCOUNTER — Other Ambulatory Visit: Payer: Self-pay
